# Patient Record
Sex: Male | Born: 1990 | Race: White | Hispanic: No | Marital: Single
Health system: Southern US, Community
[De-identification: ages and names within clinical notes are randomized; demographics above are authoritative.]

## PROBLEM LIST (undated history)

## (undated) DIAGNOSIS — I368 Other nonrheumatic tricuspid valve disorders: Secondary | ICD-10-CM

## (undated) DIAGNOSIS — F1120 Opioid dependence, uncomplicated: Secondary | ICD-10-CM

## (undated) DIAGNOSIS — F419 Anxiety disorder, unspecified: Secondary | ICD-10-CM

## (undated) DIAGNOSIS — R7881 Bacteremia: Secondary | ICD-10-CM

## (undated) DIAGNOSIS — I071 Rheumatic tricuspid insufficiency: Secondary | ICD-10-CM

## (undated) DIAGNOSIS — I38 Endocarditis, valve unspecified: Secondary | ICD-10-CM

## (undated) DIAGNOSIS — F32A Depression, unspecified: Secondary | ICD-10-CM

## (undated) DIAGNOSIS — B1920 Unspecified viral hepatitis C without hepatic coma: Secondary | ICD-10-CM

## (undated) DIAGNOSIS — I10 Essential (primary) hypertension: Secondary | ICD-10-CM

## (undated) HISTORY — DX: Unspecified viral hepatitis C without hepatic coma: B19.20

## (undated) HISTORY — DX: Rheumatic tricuspid insufficiency: I07.1

## (undated) HISTORY — DX: Bacteremia: R78.81

## (undated) HISTORY — PX: HERNIA REPAIR: SHX51

## (undated) HISTORY — PX: KNEE SURGERY: SHX244

## (undated) HISTORY — DX: Other nonrheumatic tricuspid valve disorders: I36.8

## (undated) HISTORY — DX: Opioid dependence, uncomplicated: F11.20

---
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
## 2016-10-16 ENCOUNTER — Emergency Department (HOSPITAL_COMMUNITY): Payer: Self-pay

## 2016-10-16 ENCOUNTER — Encounter (HOSPITAL_COMMUNITY): Payer: Self-pay

## 2016-10-16 DIAGNOSIS — R0789 Other chest pain: Secondary | ICD-10-CM | POA: Insufficient documentation

## 2016-10-16 DIAGNOSIS — D649 Anemia, unspecified: Secondary | ICD-10-CM | POA: Insufficient documentation

## 2016-10-16 DIAGNOSIS — R21 Rash and other nonspecific skin eruption: Secondary | ICD-10-CM | POA: Insufficient documentation

## 2016-10-16 DIAGNOSIS — F1110 Opioid abuse, uncomplicated: Secondary | ICD-10-CM | POA: Insufficient documentation

## 2016-10-16 LAB — CBC
HCT: 35 % — ABNORMAL LOW (ref 39.0–52.0)
HEMOGLOBIN: 11.6 g/dL — AB (ref 13.0–17.0)
MCH: 28.6 pg (ref 26.0–34.0)
MCHC: 33.1 g/dL (ref 30.0–36.0)
MCV: 86.4 fL (ref 78.0–100.0)
PLATELETS: 302 10*3/uL (ref 150–400)
RBC: 4.05 MIL/uL — AB (ref 4.22–5.81)
RDW: 14 % (ref 11.5–15.5)
WBC: 5.3 10*3/uL (ref 4.0–10.5)

## 2016-10-16 LAB — I-STAT TROPONIN, ED: TROPONIN I, POC: 0 ng/mL (ref 0.00–0.08)

## 2016-10-16 NOTE — ED Triage Notes (Addendum)
Chest pain x 1 week. Endorses sob. Pt also reports rash to abdomen. Pt reports IV heroine use about 2 hours pta

## 2016-10-17 ENCOUNTER — Emergency Department (HOSPITAL_COMMUNITY)
Admission: EM | Admit: 2016-10-17 | Discharge: 2016-10-17 | Disposition: A | Discharge status: Home / Self Care | Payer: Self-pay | Attending: Emergency Medicine | Admitting: Emergency Medicine

## 2016-10-17 DIAGNOSIS — R0789 Other chest pain: Secondary | ICD-10-CM

## 2016-10-17 DIAGNOSIS — R21 Rash and other nonspecific skin eruption: Secondary | ICD-10-CM

## 2016-10-17 DIAGNOSIS — D649 Anemia, unspecified: Secondary | ICD-10-CM

## 2016-10-17 DIAGNOSIS — F1110 Opioid abuse, uncomplicated: Secondary | ICD-10-CM

## 2016-10-17 LAB — BASIC METABOLIC PANEL
Anion gap: 9 (ref 5–15)
BUN: 9 mg/dL (ref 6–20)
CALCIUM: 9.4 mg/dL (ref 8.9–10.3)
CO2: 28 mmol/L (ref 22–32)
CREATININE: 1.05 mg/dL (ref 0.61–1.24)
Chloride: 102 mmol/L (ref 101–111)
Glucose, Bld: 67 mg/dL (ref 65–99)
Potassium: 3.7 mmol/L (ref 3.5–5.1)
SODIUM: 139 mmol/L (ref 135–145)

## 2016-10-17 MED ORDER — DEXAMETHASONE 4 MG PO TABS
10.0000 mg | ORAL_TABLET | Freq: Once | ORAL | Status: AC
  Administered 2016-10-17: 10 mg via ORAL
  Filled 2016-10-17: qty 3

## 2016-10-17 MED ORDER — IBUPROFEN 800 MG PO TABS
800.0000 mg | ORAL_TABLET | Freq: Once | ORAL | Status: AC
  Administered 2016-10-17: 800 mg via ORAL
  Filled 2016-10-17: qty 1

## 2016-10-17 NOTE — ED Notes (Signed)
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

## 2016-10-17 NOTE — ED Provider Notes (Signed)
[facility]-EMER[facility]ENCY DEPT Provider Note   CSN: [id number] Arrival date & time: 10/16/16  2306     History   Chief Complaint Chief Complaint  Patient presents with  . Chest Pain    HPI Cody Rios is a 26 y.o. male.  The history is provided by the patient.  Chest Pain    He complains of pain in the left anterior chest for about the last week. Pain is sharp and worse with movement. He denies any trauma or unusual lifting. He does lift furniture frequently, but there has been no unusual activity. Denies dyspnea, fever, cough. He broke out in a rash yesterday. The rash is present across his abdomen. He was initially pruritic, but pruritus has subsided. He denies any unusual exposures. Of note, he is an intravenous drug abuser using heroin and has been continuing to use it. He does get pain relief when he uses the heroin.  History reviewed. No pertinent past medical history.  There are no active problems to display for this patient.   No past surgical history on file.     Home Medications    Prior to Admission medications   Not on File    Family History No family history on file.  Social History Social History  Substance Use Topics  . Smoking status: Never Smoker  . Smokeless tobacco: Never Used  . Alcohol use No     Allergies   Cephalosporins and Penicillins   Review of Systems Review of Systems  Cardiovascular: Positive for chest pain.  All other systems reviewed and are negative.    Physical Exam Updated Vital Signs BP 130/89 (BP Location: Left Arm)   Pulse 71   Temp 97.8 F (36.6 C) (Oral)   Resp 16   Ht 6\' 2"  (1.88 m)   Wt 72.6 kg (160 lb)   SpO2 99%   BMI 20.54 kg/m   Physical Exam  Nursing note and vitals reviewed.  26 year old male, resting comfortably and in no acute distress. Vital signs are normal. Oxygen saturation is 99%, which is normal. Head is normocephalic and atraumatic. PERRLA, EOMI. Oropharynx is clear. Neck is nontender  and supple without adenopathy or JVD. Back is nontender and there is no CVA tenderness. Lungs are clear without rales, wheezes, or rhonchi. Chest is moderately tender in the left anterior chest wall. There is no point tenderness. There is no crepitus. Heart has regular rate and rhythm without murmur. Abdomen is soft, flat, nontender without masses or hepatosplenomegaly and peristalsis is normoactive. Extremities have no cyanosis or edema, full range of motion is present. Skin is warm and dry. Erythematous papular rash is present across the upper abdomen. Appearance is nonspecific. Neurologic: Mental status is normal, cranial nerves are intact, there are no motor or sensory deficits.  ED Treatments / Results  Labs (all labs ordered are listed, but only abnormal results are displayed) Labs Reviewed  CBC - Abnormal; Notable for the following:       Result Value   RBC 4.05 (*)    Hemoglobin 11.6 (*)    HCT 35.0 (*)    All other components within normal limits  BASIC METABOLIC PANEL  I-STAT TROPOININ, ED    EKG  EKG Interpretation  Date/Time:  Sunday October 16 2016 23:11:18 EDT Ventricular Rate:  82 PR Interval:  118 QRS Duration: 94 QT Interval:  376 QTC Calculation: 439 R Axis:   88 Text Interpretation:  Normal sinus rhythm Incomplete right bundle branch block Left  ventricular hypertrophy Abnormal ECG No old tracing to compare Confirmed by Dione BoozeGlick, Jennefer Kopp ([id number]) on 10/16/2016 11:40:00 PM       Radiology Dg Chest 2 View  Result Date: 10/16/2016 CLINICAL DATA:  26 year old male with chest pain. EXAM: CHEST  2 VIEW COMPARISON:  Chest radiograph dated 03/21/2005 FINDINGS: The heart size and mediastinal contours are within normal limits. Both lungs are clear. The visualized skeletal structures are unremarkable. IMPRESSION: No active cardiopulmonary disease. Electronically Signed   By: Elgie CollardArash  Radparvar M.D.   On: 10/16/2016 23:29    Procedures Procedures (including critical care  time)  Medications Ordered in ED Medications  dexamethasone (DECADRON) tablet 10 mg (not administered)  ibuprofen (ADVIL,MOTRIN) tablet 800 mg (not administered)     Initial Impression / Assessment and Plan / ED Course  I have reviewed the triage vital signs and the nursing notes.  Pertinent labs & imaging results that were available during my care of the patient were reviewed by me and considered in my medical decision making (see chart for details).  Chest pain which is clearly musculoskeletal in origin. No red flags to suggest more serious causes. Rash is nonspecific. I suspect this is a viral exanthem. Laboratory workup did show mild anemia. On review of records, in Care Everywhere, hemoglobin was noted to be [facility] at [facility] one year ago. He is advised using over-the-counter naproxen. He is given a single dose of dexamethasone. Given history of drug abuse, no narcotics are given. He is given Psychologist, clinicalresource guides for financial assistance, and drug rehabilitation programs.  Final Clinical Impressions(s) / ED Diagnoses   Final diagnoses:  Chest wall pain  Papular rash  Heroin abuse  Normochromic normocytic anemia    New Prescriptions New Prescriptions   No medications on file     Dione BoozeGlick, Korin Hartwell, MD 10/17/16 0210

## 2016-10-17 NOTE — ED Notes (Signed)
Patient was called 2x with out answer.  Patients friend walked in from outside stated he was outside for a minute and would be back in a few minutes.

## 2016-10-17 NOTE — Discharge Instructions (Signed)
Take naproxen - two tablets at a time, twice a day. Use ice and/or heat as needed. Do not use acetaminophen (Tylenol) because it can harm your liver. Return if symptoms are getting worse.

## 2016-11-04 ENCOUNTER — Encounter (HOSPITAL_COMMUNITY): Payer: Self-pay | Admitting: Emergency Medicine

## 2016-11-04 ENCOUNTER — Emergency Department (HOSPITAL_COMMUNITY): Payer: Self-pay

## 2016-11-04 ENCOUNTER — Emergency Department (HOSPITAL_COMMUNITY)
Admission: EM | Admit: 2016-11-04 | Discharge: 2016-11-04 | Disposition: A | Discharge status: Home / Self Care | Payer: Self-pay | Attending: Emergency Medicine | Admitting: Emergency Medicine

## 2016-11-04 DIAGNOSIS — R079 Chest pain, unspecified: Secondary | ICD-10-CM | POA: Insufficient documentation

## 2016-11-04 DIAGNOSIS — R911 Solitary pulmonary nodule: Secondary | ICD-10-CM | POA: Insufficient documentation

## 2016-11-04 LAB — BASIC METABOLIC PANEL
ANION GAP: 9 (ref 5–15)
BUN: 14 mg/dL (ref 6–20)
CALCIUM: 8.4 mg/dL — AB (ref 8.9–10.3)
CO2: 27 mmol/L (ref 22–32)
CREATININE: 0.85 mg/dL (ref 0.61–1.24)
Chloride: 97 mmol/L — ABNORMAL LOW (ref 101–111)
GFR calc Af Amer: >60 mL/min (ref >60)
GLUCOSE: 158 mg/dL — AB (ref 65–99)
Potassium: 3.9 mmol/L (ref 3.5–5.1)
Sodium: 133 mmol/L — ABNORMAL LOW (ref 135–145)

## 2016-11-04 LAB — I-STAT TROPONIN, ED: TROPONIN I, POC: 0 ng/mL (ref 0.00–0.08)

## 2016-11-04 LAB — CBC
HCT: 36 % — ABNORMAL LOW (ref 39.0–52.0)
HEMOGLOBIN: 12 g/dL — AB (ref 13.0–17.0)
MCH: 28.3 pg (ref 26.0–34.0)
MCHC: 33.3 g/dL (ref 30.0–36.0)
MCV: 84.9 fL (ref 78.0–100.0)
PLATELETS: 178 10*3/uL (ref 150–400)
RBC: 4.24 MIL/uL (ref 4.22–5.81)
RDW: 14.4 % (ref 11.5–15.5)
WBC: 9.7 10*3/uL (ref 4.0–10.5)

## 2016-11-04 LAB — D-DIMER, QUANTITATIVE: D-Dimer, Quant: 1.76 ug/mL-FEU — ABNORMAL HIGH (ref 0.00–0.50)

## 2016-11-04 MED ORDER — IBUPROFEN 800 MG PO TABS
800.0000 mg | ORAL_TABLET | Freq: Once | ORAL | Status: AC
  Administered 2016-11-04: 800 mg via ORAL
  Filled 2016-11-04: qty 1

## 2016-11-04 MED ORDER — KETOROLAC TROMETHAMINE 30 MG/ML IJ SOLN
15.0000 mg | Freq: Once | INTRAMUSCULAR | Status: AC
  Administered 2016-11-04: 15 mg via INTRAVENOUS
  Filled 2016-11-04: qty 1

## 2016-11-04 MED ORDER — AZITHROMYCIN 250 MG PO TABS: ORAL_TABLET | ORAL | 0 refills | Status: DC

## 2016-11-04 MED ORDER — LORAZEPAM 2 MG/ML IJ SOLN
1.0000 mg | Freq: Once | INTRAMUSCULAR | Status: AC
  Administered 2016-11-04: 1 mg via INTRAVENOUS
  Filled 2016-11-04: qty 1

## 2016-11-04 MED ORDER — IOPAMIDOL (ISOVUE-370) INJECTION 76%
60.0000 mL | Freq: Once | INTRAVENOUS | Status: AC | PRN
  Administered 2016-11-04: 60 mL via INTRAVENOUS

## 2016-11-04 MED ORDER — NAPROXEN 500 MG PO TABS: 500.0000 mg | ORAL_TABLET | Freq: Two times a day (BID) | ORAL | 0 refills | Status: DC

## 2016-11-04 MED ORDER — IOPAMIDOL (ISOVUE-370) INJECTION 76%
INTRAVENOUS | Status: AC
  Filled 2016-11-04: qty 100

## 2016-11-04 NOTE — ED Provider Notes (Signed)
Pt was seen by Dr Bebe ShaggyWickline initially. Please see his note.  Patient's CT scan results show a 7 mm nodule most likely infectious or inflammatory. Follow-up CT scan recommended in 6-12 months. No signs of pulmonary embolism, aneurysm or dissection.  Pt states he did have a fever last night.  Will rx abx.  Discussed need for repeat CT scan imaging.   Cody Rios, Cody Mcnamee, MD 11/04/16 [phone]

## 2016-11-04 NOTE — ED Notes (Signed)
Patient A/O x4 and ambulatory upon depature. Patient verbalized understanding of discharge instructions, follow up care, and medications. All belongings with patient upon departure.

## 2016-11-04 NOTE — ED Notes (Signed)
Patient transported to CT 

## 2016-11-04 NOTE — ED Notes (Signed)
RN attempted three IV stick, not successful

## 2016-11-04 NOTE — Discharge Instructions (Signed)
Take the medications for the pain and possible infection, follow up with a primary care doctor next week.  As we discussed, A lung nodule was seen on the CT scan.  The radiologist recommends a repeat CT scan in 6-12 months to make sure it is not growing in size.

## 2016-11-04 NOTE — ED Notes (Signed)
Pt updates, now resting quietly.

## 2016-11-04 NOTE — ED Notes (Signed)
Pt returned to room from xray.

## 2016-11-04 NOTE — ED Provider Notes (Signed)
[facility]PT Provider Note   CSN: [id number] Arrival date & time: 11/04/16  0407     History   Chief Complaint Chief Complaint  Patient presents with  . Shortness of Breath  . Chest Pain    HPI Cody Rios is a 26 y.o. male.  The history is provided by the patient.  Chest Pain   This is a new problem. The current episode started 6 to 12 hours ago. The problem occurs constantly. The problem has been gradually worsening. The pain is associated with breathing, movement and raising an arm. The pain is present in the substernal region. The pain is moderate. The quality of the pain is described as sharp. The pain does not radiate. Associated symptoms include shortness of breath. Pertinent negatives include no cough, no fever and no vomiting. Treatments tried: NSAIDs.  patient presents with chest pain He reports starting last night he had onset of chest pain ,worse with breathing and also palpation No fever/vomiting He report SOB No cough No trauma  He admits to heroin IVDA, last use just prior to CP starting No h/o PE  PMH - heroin abuse Past Surgical History:  Procedure Laterality Date  . HERNIA REPAIR    . KNEE SURGERY         Home Medications    Prior to Admission medications   Not on File    Family History History reviewed. No pertinent family history.  Social History Social History  Substance Use Topics  . Smoking status: Never Smoker  . Smokeless tobacco: Never Used  . Alcohol use No     Allergies   Cephalosporins and Penicillins   Review of Systems Review of Systems  Constitutional: Negative for fever.  Respiratory: Positive for shortness of breath. Negative for cough.   Cardiovascular: Positive for chest pain. Negative for leg swelling.  Gastrointestinal: Negative for vomiting.  Psychiatric/Behavioral: The patient is nervous/anxious.   All other systems reviewed and are negative.    Physical Exam Updated Vital Signs BP  129/78 (BP Location: Right Arm)   Pulse (!) 107   Temp 98.1 F (36.7 C) (Oral)   Resp 19   Ht 1.88 m (6\' 2" )   Wt 72.6 kg (160 lb)   SpO2 100%   BMI 20.54 kg/m   Physical Exam CONSTITUTIONAL: Anxious HEAD: Normocephalic/atraumatic EYES: EOMI/PERRL ENMT: Mucous membranes moist NECK: supple no meningeal signs SPINE/BACK:entire spine nontender CV: S1/S2 noted, no murmurs/rubs/gallops noted, no loud/harsh murmurs noted, tachycardic LUNGS: Lungs are clear to auscultation bilaterally, no apparent distress Chest- diffuse chest wall tenderness, no bruising or crepitus noted ABDOMEN: soft, nontender NEURO: Pt is awake/alert/appropriate, moves all extremitiesx4.  No facial droop.   EXTREMITIES: pulses normal/equal, full ROM, no LE edema noted SKIN: warm, color normal PSYCH: anxious  ED Treatments / Results  Labs (all labs ordered are listed, but only abnormal results are displayed) Labs Reviewed  CBC - Abnormal; Notable for the following:       Result Value   Hemoglobin 12.0 (*)    HCT 36.0 (*)    All other components within normal limits  BASIC METABOLIC PANEL  D-DIMER, QUANTITATIVE (NOT AT [facility])  I-STAT TROPONIN, ED    EKG  EKG Interpretation  Date/Time:  Friday November 04 2016 04:12:39 EDT Ventricular Rate:  100 PR Interval:    QRS Duration: 100 QT Interval:  320 QTC Calculation: 413 R Axis:   90 Text Interpretation:  Sinus tachycardia Consider right ventricular hypertrophy Probable left ventricular hypertrophy Confirmed  by Zadie RhineWickline, Adair Lauderback [phone]([id number]) on 11/04/2016 4:18:19 AM       Radiology Dg Chest 2 View  Result Date: 11/04/2016 CLINICAL DATA:  Chest pain after IV drug use at 10 p.m. EXAM: CHEST  2 VIEW COMPARISON:  10/16/2016 FINDINGS: Shallow inspiration. The heart size and mediastinal contours are within normal limits. Both lungs are clear. The visualized skeletal structures are unremarkable. IMPRESSION: No active cardiopulmonary disease. Electronically Signed   By:  Burman NievesWilliam  Stevens M.D.   On: 11/04/2016 04:50    Procedures Procedures (including critical care time)  Medications Ordered in ED Medications  ibuprofen (ADVIL,MOTRIN) tablet 800 mg (800 mg Oral Given 11/04/16 0523)  ketorolac (TORADOL) 30 MG/ML injection 15 mg (15 mg Intravenous Given 11/04/16 0631)  LORazepam (ATIVAN) injection 1 mg (1 mg Intravenous Given 11/04/16 0641)     Initial Impression / Assessment and Plan / ED Course  I have reviewed the triage vital signs and the nursing notes.  Pertinent labs & imaging results that were available during my care of the patient were reviewed by me and considered in my medical decision making (see chart for details).     5:16 AM Pt with known h/o IVDA presents with CP It is reproducible, however pt is clearly in pain and unreliable He is tachycardic He had recent ED evaluation Will proceed with D-dimer to evaluate for PE 7:19 AM D-dimer elevated Pt tachycardic Will proceed with CT chest D/w dr Cletis Athensjon knapp at signout If CT imaging negative he may be discharged with resources for outpatient detox   Final Clinical Impressions(s) / ED Diagnoses   Final diagnoses:  None    New Prescriptions New Prescriptions   No medications on file     Zadie RhineWickline, Rivers Hamrick, MD 11/04/16 [phone]

## 2016-11-04 NOTE — ED Triage Notes (Signed)
Pt c/o 10/10 left side cp since last night at 22:00, heroine abuser, last use 2130 last night, states he is having cp and feeling cold all over.

## 2016-11-07 ENCOUNTER — Encounter (HOSPITAL_COMMUNITY): Payer: Self-pay | Admitting: Emergency Medicine

## 2016-11-07 ENCOUNTER — Emergency Department (HOSPITAL_COMMUNITY): Payer: Self-pay

## 2016-11-07 ENCOUNTER — Emergency Department (HOSPITAL_COMMUNITY)
Admission: EM | Admit: 2016-11-07 | Discharge: 2016-11-08 | Disposition: A | Discharge status: Home / Self Care | Payer: Self-pay | Attending: Emergency Medicine | Admitting: Emergency Medicine

## 2016-11-07 DIAGNOSIS — R609 Edema, unspecified: Secondary | ICD-10-CM | POA: Insufficient documentation

## 2016-11-07 DIAGNOSIS — R0789 Other chest pain: Secondary | ICD-10-CM | POA: Insufficient documentation

## 2016-11-07 DIAGNOSIS — R072 Precordial pain: Secondary | ICD-10-CM | POA: Insufficient documentation

## 2016-11-07 LAB — CBC
HCT: 31.4 % — ABNORMAL LOW (ref 39.0–52.0)
HEMOGLOBIN: 11.1 g/dL — AB (ref 13.0–17.0)
MCH: 29.1 pg (ref 26.0–34.0)
MCHC: 35.4 g/dL (ref 30.0–36.0)
MCV: 82.4 fL (ref 78.0–100.0)
PLATELETS: 193 10*3/uL (ref 150–400)
RBC: 3.81 MIL/uL — AB (ref 4.22–5.81)
RDW: 14.8 % (ref 11.5–15.5)
WBC: 12.9 10*3/uL — AB (ref 4.0–10.5)

## 2016-11-07 LAB — BASIC METABOLIC PANEL
ANION GAP: 9 (ref 5–15)
BUN: 15 mg/dL (ref 6–20)
CALCIUM: 8.6 mg/dL — AB (ref 8.9–10.3)
CHLORIDE: 99 mmol/L — AB (ref 101–111)
CO2: 26 mmol/L (ref 22–32)
CREATININE: 0.66 mg/dL (ref 0.61–1.24)
GFR calc non Af Amer: >60 mL/min (ref >60)
Glucose, Bld: 117 mg/dL — ABNORMAL HIGH (ref 65–99)
Potassium: 3.7 mmol/L (ref 3.5–5.1)
SODIUM: 134 mmol/L — AB (ref 135–145)

## 2016-11-07 LAB — POCT I-STAT TROPONIN I: TROPONIN I, POC: 0 ng/mL (ref 0.00–0.08)

## 2016-11-07 NOTE — ED Notes (Signed)
Pt refused chest x-ray

## 2016-11-07 NOTE — ED Triage Notes (Addendum)
Pt c/o chest pain, bilateral pedal edema, bilateral numbness to toes, intermittent SOB and palpitations onset 2 days ago, better today than 2 days ago. Current IV drug user, last use of heroine IV was at 1630.   Pt [facility] for [facility], was diagnosed with chest wall pain. Was also seen in ED 2 days ago for left CP, diagnosed with pleurisy.  Pain worse with movement, not changed with position between sitting back or leaning forward. S1, S2 without murmur, rub, gallop. No lesions to palms or nails.

## 2016-11-08 ENCOUNTER — Telehealth (HOSPITAL_BASED_OUTPATIENT_CLINIC_OR_DEPARTMENT_OTHER): Payer: Self-pay | Admitting: Emergency Medicine

## 2016-11-08 ENCOUNTER — Telehealth: Payer: Self-pay | Admitting: Infectious Disease

## 2016-11-08 DIAGNOSIS — F17220 Nicotine dependence, chewing tobacco, uncomplicated: Secondary | ICD-10-CM | POA: Diagnosis present

## 2016-11-08 DIAGNOSIS — R7881 Bacteremia: Secondary | ICD-10-CM | POA: Diagnosis present

## 2016-11-08 DIAGNOSIS — F1120 Opioid dependence, uncomplicated: Secondary | ICD-10-CM | POA: Diagnosis present

## 2016-11-08 DIAGNOSIS — Z881 Allergy status to other antibiotic agents status: Secondary | ICD-10-CM

## 2016-11-08 DIAGNOSIS — M25531 Pain in right wrist: Secondary | ICD-10-CM | POA: Diagnosis present

## 2016-11-08 DIAGNOSIS — I272 Pulmonary hypertension, unspecified: Secondary | ICD-10-CM | POA: Diagnosis present

## 2016-11-08 DIAGNOSIS — M659 Synovitis and tenosynovitis, unspecified: Secondary | ICD-10-CM | POA: Diagnosis present

## 2016-11-08 DIAGNOSIS — B9562 Methicillin resistant Staphylococcus aureus infection as the cause of diseases classified elsewhere: Secondary | ICD-10-CM | POA: Diagnosis present

## 2016-11-08 DIAGNOSIS — Z791 Long term (current) use of non-steroidal anti-inflammatories (NSAID): Secondary | ICD-10-CM

## 2016-11-08 DIAGNOSIS — Z880 Allergy status to penicillin: Secondary | ICD-10-CM

## 2016-11-08 DIAGNOSIS — M25561 Pain in right knee: Secondary | ICD-10-CM | POA: Diagnosis present

## 2016-11-08 DIAGNOSIS — I2690 Septic pulmonary embolism without acute cor pulmonale: Secondary | ICD-10-CM | POA: Diagnosis present

## 2016-11-08 DIAGNOSIS — R5381 Other malaise: Secondary | ICD-10-CM | POA: Diagnosis present

## 2016-11-08 DIAGNOSIS — J189 Pneumonia, unspecified organism: Secondary | ICD-10-CM | POA: Diagnosis present

## 2016-11-08 DIAGNOSIS — M0800 Unspecified juvenile rheumatoid arthritis of unspecified site: Secondary | ICD-10-CM | POA: Diagnosis present

## 2016-11-08 DIAGNOSIS — I330 Acute and subacute infective endocarditis: Principal | ICD-10-CM | POA: Diagnosis present

## 2016-11-08 LAB — BLOOD CULTURE ID PANEL (REFLEXED)

## 2016-11-08 MED ORDER — IBUPROFEN 800 MG PO TABS: 800.0000 mg | ORAL_TABLET | Freq: Three times a day (TID) | ORAL | 0 refills | Status: DC | PRN

## 2016-11-08 NOTE — Discharge Instructions (Signed)
You will need to follow-up with the clinic provided.  Return here as needed.  Your testing here tonight did not show any significant abnormalities.  We did draw blood cultures, which we will call you if there is any abnormalities

## 2016-11-08 NOTE — Telephone Encounter (Signed)
Patient with MSSAB  He needs to be admitted to the hospital  GIven his cephalosporin and penicllin allergies I would investigate nature of these and if felt safe would prefer to treat him with beta lactam such as Cefazolin (his reaction is listed as N and V)  If not felt safe would use vancomycin  I have added him to the [facility] and [facility] Drue SecondSNider will round on him tomorrow  IF he comes to [facility]        Antimicrobial Management Team Staphylococcus aureus bacteremia   Staphylococcus aureus bacteremia (SAB) is associated with a high rate of complications and mortality.  Specific aspects of clinical management are critical to optimizing the outcome of patients with SAB.  Therefore, the [facility] Antimicrobial Management Team [facility]) has initiated an intervention aimed at improving the management of SAB at [facility].  To do so, Infectious Diseases physicians are providing an evidence-based consult for the management of all patients with SAB.     Yes No Comments  Perform follow-up blood cultures (even if the patient is afebrile) to ensure clearance of bacteremia [x]  []  Start after 24 hours of being on effective abx  Remove vascular catheter and obtain follow-up blood cultures after the removal of the catheter [x]  []    Perform echocardiography to evaluate for endocarditis (transthoracic ECHO is 40-50% sensitive, TEE is > 90% sensitive) [x]  []  Please keep in mind, that neither test can definitively EXCLUDE endocarditis, and that should clinical suspicion remain high for endocarditis the patient should then still be treated with an "endocarditis" duration of therapy = 6 weeks  Consult electrophysiologist to evaluate implanted cardiac device (pacemaker, ICD) []  []    Ensure source control [x]  []  Have all abscesses been drained effectively? Have deep seeded infections (septic joints or osteomyelitis) had appropriate surgical  debridement?  Investigate for "metastatic" sites of infection [x]  []  Does the patient have ANY symptom or physical exam finding that would suggest a deeper infection (back or neck pain that may be suggestive of vertebral osteomyelitis or epidural abscess, muscle pain that could be a symptom of pyomyositis)?  Keep in mind that for deep seeded infections MRI imaging with contrast is preferred rather than other often insensitive tests such as plain x-rays, especially early in a patient's presentation.  Change antibiotic therapy to cefazolin IF possible otherwise IV vancomycin [x]  []  Beta-lactam antibiotics are preferred for MSSA due to higher cure rates.   If on Vancomycin, goal trough should be 15 - 20 mcg/mL  Estimated duration of IV antibiotic therapy:  2-6 weeks []  []  Consult case management for probably prolonged outpatient IV antibiotic therapy

## 2016-11-08 NOTE — Telephone Encounter (Signed)
Pt returned call, 2 pt identifiers used, pt made aware of blood culture that was positive for staph. Pt instructed to go to ED promptly for evaluation. Pt verbalized understanding.

## 2016-11-08 NOTE — ED Provider Notes (Addendum)
[facility]-EMERGENCY DEPT Provider Note   CSN: [id number] Arrival date & time: 11/07/16  1741     History   Chief Complaint Chief Complaint  Patient presents with  . Chest Pain  . Leg Swelling  . Numbness    HPI Cody Rios is a 26 y.o. male.  HPI Patient presents to the emergency department with Chest discomfort that has been worsening over the last few weeks.  Patient states he was seen at [facility] Saturday where they did a CT scan of his chest and laboratory testing.  He says he is discharged home with Zithromax.  The patient states that he is an IV drug user, last usage was tonight around 8 PM.  Patient also had intermittent arthralgias and different joints along with some swelling in his ankles.  He states she has also had intermittent fevers.  The patient denies shortness of breath, headache,blurred vision, neck pain, cough, weakness, numbness, dizziness, anorexia, edema, abdominal pain, nausea, vomiting, diarrhea, rash, back pain, dysuria, hematemesis, bloody stool, near syncope, or syncope. History reviewed. No pertinent past medical history.  There are no active problems to display for this patient.   Past Surgical History:  Procedure Laterality Date  . HERNIA REPAIR    . KNEE SURGERY         Home Medications    Prior to Admission medications   Medication Sig Start Date End Date Taking? Authorizing Provider  azithromycin (ZITHROMAX) 250 MG tablet Take first 2 tablets together, then 1 every day until finished. 11/04/16   Linwood Dibbles, MD  naproxen (NAPROSYN) 500 MG tablet Take 1 tablet (500 mg total) by mouth 2 (two) times daily. 11/04/16   Linwood Dibbles, MD    Family History History reviewed. No pertinent family history.  Social History Social History  Substance Use Topics  . Smoking status: Never Smoker  . Smokeless tobacco: Never Used  . Alcohol use No     Allergies   Cephalosporins and Penicillins   Review of Systems Review of Systems  All other  systems negative except as documented in the HPI. All pertinent positives and negatives as reviewed in the HPI. Physical Exam Updated Vital Signs BP 101/76 (BP Location: Right Arm)   Pulse (!) 105   Temp 98.6 F (37 C) (Oral)   Resp 12   Ht 6\' 2"  (1.88 m)   Wt 70.3 kg (155 lb)   SpO2 96%   BMI 19.90 kg/m   Physical Exam  Constitutional: He is oriented to person, place, and time. He appears well-developed and well-nourished. No distress.  HENT:  Head: Normocephalic and atraumatic.  Mouth/Throat: Oropharynx is clear and moist.  Eyes: Pupils are equal, round, and reactive to light.  Neck: Normal range of motion. Neck supple.  Cardiovascular: Normal rate, regular rhythm and normal heart sounds.  Exam reveals no gallop and no friction rub.   No murmur heard. Pulmonary/Chest: Effort normal and breath sounds normal. No respiratory distress. He has no wheezes.  Abdominal: Soft. Bowel sounds are normal. He exhibits no distension. There is no tenderness.  Musculoskeletal:       Hands: Neurological: He is alert and oriented to person, place, and time. He exhibits normal muscle tone. Coordination normal.  Skin: Skin is warm and dry. Capillary refill takes less than 2 seconds. No rash noted. No erythema.  Psychiatric: He has a normal mood and affect. His behavior is normal.  Nursing note and vitals reviewed.    ED Treatments / Results  Labs (  all labs ordered are listed, but only abnormal results are displayed) Labs Reviewed  BASIC METABOLIC PANEL - Abnormal; Notable for the following:       Result Value   Sodium 134 (*)    Chloride 99 (*)    Glucose, Bld 117 (*)    Calcium 8.6 (*)    All other components within normal limits  CBC - Abnormal; Notable for the following:    WBC 12.9 (*)    RBC 3.81 (*)    Hemoglobin 11.1 (*)    HCT 31.4 (*)    All other components within normal limits  CULTURE, BLOOD (ROUTINE X 2)  CULTURE, BLOOD (ROUTINE X 2)  I-STAT TROPONIN, ED  POCT I-STAT  TROPONIN I    EKG  EKG Interpretation  Date/Time:  Monday November 07 2016 17:56:25 EDT Ventricular Rate:  113 PR Interval:    QRS Duration: 93 QT Interval:  309 QTC Calculation: 424 R Axis:   92 Text Interpretation:  Sinus tachycardia Consider right ventricular hypertrophy LVH by voltage No significant change since last tracing Confirmed by Shaune PollackIsaacs, Cameron [phone]([id number] on 11/08/2016 12:10:44 AM       Radiology No results found.  Procedures Procedures (including critical care time)  Medications Ordered in ED Medications - No data to display   Initial Impression / Assessment and Plan / ED Course  I have reviewed the triage vital signs and the nursing notes.  Pertinent labs & imaging results that were available during my care of the patient were reviewed by me and considered in my medical decision making (see chart for details).    Patient other than tachycardia has been stable here in the emergency department.  He is tachycardic on his previous visit as well.  There is no new murmur at this time.  Patient is afebrile.  Patient is tolerating oral fluids.  I did send blood cultures due to the fact that there is a chance that he could have endocarditis, but at this time does not show any signs of significant impairment. I felt possible admission was warranted due to his tachycardia and IV drug use awaiting cultures. I spoke with the Hospitalist for admission and he felt at this time admission was not warranted and discharge home was appropriate waiting for cultures and outpatient follow up.  Final Clinical Impressions(s) / ED Diagnoses   Final diagnoses:  None    New Prescriptions New Prescriptions   No medications on file     [name], Malaijah Houchen, Cordelia Poche-C 11/08/16 0044    [name], Brylie Sneath, PA-C 11/08/16 [phone]    Shaune PollackIsaacs, Cameron, MD 11/08/16 1120    Shaune PollackIsaacs, Cameron, MD 11/09/16 1115    [name], Sunshyne Horvath, PA-C 11/09/16 [phone]

## 2016-11-09 ENCOUNTER — Inpatient Hospital Stay (HOSPITAL_COMMUNITY): Payer: Self-pay

## 2016-11-09 ENCOUNTER — Emergency Department (HOSPITAL_COMMUNITY): Payer: Self-pay

## 2016-11-09 ENCOUNTER — Other Ambulatory Visit: Payer: Self-pay

## 2016-11-09 ENCOUNTER — Inpatient Hospital Stay (HOSPITAL_COMMUNITY)
Admission: EM | Admit: 2016-11-09 | Discharge: 2016-11-09 | DRG: 288 | Discharge status: Against Medical Advice | Payer: Self-pay | Attending: Internal Medicine | Admitting: Internal Medicine

## 2016-11-09 ENCOUNTER — Encounter (HOSPITAL_COMMUNITY): Payer: Self-pay | Admitting: Emergency Medicine

## 2016-11-09 ENCOUNTER — Encounter (HOSPITAL_COMMUNITY): Payer: Self-pay | Admitting: Family Medicine

## 2016-11-09 DIAGNOSIS — I330 Acute and subacute infective endocarditis: Principal | ICD-10-CM

## 2016-11-09 DIAGNOSIS — J189 Pneumonia, unspecified organism: Secondary | ICD-10-CM

## 2016-11-09 DIAGNOSIS — M65831 Other synovitis and tenosynovitis, right forearm: Secondary | ICD-10-CM | POA: Diagnosis present

## 2016-11-09 DIAGNOSIS — M659 Synovitis and tenosynovitis, unspecified: Secondary | ICD-10-CM

## 2016-11-09 DIAGNOSIS — Z6820 Body mass index (BMI) 20.0-20.9, adult: Secondary | ICD-10-CM

## 2016-11-09 DIAGNOSIS — D649 Anemia, unspecified: Secondary | ICD-10-CM | POA: Diagnosis present

## 2016-11-09 DIAGNOSIS — B1920 Unspecified viral hepatitis C without hepatic coma: Secondary | ICD-10-CM | POA: Diagnosis present

## 2016-11-09 DIAGNOSIS — B9561 Methicillin susceptible Staphylococcus aureus infection as the cause of diseases classified elsewhere: Secondary | ICD-10-CM

## 2016-11-09 DIAGNOSIS — I76 Septic arterial embolism: Secondary | ICD-10-CM | POA: Diagnosis present

## 2016-11-09 DIAGNOSIS — N390 Urinary tract infection, site not specified: Secondary | ICD-10-CM | POA: Diagnosis present

## 2016-11-09 DIAGNOSIS — Z880 Allergy status to penicillin: Secondary | ICD-10-CM

## 2016-11-09 DIAGNOSIS — I272 Pulmonary hypertension, unspecified: Secondary | ICD-10-CM | POA: Diagnosis present

## 2016-11-09 DIAGNOSIS — E46 Unspecified protein-calorie malnutrition: Secondary | ICD-10-CM | POA: Diagnosis present

## 2016-11-09 DIAGNOSIS — Z79899 Other long term (current) drug therapy: Secondary | ICD-10-CM

## 2016-11-09 DIAGNOSIS — I071 Rheumatic tricuspid insufficiency: Secondary | ICD-10-CM | POA: Diagnosis present

## 2016-11-09 DIAGNOSIS — F17220 Nicotine dependence, chewing tobacco, uncomplicated: Secondary | ICD-10-CM | POA: Diagnosis present

## 2016-11-09 DIAGNOSIS — M868X7 Other osteomyelitis, ankle and foot: Secondary | ICD-10-CM | POA: Diagnosis present

## 2016-11-09 DIAGNOSIS — M0800 Unspecified juvenile rheumatoid arthritis of unspecified site: Secondary | ICD-10-CM | POA: Diagnosis present

## 2016-11-09 DIAGNOSIS — Z888 Allergy status to other drugs, medicaments and biological substances status: Secondary | ICD-10-CM

## 2016-11-09 DIAGNOSIS — Z881 Allergy status to other antibiotic agents status: Secondary | ICD-10-CM

## 2016-11-09 DIAGNOSIS — J188 Other pneumonia, unspecified organism: Secondary | ICD-10-CM | POA: Diagnosis present

## 2016-11-09 DIAGNOSIS — I10 Essential (primary) hypertension: Secondary | ICD-10-CM | POA: Diagnosis present

## 2016-11-09 DIAGNOSIS — R7881 Bacteremia: Secondary | ICD-10-CM | POA: Diagnosis present

## 2016-11-09 DIAGNOSIS — I2690 Septic pulmonary embolism without acute cor pulmonale: Secondary | ICD-10-CM | POA: Diagnosis present

## 2016-11-09 DIAGNOSIS — F1123 Opioid dependence with withdrawal: Secondary | ICD-10-CM | POA: Diagnosis present

## 2016-11-09 DIAGNOSIS — R52 Pain, unspecified: Secondary | ICD-10-CM

## 2016-11-09 LAB — BASIC METABOLIC PANEL
ANION GAP: 7 (ref 5–15)
BUN: 6 mg/dL (ref 6–20)
CHLORIDE: 101 mmol/L (ref 101–111)
CO2: 25 mmol/L (ref 22–32)
CREATININE: 0.59 mg/dL — AB (ref 0.61–1.24)
Calcium: 8 mg/dL — ABNORMAL LOW (ref 8.9–10.3)
GFR calc non Af Amer: >60 mL/min (ref >60)
Glucose, Bld: 115 mg/dL — ABNORMAL HIGH (ref 65–99)
POTASSIUM: 4 mmol/L (ref 3.5–5.1)
Sodium: 133 mmol/L — ABNORMAL LOW (ref 135–145)

## 2016-11-09 LAB — CBC WITH DIFFERENTIAL/PLATELET
BASOS PCT: 0 %
Basophils Absolute: 0 10*3/uL (ref 0.0–0.1)
EOS ABS: 0.2 10*3/uL (ref 0.0–0.7)
Eosinophils Relative: 1 %
HCT: 27.9 % — ABNORMAL LOW (ref 39.0–52.0)
HEMOGLOBIN: 9.9 g/dL — AB (ref 13.0–17.0)
Lymphocytes Relative: 14 %
Lymphs Abs: 2.3 10*3/uL (ref 0.7–4.0)
MCH: 29.5 pg (ref 26.0–34.0)
MCHC: 35.5 g/dL (ref 30.0–36.0)
MCV: 83 fL (ref 78.0–100.0)
MONOS PCT: 12 %
Monocytes Absolute: 1.9 10*3/uL — ABNORMAL HIGH (ref 0.1–1.0)
NEUTROS PCT: 73 %
Neutro Abs: 12 10*3/uL — ABNORMAL HIGH (ref 1.7–7.7)
Platelets: 189 10*3/uL (ref 150–400)
RBC: 3.36 MIL/uL — ABNORMAL LOW (ref 4.22–5.81)
RDW: 15.3 % (ref 11.5–15.5)
WBC: 16.4 10*3/uL — AB (ref 4.0–10.5)

## 2016-11-09 LAB — URINALYSIS, ROUTINE W REFLEX MICROSCOPIC
Bilirubin Urine: NEGATIVE
Glucose, UA: NEGATIVE mg/dL
KETONES UR: NEGATIVE mg/dL
Leukocytes, UA: NEGATIVE
Nitrite: POSITIVE — AB
Protein, ur: 100 mg/dL — AB
SQUAMOUS EPITHELIAL / LPF: NONE SEEN
Specific Gravity, Urine: 1.024 (ref 1.005–1.030)
pH: 5 (ref 5.0–8.0)

## 2016-11-09 LAB — COMPREHENSIVE METABOLIC PANEL
ALBUMIN: 2.7 g/dL — AB (ref 3.5–5.0)
ALT: 52 U/L (ref 17–63)
ANION GAP: 6 (ref 5–15)
AST: 44 U/L — ABNORMAL HIGH (ref 15–41)
Alkaline Phosphatase: 210 U/L — ABNORMAL HIGH (ref 38–126)
BUN: 10 mg/dL (ref 6–20)
CHLORIDE: 101 mmol/L (ref 101–111)
CO2: 28 mmol/L (ref 22–32)
Calcium: 8.2 mg/dL — ABNORMAL LOW (ref 8.9–10.3)
Creatinine, Ser: 0.64 mg/dL (ref 0.61–1.24)
GFR calc Af Amer: >60 mL/min (ref >60)
GFR calc non Af Amer: >60 mL/min (ref >60)
Glucose, Bld: 113 mg/dL — ABNORMAL HIGH (ref 65–99)
POTASSIUM: 3.7 mmol/L (ref 3.5–5.1)
SODIUM: 135 mmol/L (ref 135–145)
Total Bilirubin: 1.2 mg/dL (ref 0.3–1.2)
Total Protein: 5.8 g/dL — ABNORMAL LOW (ref 6.5–8.1)

## 2016-11-09 LAB — CBC
HEMATOCRIT: 29.1 % — AB (ref 39.0–52.0)
HEMOGLOBIN: 9.9 g/dL — AB (ref 13.0–17.0)
MCH: 28.1 pg (ref 26.0–34.0)
MCHC: 34 g/dL (ref 30.0–36.0)
MCV: 82.7 fL (ref 78.0–100.0)
Platelets: 241 10*3/uL (ref 150–400)
RBC: 3.52 MIL/uL — AB (ref 4.22–5.81)
RDW: 15.2 % (ref 11.5–15.5)
WBC: 17.6 10*3/uL — ABNORMAL HIGH (ref 4.0–10.5)

## 2016-11-09 LAB — I-STAT TROPONIN, ED
TROPONIN I, POC: 0 ng/mL (ref 0.00–0.08)
Troponin i, poc: 0 ng/mL (ref 0.00–0.08)

## 2016-11-09 LAB — APTT: aPTT: 35 seconds (ref 24–36)

## 2016-11-09 LAB — HIV ANTIBODY (ROUTINE TESTING W REFLEX): HIV Screen 4th Generation wRfx: NONREACTIVE

## 2016-11-09 LAB — PROCALCITONIN: Procalcitonin: 1.19 ng/mL

## 2016-11-09 LAB — ECHOCARDIOGRAM COMPLETE
HEIGHTINCHES: 74 in
WEIGHTICAEL: 2672 [oz_av]

## 2016-11-09 LAB — PROTIME-INR
INR: 1.27
PROTHROMBIN TIME: 16 s — AB (ref 11.4–15.2)

## 2016-11-09 LAB — LACTIC ACID, PLASMA
Lactic Acid, Venous: 1.3 mmol/L (ref 0.5–1.9)
Lactic Acid, Venous: 2.1 mmol/L (ref 0.5–1.9)

## 2016-11-09 LAB — C-REACTIVE PROTEIN: CRP: 17.3 mg/dL — AB (ref <1.0)

## 2016-11-09 LAB — URIC ACID: URIC ACID, SERUM: 1.7 mg/dL — AB (ref 4.4–7.6)

## 2016-11-09 LAB — SEDIMENTATION RATE: SED RATE: 42 mm/h — AB (ref 0–16)

## 2016-11-09 LAB — I-STAT CG4 LACTIC ACID, ED: Lactic Acid, Venous: 0.88 mmol/L (ref 0.5–1.9)

## 2016-11-09 LAB — BRAIN NATRIURETIC PEPTIDE: B Natriuretic Peptide: 139.6 pg/mL — ABNORMAL HIGH (ref 0.0–100.0)

## 2016-11-09 MED ORDER — ADULT MULTIVITAMIN W/MINERALS CH
1.0000 | ORAL_TABLET | Freq: Every day | ORAL | Status: DC
  Filled 2016-11-09: qty 1

## 2016-11-09 MED ORDER — ACETAMINOPHEN 325 MG PO TABS
650.0000 mg | ORAL_TABLET | Freq: Four times a day (QID) | ORAL | Status: DC | PRN
  Administered 2016-11-09: 650 mg via ORAL
  Filled 2016-11-09: qty 2

## 2016-11-09 MED ORDER — TRAZODONE HCL 50 MG PO TABS: 50.0000 mg | ORAL_TABLET | Freq: Every evening | ORAL | Status: DC | PRN

## 2016-11-09 MED ORDER — SODIUM CHLORIDE 0.9 % IV BOLUS (SEPSIS): 1000.0000 mL | Freq: Once | INTRAVENOUS | Status: AC

## 2016-11-09 MED ORDER — ALBUTEROL SULFATE (2.5 MG/3ML) 0.083% IN NEBU: 2.5000 mg | INHALATION_SOLUTION | Freq: Four times a day (QID) | RESPIRATORY_TRACT | Status: AC | PRN

## 2016-11-09 MED ORDER — SODIUM CHLORIDE 0.9% FLUSH: 3.0000 mL | INTRAVENOUS | Status: DC | PRN

## 2016-11-09 MED ORDER — VANCOMYCIN HCL IN DEXTROSE 1-5 GM/200ML-% IV SOLN
1000.0000 mg | Freq: Once | INTRAVENOUS | Status: AC
  Administered 2016-11-09: 1000 mg via INTRAVENOUS
  Filled 2016-11-09: qty 200

## 2016-11-09 MED ORDER — NAPROXEN 500 MG PO TABS
500.0000 mg | ORAL_TABLET | Freq: Two times a day (BID) | ORAL | Status: DC | PRN
  Administered 2016-11-09: 500 mg via ORAL
  Filled 2016-11-09: qty 1

## 2016-11-09 MED ORDER — IBUPROFEN 200 MG PO TABS: 400.0000 mg | ORAL_TABLET | Freq: Four times a day (QID) | ORAL | Status: DC | PRN

## 2016-11-09 MED ORDER — SODIUM CHLORIDE 0.9 % IV SOLN: 250.0000 mL | INTRAVENOUS | Status: DC | PRN

## 2016-11-09 MED ORDER — ONDANSETRON HCL 4 MG/2ML IJ SOLN: 4.0000 mg | Freq: Three times a day (TID) | INTRAMUSCULAR | Status: AC | PRN

## 2016-11-09 MED ORDER — LIDOCAINE HCL 2 % IJ SOLN
20.0000 mL | Freq: Once | INTRAMUSCULAR | Status: AC
  Administered 2016-11-09: 400 mg
  Filled 2016-11-09: qty 20

## 2016-11-09 MED ORDER — SODIUM CHLORIDE 0.9 % IV BOLUS (SEPSIS)
1000.0000 mL | Freq: Once | INTRAVENOUS | Status: AC
  Administered 2016-11-09: 1000 mL via INTRAVENOUS

## 2016-11-09 MED ORDER — SODIUM CHLORIDE 0.9 % IV SOLN: INTRAVENOUS | Status: DC

## 2016-11-09 MED ORDER — POTASSIUM CHLORIDE IN NACL 20-0.9 MEQ/L-% IV SOLN
INTRAVENOUS | Status: DC
  Administered 2016-11-09: 13:00:00 via INTRAVENOUS
  Filled 2016-11-09 (×2): qty 1000

## 2016-11-09 MED ORDER — SODIUM CHLORIDE 0.9% FLUSH: 3.0000 mL | Freq: Two times a day (BID) | INTRAVENOUS | Status: DC

## 2016-11-09 MED ORDER — ACETAMINOPHEN 650 MG RE SUPP: 650.0000 mg | Freq: Four times a day (QID) | RECTAL | Status: DC | PRN

## 2016-11-09 MED ORDER — AZTREONAM 2 G IJ SOLR
2.0000 g | Freq: Three times a day (TID) | INTRAMUSCULAR | Status: DC
  Administered 2016-11-09: 2 g via INTRAVENOUS
  Filled 2016-11-09 (×2): qty 2

## 2016-11-09 MED ORDER — VANCOMYCIN HCL IN DEXTROSE 1-5 GM/200ML-% IV SOLN
1000.0000 mg | Freq: Three times a day (TID) | INTRAVENOUS | Status: DC
  Administered 2016-11-09: 1000 mg via INTRAVENOUS
  Filled 2016-11-09: qty 200

## 2016-11-09 MED ORDER — HYDROMORPHONE HCL-NACL 0.5-0.9 MG/ML-% IV SOSY: 1.0000 mg | PREFILLED_SYRINGE | INTRAVENOUS | Status: AC | PRN

## 2016-11-09 NOTE — Progress Notes (Signed)
Pt refusing CT scan of chest. Pt states " [facility] and [facility] didn't show anything.". MD text paged.

## 2016-11-09 NOTE — Progress Notes (Signed)
Pharmacy Antibiotic Note  Cody Rios is a 26 y.o. male with hx of IVDU admitted on 11/09/2016 with bacteremia.  Pharmacy has been consulted for vancomycin dosing.  Plan: Vancomycin 1 Gm IV q8h VT=15-20 mg/L F/u scr/cultures/levels Daily SCr  Height: 6\' 2"  (188 cm) Weight: 150 lb (68 kg) IBW/kg (Calculated) : 82.2  Temp (24hrs), Avg:98.3 F (36.8 C), Min:98.3 F (36.8 C), Max:98.3 F (36.8 C)   Recent Labs Lab 11/04/16 0428 11/07/16 1824 11/09/16 0257 11/09/16 0313  WBC 9.7 12.9* 16.4*  --   CREATININE 0.85 0.66 0.64  --   LATICACIDVEN  --   --   --  0.88    Estimated Creatinine Clearance: 133.4 mL/min (by C-G formula based on SCr of 0.64 mg/dL).    Allergies  Allergen Reactions  . Cephalosporins Other (See Comments)    unknown  . Penicillins Nausea And Vomiting and Other (See Comments)    Childhood allergy     Antimicrobials this admission: 8/8 vancomycin >>    >>   Dose adjustments this admission:   Microbiology results:  BCx:  UCx:    Sputum:    MRSA PCR:   Thank you for allowing pharmacy to be a part of this patient's care.  Lorenza EvangelistGreen, Trinidee Schrag R 11/09/2016 5:34 AM

## 2016-11-09 NOTE — ED Triage Notes (Signed)
Patient reports he got received a phone call reporting he had a plosives blood culture and needed to come back to [facility] for treatment of staph infection. Patient reports he was not given an antibiotic from yesterdays visit.

## 2016-11-09 NOTE — Progress Notes (Signed)
Patient ID: Cody Rios, male   DOB: 01-20-1991, 26 y.o.   MRN: [id number] MRI without abscess Likely tenosynovitis Rec cont Sheron NightingaleABX Quintavia Rogstad MD

## 2016-11-09 NOTE — Progress Notes (Signed)
MRI tech came to get pt for MRI and was concerned about pt level of consciousness. Writer entered room and pt was laying in bed lethargic, but arousals to verbal stimuli. Writer ask pt if he had done any illegal drugs and pt stated no. MRI tech placed pt belonging bag on daybed and took pt to MRI. Writer made pt bed up and went to get pt belonging bag from daybed and noticed a purple glove with a spoon, clear bag with white substance and a syring sticking out of the glove. The spoon had a white substance in the middle of it. MD and CN notified of findings. CN confiscated the items.

## 2016-11-09 NOTE — Progress Notes (Signed)
Subjective: The patient is quite sedate this morning. He falls asleep intermittently throughout the examination. Overall he states that the wrist is still quite tender.  Objective: Vital signs in last 24 hours: Temp:  [98.3 F (36.8 C)-99.1 F (37.3 C)] 99.1 F (37.3 C) (08/08 0858) Pulse Rate:  [86-131] 131 (08/08 0858) Resp:  [16-18] 16 (08/08 0628) BP: (95-111)/(58-66) 109/63 (08/08 0858) SpO2:  [90 %-99 %] 96 % (08/08 0858) Weight:  [68 kg (150 lb)-75.8 kg (167 lb)] 75.8 kg (167 lb) (08/08 0628)  Intake/Output from previous day: 08/07 0701 - 08/08 0700 In: 2400 [I.V.:1200; IV Piggyback:1200] Out: -  Intake/Output this shift: No intake/output data recorded.   Recent Labs  11/07/16 1824 11/09/16 0257  HGB 11.1* 9.9*    Recent Labs  11/07/16 1824 11/09/16 0257  WBC 12.9* 16.4*  RBC 3.81* 3.36*  HCT 31.4* 27.9*  PLT 193 189    Recent Labs  11/07/16 1824 11/09/16 0257  NA 134* 135  K 3.7 3.7  CL 99* 101  CO2 26 28  BUN 15 10  CREATININE 0.66 0.64  GLUCOSE 117* 113*  CALCIUM 8.6* 8.2*   No results for input(s): LABPT, INR in the last 72 hours. Results for orders placed or performed during the hospital encounter of 11/09/16  Body fluid culture     Status: None (Preliminary result)   Collection Time: 11/09/16  4:12 AM  Result Value Ref Range Status   Specimen Description SYNOVIAL  Final   Special Requests NONE  Final   Gram Stain PENDING  Incomplete   Culture   Final    INTERPRET RESULTS WITH CAUTION DUE TO LIMITED SPECIMEN VOLUME Performed at [facility], [address] [place], [place] [id number]    Report Status PENDING  Incomplete   Focused examination of the right upper extremity shows that the right upper extremity is in a deep into position. Apparently he has removed his splint and dressings. He has focal erythema along the dorsal ulnar aspect of the wrist does not have obvious abscess or significant fluctuance present he is point tender  over the ulnar aspect of the wrist about the ECU. He does not have significant dorsal swelling about the radial aspect of the wrist. Gentle passive wrist flexion and extension are nontender. Digital range of motion is minimally tender. MRI is currently pending.  Assessment/Plan: Patient Active Problem List   Diagnosis Date Noted  . Bacteremia 11/09/2016  Will continue close observation and await the results of the MRI. If there is an obvious abscess surgical intervention will be necessary. However, it's difficult to ascertain if this is infectious versus inflammatory at this juncture. Once again we will continue close observatory care and review the MRI once it's available. I have discussed with the staff the need for diligent elevation and prompting the patient for finger range of motion. I have applied triple antibiotic ointment about the wrist followed by soft bulky wrap and a splint. We will need to diligently elevate this. Continue to follow closely.   Eun Vermeer L 11/09/2016, 9:30 AM

## 2016-11-09 NOTE — Progress Notes (Signed)
CRITICAL VALUE ALERT  Critical Value:  Lactic Acid 2.1  Date & Time Notied:  11/09/16  1440  Provider Notified: Dr Luberta Robertsonhatterjee  Orders Received/Actions taken:

## 2016-11-09 NOTE — ED Notes (Signed)
Patient transported to X-ray 

## 2016-11-09 NOTE — Care Management Note (Signed)
Case Management Note  Patient Details  Name: Cody Rios MRN: [id number] Date of Birth: [birth year]  Subjective/Objective:25 y/o m admitted w/Bacteremia.Hx: IV Drug abuse. From home.  Patient states he has a pcp,pharmacy. Provided w/health care insurance info-affordable care act-healthcare.gov website-patient voiced understanding. Patient agree to CSW providing w/substance abuse resources-CSW notified.                  Action/Plan:d/c plan home.   Expected Discharge Date:   (unknown)               Expected Discharge Plan:  Home/Self Care  In-House Referral:  Clinical Social Work  Discharge planning Services  CM Consult  Post Acute Care Choice:    Choice offered to:     DME Arranged:    DME Agency:     HH Arranged:    HH Agency:     Status of Service:  In process, will continue to follow  If discussed at Long Length of Stay Meetings, dates discussed:    Additional Comments:  Lanier ClamMahabir, Ajayla Iglesias, RN 11/09/2016, 1:04 PM

## 2016-11-09 NOTE — Progress Notes (Signed)
AC contacted and requested Drug paraphernalia be taken down to security office. Engineer, materialsecurity officer assessed drug paraphernalia  Items. Needles from pt belongings were placed in sharps. Other items left with security.

## 2016-11-09 NOTE — Consult Note (Signed)
[facility] for Infectious Disease  Total days of antibiotics 2        Day 2 vanco        Day 1 aztreo               Reason for Consult: MRSA bacteremia/endocarditis   Referring Physician: chatterjee  Active Problems:   Bacteremia    HPI: Cody Rios is a 26 y.o. 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 Patient was admitted yesterday for intermittent sharp chest pain, pleuritic in nature and shortness of breath x 3 weeks, fevers and chills and worsening pain in back, shoulders, knees and wrist. He was seen by dr Amedeo Plenty in the ED for right wrist pain and right knee having pain but patient has pain in left [facility] which is new. He underwent attempted arthrocentesis of right knee. MRI of wrist shows tenosynovitis. His CXR as concerning for multifocal pneumonia vs. Septic emboli, more convincing for septic emboli per my read. He underwent TEE with TV 0.9 x 0.57cm veg, with moderate pulmonary HTN.  He reports having significant pain to his left arm at IV site during our interview. Poor historian since he is having pain at present.  Pmhx: IVDU,  Allergies:  Allergies  Allergen Reactions  . Cephalosporins Other (See Comments)    unknown  . Penicillins Nausea And Vomiting and Other (See Comments)    Childhood allergy    MEDICATIONS: . multivitamin with minerals  1 tablet Oral Daily  . sodium chloride flush  3 mL Intravenous Q12H    Social History  Substance Use Topics  . Smoking status: Never Smoker  . Smokeless tobacco: Current User    Types: Chew  . Alcohol use No    History reviewed. No pertinent family history.   Review of Systems  Constitutional: + for fever, chills, diaphoresis, activity change, appetite change, fatigue and unexpected weight change.  HENT: Negative for congestion, sore throat, rhinorrhea, sneezing, trouble swallowing and sinus  pressure.  Eyes: Negative for photophobia and visual disturbance.  Respiratory: + pleuretic chest pain.  chest tightness, shortness of breath, wheezing and stridor.  Cardiovascular: Negative for chest pain, palpitations and leg swelling.  Gastrointestinal: Negative for nausea, vomiting, abdominal pain, diarrhea, constipation, blood in stool, abdominal distention and anal bleeding.  Genitourinary: Negative for dysuria, hematuria, flank pain and difficulty urinating.  Musculoskeletal: Negative for myalgias, back pain, joint swelling, arthralgias and gait problem.  Skin: Negative for color change, pallor, rash and wound.  Neurological: Negative for dizziness, tremors, weakness and light-headedness.  Hematological: Negative for adenopathy. Does not bruise/bleed easily.  Psychiatric/Behavioral: Negative for behavioral problems, confusion, sleep disturbance, dysphoric mood, decreased concentration and agitation.     OBJECTIVE: Temp:  [98.3 F (36.8 C)-99.1 F (37.3 C)] 99.1 F (37.3 C) (08/08 0858) Pulse Rate:  [86-131] 111 (08/08 1310) Resp:  [16-18] 16 (08/08 1310) BP: (95-112)/(58-66) 112/59 (08/08 1310) SpO2:  [90 %-100 %] 100 % (08/08 1310) Weight:  [150 lb (68 kg)-167 lb (75.8 kg)] 167 lb (75.8 kg) (08/08 4765) Physical Exam  Constitutional: He is oriented to person, place, and time. He appears well-developed and well-nourished. No distress.  HENT:  Mouth/Throat: Oropharynx is clear and moist. No oropharyngeal exudate.  Cardiovascular: tachycardic regular rhythm and normal heart sounds. Exam reveals + SM Pulmonary/Chest: tachypnea. No respiratory distress. He has no wheezes.  Abdominal: Soft. Bowel sounds are normal.  He exhibits no distension. There is no tenderness.  Lymphadenopathy:  He has no cervical adenopathy. Ext: he has right wrist bandaged. Limited range of motion. Left AC does not look like he has any PIV interpretation but wonder if IV is next to a nerve.   Neurological:  He is alert and oriented to person, place, and time.  Skin: numerous puncture marks Psychiatric: distraught     LABS: Results for orders placed or performed during the hospital encounter of 11/09/16 (from the past 48 hour(s))  CBC with Differential/Platelet     Status: Abnormal   Collection Time: 11/09/16  2:57 AM  Result Value Ref Range   WBC 16.4 (H) 4.0 - 10.5 K/uL   RBC 3.36 (L) 4.22 - 5.81 MIL/uL   Hemoglobin 9.9 (L) 13.0 - 17.0 g/dL   HCT 27.9 (L) 39.0 - 52.0 %   MCV 83.0 78.0 - 100.0 fL   MCH 29.5 26.0 - 34.0 pg   MCHC 35.5 30.0 - 36.0 g/dL   RDW 15.3 11.5 - 15.5 %   Platelets 189 150 - 400 K/uL   Neutrophils Relative % 73 %   Neutro Abs 12.0 (H) 1.7 - 7.7 K/uL   Lymphocytes Relative 14 %   Lymphs Abs 2.3 0.7 - 4.0 K/uL   Monocytes Relative 12 %   Monocytes Absolute 1.9 (H) 0.1 - 1.0 K/uL   Eosinophils Relative 1 %   Eosinophils Absolute 0.2 0.0 - 0.7 K/uL   Basophils Relative 0 %   Basophils Absolute 0.0 0.0 - 0.1 K/uL  Comprehensive metabolic panel     Status: Abnormal   Collection Time: 11/09/16  2:57 AM  Result Value Ref Range   Sodium 135 135 - 145 mmol/L   Potassium 3.7 3.5 - 5.1 mmol/L   Chloride 101 101 - 111 mmol/L   CO2 28 22 - 32 mmol/L   Glucose, Bld 113 (H) 65 - 99 mg/dL   BUN 10 6 - 20 mg/dL   Creatinine, Ser 0.64 0.61 - 1.24 mg/dL   Calcium 8.2 (L) 8.9 - 10.3 mg/dL   Total Protein 5.8 (L) 6.5 - 8.1 g/dL   Albumin 2.7 (L) 3.5 - 5.0 g/dL   AST 44 (H) 15 - 41 U/L   ALT 52 17 - 63 U/L   Alkaline Phosphatase 210 (H) 38 - 126 U/L   Total Bilirubin 1.2 0.3 - 1.2 mg/dL   GFR calc non Af Amer >60 >60 mL/min   GFR calc Af Amer >60 >60 mL/min    Comment: (NOTE) The eGFR has been calculated using the CKD EPI equation. This calculation has not been validated in all clinical situations. eGFR's persistently <60 mL/min signify possible Chronic Kidney Disease.    Anion gap 6 5 - 15  Sedimentation rate     Status: Abnormal   Collection Time: 11/09/16  2:57  AM  Result Value Ref Range   Sed Rate 42 (H) 0 - 16 mm/hr  Uric acid     Status: Abnormal   Collection Time: 11/09/16  2:57 AM  Result Value Ref Range   Uric Acid, Serum 1.7 (L) 4.4 - 7.6 mg/dL  C-reactive protein     Status: Abnormal   Collection Time: 11/09/16  2:58 AM  Result Value Ref Range   CRP 17.3 (H) <1.0 mg/dL    Comment: Performed at [facility], [place] [address] [place], [place] [address]  Brain natriuretic peptide     Status: Abnormal   Collection Time:  11/09/16  2:58 AM  Result Value Ref Range   B Natriuretic Peptide 139.6 (H) 0.0 - 100.0 pg/mL  HIV antibody     Status: None   Collection Time: 11/09/16  3:02 AM  Result Value Ref Range   HIV Screen 4th Generation wRfx Non Reactive Non Reactive    Comment: (NOTE) Performed At: [facility] [address] [place], [place] [id number] Lindon Romp MD HA:[id number]   I-stat troponin, ED     Status: None   Collection Time: 11/09/16  3:11 AM  Result Value Ref Range   Troponin i, poc 0.00 0.00 - 0.08 ng/mL   Comment 3            Comment: Due to the release kinetics of cTnI, a negative result within the first hours of the onset of symptoms does not rule out myocardial infarction with certainty. If myocardial infarction is still suspected, repeat the test at appropriate intervals.   I-Stat CG4 Lactic Acid, ED     Status: None   Collection Time: 11/09/16  3:13 AM  Result Value Ref Range   Lactic Acid, Venous 0.88 0.5 - 1.9 mmol/L  Body fluid culture     Status: None (Preliminary result)   Collection Time: 11/09/16  4:12 AM  Result Value Ref Range   Specimen Description SYNOVIAL    Special Requests NONE    Gram Stain      ABUNDANT WBC PRESENT,BOTH PMN AND MONONUCLEAR NO ORGANISMS SEEN    Culture      INTERPRET RESULTS WITH CAUTION DUE TO LIMITED SPECIMEN VOLUME Performed at [facility], [place] [address] [place], [place] [address]    Report Status PENDING   Urinalysis, Routine w reflex  microscopic     Status: Abnormal   Collection Time: 11/09/16  5:45 AM  Result Value Ref Range   Color, Urine AMBER (A) YELLOW    Comment: BIOCHEMICALS MAY BE AFFECTED BY COLOR   APPearance HAZY (A) CLEAR   Specific Gravity, Urine 1.024 1.005 - 1.030   pH 5.0 5.0 - 8.0   Glucose, UA NEGATIVE NEGATIVE mg/dL   Hgb urine dipstick SMALL (A) NEGATIVE   Bilirubin Urine NEGATIVE NEGATIVE   Ketones, ur NEGATIVE NEGATIVE mg/dL   Protein, ur 100 (A) NEGATIVE mg/dL   Nitrite POSITIVE (A) NEGATIVE   Leukocytes, UA NEGATIVE NEGATIVE   RBC / HPF 6-30 0 - 5 RBC/hpf   WBC, UA 6-30 0 - 5 WBC/hpf   Bacteria, UA RARE (A) NONE SEEN   Squamous Epithelial / LPF NONE SEEN NONE SEEN   Mucous PRESENT    Ca Oxalate Crys, UA PRESENT   Lactic acid, plasma     Status: None   Collection Time: 11/09/16 10:30 AM  Result Value Ref Range   Lactic Acid, Venous 1.3 0.5 - 1.9 mmol/L  Procalcitonin     Status: None   Collection Time: 11/09/16 10:30 AM  Result Value Ref Range   Procalcitonin 1.19 ng/mL    Comment:        Interpretation: PCT > 0.5 ng/mL and <= 2 ng/mL: Systemic infection (sepsis) is possible, but other conditions are known to elevate PCT as well. (NOTE)         ICU PCT Algorithm               Non ICU PCT Algorithm    ----------------------------     ------------------------------         PCT < 0.25 ng/mL  PCT < 0.1 ng/mL     Stopping of antibiotics            Stopping of antibiotics       strongly encouraged.               strongly encouraged.    ----------------------------     ------------------------------       PCT level decrease by               PCT < 0.25 ng/mL       >= 80% from peak PCT       OR PCT 0.25 - 0.5 ng/mL          Stopping of antibiotics                                             encouraged.     Stopping of antibiotics           encouraged.    ----------------------------     ------------------------------       PCT level decrease by              PCT >= 0.25  ng/mL       < 80% from peak PCT        AND PCT >= 0.5 ng/mL             Continuing antibiotics                                              encouraged.       Continuing antibiotics            encouraged.    ----------------------------     ------------------------------     PCT level increase compared          PCT > 0.5 ng/mL         with peak PCT AND          PCT >= 0.5 ng/mL             Escalation of antibiotics                                          strongly encouraged.      Escalation of antibiotics        strongly encouraged.   Protime-INR     Status: Abnormal   Collection Time: 11/09/16 10:30 AM  Result Value Ref Range   Prothrombin Time 16.0 (H) 11.4 - 15.2 seconds   INR 1.27   APTT     Status: None   Collection Time: 11/09/16 10:30 AM  Result Value Ref Range   aPTT 35 24 - 36 seconds  Lactic acid, plasma     Status: Abnormal   Collection Time: 11/09/16  1:56 PM  Result Value Ref Range   Lactic Acid, Venous 2.1 (HH) 0.5 - 1.9 mmol/L    Comment: CRITICAL RESULT CALLED TO, READ BACK BY AND VERIFIED WITH: PELLITER,E @ 1440 ON [id number] BY POTEAT,S     MICRO: 8/7 blood cx MSSA 8/8 GPC IMAGING: Dg Chest 2 View  Result Date: 11/09/2016 CLINICAL DATA:  Acute onset of generalized  chest pain and shortness of breath. Bacteremia. Initial encounter. EXAM: CHEST  2 VIEW COMPARISON:  CT of the chest performed 11/04/2016 FINDINGS: New bilateral midlung airspace opacities raise concern for multifocal pneumonia. No pleural effusion or pneumothorax is seen. The heart is normal in size. No acute osseous abnormalities are seen. IMPRESSION: New bilateral midlung airspace opacities raise concern for multifocal pneumonia. Electronically Signed   By: Garald Balding M.D.   On: 11/09/2016 04:58   Dg Wrist 2 Views Right  Result Date: 11/09/2016 CLINICAL DATA:  Pain and swelling for 3 days. EXAM: RIGHT WRIST - 2 VIEW COMPARISON:  RIGHT hand radiograph July 22, 2010 FINDINGS: There is no evidence  of fracture or dislocation. There is no evidence of arthropathy or other focal bone abnormality. Soft tissues are unremarkable. IMPRESSION: Negative. Electronically Signed   By: Elon Alas M.D.   On: 11/09/2016 04:17   Dg Wrist Complete Left  Result Date: 11/09/2016 CLINICAL DATA:  Pain and swelling for 3 days. EXAM: LEFT WRIST - COMPLETE 3+ VIEW COMPARISON:  None. FINDINGS: There is no evidence of fracture or dislocation. There is no evidence of arthropathy or other focal bone abnormality. Soft tissues are unremarkable. IMPRESSION: Negative. Electronically Signed   By: Elon Alas M.D.   On: 11/09/2016 04:18   Mr Wrist Right Wo Contrast  Result Date: 11/09/2016 CLINICAL DATA:  History of IV drug abuse. Right wrist pain, concern for infection. EXAM: MR OF THE RIGHT WRIST WITHOUT CONTRAST TECHNIQUE: Multiplanar, multisequence MR imaging of the right wrist was performed. No intravenous contrast was administered. COMPARISON:  Right wrist x-rays dated November 09, 2016. FINDINGS: Limited and incomplete examination due to patient's inability to tolerate further sequences secondary to pain. Ligaments: Intact scapholunate and lunotriquetral ligaments. Triangular fibrocartilage: Intact TFCC. Tendons: There is trace fluid in the adductor pollicis longus tendon sheath at the level of the distal scaphoid. There is fluid within the flexor carpi the radialis longus and brevis tendon sheaths at the level of Lister's tubercle. Mild thickening of the extensor carpi ulnaris tendon with a small amount of fluid in its tendon sheath. The flexor tendons are intact. Carpal tunnel/median nerve: Normal carpal tunnel. Normal median nerve. Guyon's canal: Normal. Joint/cartilage: Small radiocarpal joint effusion. No definite chondral defect. Bones/carpal alignment: No marrow signal abnormality. Normal alignment. No aggressive osseous lesion. Other: No fluid collection or hematoma. IMPRESSION: 1. Limited and incomplete  examination due to patient's inability to tolerate further sequences secondary to pain. 2. Mild tenosynovitis of the abductor pollicis longus, flexor carpi the radialis, and extensor carpi ulnaris. Infectious tenosynovitis cannot be excluded on the basis of imaging alone. 3. Small radiocarpal joint effusion, nonspecific. If there is clinical concern for septic arthritis, joint aspiration is recommended. Electronically Signed   By: Titus Dubin M.D.   On: 11/09/2016 11:45   Dg Knee Complete 4 Views Right  Result Date: 11/09/2016 CLINICAL DATA:  Knee swelling for 3 days, no injury. EXAM: RIGHT KNEE - COMPLETE 4+ VIEW COMPARISON:  None. FINDINGS: No evidence of fracture, dislocation, or joint effusion. No evidence of arthropathy or other focal bone abnormality. Mild prepatellar soft tissue swelling. Small suprapatellar joint effusion. IMPRESSION: Soft-tissue swelling and small suprapatellar joint effusion. No acute osseous process or advanced degenerative change for age. Electronically Signed   By: Elon Alas M.D.   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 - recommend to repeat blood cx - will d/c aztreonam - recommend to check hep c ab  ivdu - recently caught injecting in his room. Place tele-sitter for observation. Consider suboxone but not sure what his opiate needs/equivalents.  TV endocarditis = if still + blood cx after 48hrs, consider looking for other sites of metastatic infection such as imaging of spine

## 2016-11-09 NOTE — ED Notes (Signed)
Call report to [facility] [phone] 5:50 am

## 2016-11-09 NOTE — Progress Notes (Signed)
Pt upset and requesting to leave AMA.  Writer explained to pt that he could die from his illness if he leaves and does not seek medical help. Pt verbalized understanding and still requesting to leave AMA. Dr Toniann FailKakrakandy notified and requested that writer reeducate pt on his rights and that he could possible die from his diease process. Writer reeducated pt and pt verbalized understanding and still requesting to leave AMA. Pt given the key to his locker to get his snuff out of security.

## 2016-11-09 NOTE — H&P (Addendum)
History and Physical:    Cody Rios   ZOX:[id number] DOB: December 03, 1990 DOA: 11/09/2016  Referring MD/provider: None PCP: System, [name]   Patient coming from: Home  Chief Complaint: Fevers chills joint aches chest pain 3 weeks  History of Present Illness:   Cody Rios is an 26 y.o. 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 He subsequently noted he had aching in his whole body and presented to [facility] emergency room where he was treated with azithromycin and discharged. Approximately one week ago patient presented again with worsening fevers and chills and worsening pain in his back shoulders knees and wrists with continued chest pain. At that time he was evaluated for possible endocarditis and blood cultures 3 were drawn. Admission was advised pending blood cultures however patient declined to be admitted. Patient was cautioned that he may well have bacterial endocarditis and that this could lead to death if untreated however patient continued to decline admission at that time. Blood cultures subsequently have grown out MSSA and patient was called back in for admission. He now agrees to admission.  To my history patient's main concern is pain in his entire body including his bilateral wrists right greater than left knees shoulders and back. He notes he is very debilitated and is tired all the time. He admits to chest pain as he has had before. He does believe the chest pain is worse with deep breathing.  Of note patient states he has a history of juvenile rheumatoid arthritis diagnosed at age 24. He states he was on sulfasalazine for about 2-3 years however stopped taking it as he didn't think it was doing anything.  ED Course:  The patient was treated with IV fluids and IV vancomycin. Because of  complaints of right wrist and right knee pain, orthopedic surgery and hand surgery were called for consultation. An MRI was also ordered of the wrist. Both orthopedic surgeon in the hand surgeon did not feel there was much fluid to tap. They felt physical exam was consistent with synovitis rather than septic arthritis.  ROS:   ROS  He denies abdominal pain, nausea or vomiting or diarrhea. He denies a rash. He denies any dysuria. Patient denies headache, confusion or difficulty with mentation.  Past Medical History:   JRA juvenile rheumatoid arthritis  Past Surgical History:   Past Surgical History:  Procedure Laterality Date  . HERNIA REPAIR    . KNEE SURGERY      Social History:   Social History   Social History  . Marital status: Single    Spouse name: N/A  . Number of children: N/A  . Years of education: N/A   Occupational History  . Not on file.   Social History Main Topics  . Smoking status: Never Smoker  . Smokeless tobacco: Current User    Types: Chew  . Alcohol use No  . Drug use: Yes    Types: IV     Comment: Last used: 22:00   . Sexual activity: Not on file   Other Topics Concern  . Not on file   Social History Narrative  . No narrative on file    Allergies   Cephalosporins and Penicillins  Family history:   History reviewed. No pertinent family history.  Current Medications:  Prior to Admission medications   Medication Sig Start Date End Date Taking? Authorizing Provider  naproxen (NAPROSYN) 500 MG tablet Take 1 tablet (500 mg total) by mouth 2 (two) times daily. 11/04/16  Yes [name], [name], MD  azithromycin (ZITHROMAX) 250 MG tablet Take first 2 tablets together, then 1 every day until finished. Patient not taking: Reported on 11/09/2016 11/04/16   [name], [name], MD  ibuprofen (ADVIL,MOTRIN) 800 MG tablet Take 1 tablet (800 mg total) by mouth every 8 (eight) hours as needed. Patient taking differently: Take 800 mg by mouth every 8 (eight) hours as  needed for moderate pain.  11/08/16   [name], Christopher, PA-C    Physical Exam:   Vitals:   11/09/16 0500 11/09/16 0530 11/09/16 0628 11/09/16 0858  BP: (!) 101/58 102/66 111/63 109/63  Pulse: 93 91 98 (!) 131  Resp:   16   Temp:   99.1 F (37.3 C) 99.1 F (37.3 C)  TempSrc:   Oral Oral  SpO2: 97% 99% 98% 96%  Weight:   75.8 kg (167 lb)   Height:   6\' 2"  (1.88 m)      Physical Exam: Blood pressure 109/63, pulse (!) 131, temperature 99.1 F (37.3 C), temperature source Oral, resp. rate 16, height 6\' 2"  (1.88 m), weight 75.8 kg (167 lb), SpO2 96 %. Gen: Thin somewhat stuporous man lying in bed looking high. He is cooperative with questioning although he is stiff when trying to move around. Head: Normocephalic atraumatic he does have bitemporal wasting bilaterally  Eyes: Pupils are pinpoint. Extraocular movements intact.  Sclerae nonicteric. No lid lag. Neck:  Patient seems to have tenderness to palpation along entire C-spine although I am unable to elicit any focal tenderness as such. Patient has tenderness with palpation along most of his long bones and spine. He is able to move his head laterally and flexion and extension are intact. Chest:  Moderately good air entry bilaterally however he does have decreased breath sounds at right base. No bronchial breath sounds no rales no rhonchi. CV: Regular, tachycardia, I hear no murmurs at all. Abdomen: Thin, scaphoid, Soft, nontender, nondistended with normal active bowel sounds. He may have some tenderness in the left upper quadrant although I'm not sure it's reproducible. No rebound no guarding.  Extremities: Patient with tenderness with palpation of his wrists shoulders and knees. Right wrist with mild erythema no bogginess. Right knee status post aspiration attempt, no redness no fluid. Skin: Patient may be has a faint pink rash anterior left chest.  Neuro: Nonfocal but drowsy. He is coherent and logical but seems to be high  Psych:  Patient does not appear to have good insight into his condition. He states he last used at 10:30 last night and has no intention of stopping IVDU. Patient's main concern seems to be getting pain medications for his joint pains. He does not seem to have much concern about the bacteremia and its possible consequences.  Data Review:    Labs: Basic Metabolic Panel:  Recent Labs Lab 11/04/16 0428 11/07/16 1824 11/09/16 0257  NA 133* 134* 135  K 3.9 3.7 3.7  CL 97* 99* 101  CO2 27 26 28   GLUCOSE 158* 117* 113*  BUN 14 15 10   CREATININE 0.85 0.66 0.64  CALCIUM 8.4* 8.6* 8.2*   Liver Function Tests:  Recent Labs Lab 11/09/16 0257  AST 44*  ALT 52  ALKPHOS 210*  BILITOT 1.2  PROT 5.8*  ALBUMIN 2.7*   No results for  input(s): LIPASE, AMYLASE in the last 168 hours. No results for input(s): AMMONIA in the last 168 hours. CBC:  Recent Labs Lab 11/04/16 0428 11/07/16 1824 11/09/16 0257  WBC 9.7 12.9* 16.4*  NEUTROABS  --   --  12.0*  HGB 12.0* 11.1* 9.9*  HCT 36.0* 31.4* 27.9*  MCV 84.9 82.4 83.0  PLT 178 193 189   Cardiac Enzymes: No results for input(s): CKTOTAL, CKMB, CKMBINDEX, TROPONINI in the last 168 hours.  BNP (last 3 results) No results for input(s): PROBNP in the last 8760 hours. CBG: No results for input(s): GLUCAP in the last 168 hours.  Urinalysis    Component Value Date/Time   COLORURINE AMBER (A) 11/09/2016 0545   APPEARANCEUR HAZY (A) 11/09/2016 0545   LABSPEC 1.024 11/09/2016 0545   PHURINE 5.0 11/09/2016 0545   GLUCOSEU NEGATIVE 11/09/2016 0545   HGBUR SMALL (A) 11/09/2016 0545   BILIRUBINUR NEGATIVE 11/09/2016 0545   KETONESUR NEGATIVE 11/09/2016 0545   PROTEINUR 100 (A) 11/09/2016 0545   NITRITE POSITIVE (A) 11/09/2016 0545   LEUKOCYTESUR NEGATIVE 11/09/2016 0545      Radiographic Studies: Dg Chest 2 View  Result Date: 11/09/2016 CLINICAL DATA:  Acute onset of generalized chest pain and shortness of breath. Bacteremia. Initial  encounter. EXAM: CHEST  2 VIEW COMPARISON:  CT of the chest performed 11/04/2016 FINDINGS: New bilateral midlung airspace opacities raise concern for multifocal pneumonia. No pleural effusion or pneumothorax is seen. The heart is normal in size. No acute osseous abnormalities are seen. IMPRESSION: New bilateral midlung airspace opacities raise concern for multifocal pneumonia. Electronically Signed   By: [name] M.D.   On: 11/09/2016 04:58   Dg Wrist 2 Views Right  Result Date: 11/09/2016 CLINICAL DATA:  Pain and swelling for 3 days. EXAM: RIGHT WRIST - 2 VIEW COMPARISON:  RIGHT hand radiograph July 22, 2010 FINDINGS: There is no evidence of fracture or dislocation. There is no evidence of arthropathy or other focal bone abnormality. Soft tissues are unremarkable. IMPRESSION: Negative. Electronically Signed   By: [name] M.D.   On: 11/09/2016 04:17   Dg Wrist Complete Left  Result Date: 11/09/2016 CLINICAL DATA:  Pain and swelling for 3 days. EXAM: LEFT WRIST - COMPLETE 3+ VIEW COMPARISON:  None. FINDINGS: There is no evidence of fracture or dislocation. There is no evidence of arthropathy or other focal bone abnormality. Soft tissues are unremarkable. IMPRESSION: Negative. Electronically Signed   By: [name] M.D.   On: 11/09/2016 04:18   Mr Wrist Right Wo Contrast  Result Date: 11/09/2016 CLINICAL DATA:  History of IV drug abuse. Right wrist pain, concern for infection. EXAM: MR OF THE RIGHT WRIST WITHOUT CONTRAST TECHNIQUE: Multiplanar, multisequence MR imaging of the right wrist was performed. No intravenous contrast was administered. COMPARISON:  Right wrist x-rays dated November 09, 2016. FINDINGS: Limited and incomplete examination due to patient's inability to tolerate further sequences secondary to pain. Ligaments: Intact scapholunate and lunotriquetral ligaments. Triangular fibrocartilage: Intact TFCC. Tendons: There is trace fluid in the adductor pollicis longus tendon  sheath at the level of the distal scaphoid. There is fluid within the flexor carpi the radialis longus and brevis tendon sheaths at the level of Lister's tubercle. Mild thickening of the extensor carpi ulnaris tendon with a small amount of fluid in its tendon sheath. The flexor tendons are intact. Carpal tunnel/median nerve: Normal carpal tunnel. Normal median nerve. Guyon's canal: Normal. Joint/cartilage: Small radiocarpal joint effusion. No definite chondral defect. Bones/carpal alignment: No marrow  signal abnormality. Normal alignment. No aggressive osseous lesion. Other: No fluid collection or hematoma. IMPRESSION: 1. Limited and incomplete examination due to patient's inability to tolerate further sequences secondary to pain. 2. Mild tenosynovitis of the abductor pollicis longus, flexor carpi the radialis, and extensor carpi ulnaris. Infectious tenosynovitis cannot be excluded on the basis of imaging alone. 3. Small radiocarpal joint effusion, nonspecific. If there is clinical concern for septic arthritis, joint aspiration is recommended. Electronically Signed   By: Obie Dredge M.D.   On: 11/09/2016 11:45   Dg Knee Complete 4 Views Right  Result Date: 11/09/2016 CLINICAL DATA:  Knee swelling for 3 days, no injury. EXAM: RIGHT KNEE - COMPLETE 4+ VIEW COMPARISON:  None. FINDINGS: No evidence of fracture, dislocation, or joint effusion. No evidence of arthropathy or other focal bone abnormality. Mild prepatellar soft tissue swelling. Small suprapatellar joint effusion. IMPRESSION: Soft-tissue swelling and small suprapatellar joint effusion. No acute osseous process or advanced degenerative change for age. Electronically Signed   By: [name] M.D.   On: 11/09/2016 04:15    EKG: I'm unable to pull up EKG from 87 however EKG from 86 was reviewed. He has normal sinus rhythm with normal intervals. Positive LAD,  huge voltage although patient is thin. Positive LVH Independently reviewed.     Assessment/Plan:   Active Problems:   Bacteremia  BACTEREMIA R/O ENDOCARDITIS Patient's clinical symptomatology and MSSA isHighly suggestive of likely endocarditis given ongoing injection drug use.  Will order echocardiogram and another set of blood cultures. He has already had 6 sets drawn so far. Patient may well need a TEE. Vancomycin was started in the ED yesterday (patient is allergic to penicillin and cephalosporin).  I have added aztreonam for gram-negative coverage given IVDU.  Sedimentation rate and CRP have been ordered as well for tomorrow morning.  SEPTIC PULMONARY EMBOLI Chest x-ray concerning for septic emboli. CT chest have been ordered. Patient is on vancomycin and aztreonam. He does not have an oxygen requirement at present. Will need to follow closely.  ELEVATED LFTS CT abdomen has been ordered to rule out emboli to liver, spleen or kidneys.  HYPOTENSION Continue aggressive fluid repletion and follow closely.  TENOSYNOVITIS Clinical presentation and imaging are not suggestive of septic arthritis. I agree this is most likely tenosynovitis reactive to likely SBE. Will continue treatment of SBE.  HEROIN ADDICTION  At present patient's blood pressure precludes use of clonidine to help with symptomatology. Of note patient was found to have heroin in his room and was found to be more lethargic than earlier. It is suspected he injected in his room. The heroin was confiscated. When his blood pressure is able to tolerate it can consider starting clonidine. At present he is not withdrawing.   HIV screening The patient falls between the ages of 13-64 and should be screened for HIV, therefore HIV testing ordered.  Body mass index is 21.44 kg/m.  Other information:   DVT prophylaxis: Lovenox ordered. Code Status: Full code. Family Communication: Patient did not want me to contact his family Disposition Plan: Likely home Consults called: Orthopedic surgery and hand  surgery were called by ED. Cardiology/ID may need to be called depending upon results of testing currently underway. Admission status: Inpatient telemetry  The medical decision making on this patient was of high complexity and the patient is at high risk for clinical deterioration, therefore this is a level 3 visit.  Horatio Pel Orma Flaming [facility] Pager [phone] Cell: [phone]   If  7PM-7AM, please contact night-coverage [email or web address] Password [facility] 11/09/2016, 12:35 PM

## 2016-11-09 NOTE — Progress Notes (Signed)
CSW attempted to go see patient for substance abuse consult, patient in route to CT. CSW will try again later.  Celso SickleKimberly Whitt Auletta, ConnecticutLCSWA Clinical Social Worker [facility] Cell#: [phone]

## 2016-11-09 NOTE — Consult Note (Signed)
Reason for Consult: Wrist pain right upper extremity Referring Physician: ER staff  Cody Rios is an 26 y.o. male.  HPI: [age] male with a history of addiction issues since the age of 14. He states he started using pain pills at age 33. This is more into a long-standing bout with hair when addiction. He shoots up every day.  He states he shot up to 5 hours ago in the left arm. Prior to this time the left wrist was bothering him until a day ago and now the left wrist is not tender. The area and tenderness today is the right ulna wrist about the EC U tendon. He also complains of knee pain and complains of general malaise. He states his difficult and again on the bathroom toilet and to even walk around. He queries whether he has endocarditis as one of his friend city shoots up with daily has thought with this [place] in 2015.  He is nice and brutally honest patient. He understands his addiction and his predicament  At present time I've examined the wrist at length. I was called at approximately 3 AM and came in immediately to see him. He notes no history of injection in the wrist. He notes no history of trauma to the wrist. He notes no locking popping catching or prior injuries in the wrist region.  Once again of interesting note is the left wrist was painful days ago but this resolved. Now the right wrist and the right knee are painful. He states that he is been taking naproxen. This seems to help a bit.  History reviewed. No pertinent past medical history.  Past Surgical History:  Procedure Laterality Date  . HERNIA REPAIR    . KNEE SURGERY      History reviewed. No pertinent family history.  Social History:  reports that he has never smoked. His smokeless tobacco use includes Chew. He reports that he uses drugs, including IV. He reports that he does not drink alcohol.  Allergies:  Allergies  Allergen Reactions  . Cephalosporins Other (See Comments)    unknown  . Penicillins  Nausea And Vomiting and Other (See Comments)    Childhood allergy     Medications: I have reviewed the patient's current medications.  Results for orders placed or performed during the hospital encounter of 11/07/16 (from the past 48 hour(s))  Basic metabolic panel     Status: Abnormal   Collection Time: 11/07/16  6:24 PM  Result Value Ref Range   Sodium 134 (L) 135 - 145 mmol/L   Potassium 3.7 3.5 - 5.1 mmol/L   Chloride 99 (L) 101 - 111 mmol/L   CO2 26 22 - 32 mmol/L   Glucose, Bld 117 (H) 65 - 99 mg/dL   BUN 15 6 - 20 mg/dL   Creatinine, Ser 0.66 0.61 - 1.24 mg/dL   Calcium 8.6 (L) 8.9 - 10.3 mg/dL   GFR calc non Af Amer >60 >60 mL/min   GFR calc Af Amer >60 >60 mL/min    Comment: (NOTE) The eGFR has been calculated using the CKD EPI equation. This calculation has not been validated in all clinical situations. eGFR's persistently <60 mL/min signify possible Chronic Kidney Disease.    Anion gap 9 5 - 15  CBC     Status: Abnormal   Collection Time: 11/07/16  6:24 PM  Result Value Ref Range   WBC 12.9 (H) 4.0 - 10.5 K/uL   RBC 3.81 (L) 4.22 - 5.81 MIL/uL  Hemoglobin 11.1 (L) 13.0 - 17.0 g/dL   HCT 31.4 (L) 39.0 - 52.0 %   MCV 82.4 78.0 - 100.0 fL   MCH 29.1 26.0 - 34.0 pg   MCHC 35.4 30.0 - 36.0 g/dL   RDW 14.8 11.5 - 15.5 %   Platelets 193 150 - 400 K/uL  POCT i-Stat troponin I     Status: None   Collection Time: 11/07/16  6:35 PM  Result Value Ref Range   Troponin i, poc 0.00 0.00 - 0.08 ng/mL   Comment 3            Comment: Due to the release kinetics of cTnI, a negative result within the first hours of the onset of symptoms does not rule out myocardial infarction with certainty. If myocardial infarction is still suspected, repeat the test at appropriate intervals.   Culture, blood (routine x 2)     Status: Abnormal (Preliminary result)   Collection Time: 11/08/16 12:15 AM  Result Value Ref Range   Specimen Description BLOOD RIGHT ARM    Special Requests       BOTTLES DRAWN AEROBIC AND ANAEROBIC Blood Culture adequate volume   Culture  Setup Time      GRAM POSITIVE COCCI IN CLUSTERS IN BOTH AEROBIC AND ANAEROBIC BOTTLES CRITICAL RESULT CALLED TO, READ BACK BY AND VERIFIED WITH: A. HARTLEY RN 11/18/16 1835 BEAMJ Performed at [facility] [address], [place], [place] [address]    Culture STAPHYLOCOCCUS AUREUS (A)    Report Status PENDING   Culture, blood (routine x 2)     Status: None (Preliminary result)   Collection Time: 11/08/16 12:18 AM  Result Value Ref Range   Specimen Description BLOOD BLOOD RIGHT HAND    Special Requests IN PEDIATRIC BOTTLE Blood Culture adequate volume    Culture  Setup Time      GRAM POSITIVE COCCI IN CLUSTERS IN PEDIATRIC BOTTLE CRITICAL VALUE NOTED.  VALUE IS CONSISTENT WITH PREVIOUSLY REPORTED AND CALLED VALUE. Performed at [facility], [place] [address], [place], [place] [address]    Culture GRAM POSITIVE COCCI    Report Status PENDING   Blood Culture ID Panel (Reflexed)     Status: Abnormal   Collection Time: 11/08/16 12:18 AM  Result Value Ref Range   Enterococcus species NOT DETECTED NOT DETECTED   Listeria monocytogenes NOT DETECTED NOT DETECTED   Staphylococcus species DETECTED (A) NOT DETECTED    Comment: CRITICAL RESULT CALLED TO, READ BACK BY AND VERIFIED WITH: A. HARTLEY RN 11/08/16 1835 BEAMJ    Staphylococcus aureus DETECTED (A) NOT DETECTED    Comment: Methicillin (oxacillin) susceptible Staphylococcus aureus (MSSA). Preferred therapy is anti staphylococcal beta lactam antibiotic (Cefazolin or Nafcillin), unless clinically contraindicated. CRITICAL RESULT CALLED TO, READ BACK BY AND VERIFIED WITH: A. HARTLEY RN 11/08/16 1835 BEAMJ    Methicillin resistance NOT DETECTED NOT DETECTED   Streptococcus species NOT DETECTED NOT DETECTED   Streptococcus agalactiae NOT DETECTED NOT DETECTED   Streptococcus pneumoniae NOT DETECTED NOT DETECTED   Streptococcus pyogenes NOT DETECTED  NOT DETECTED   Acinetobacter baumannii NOT DETECTED NOT DETECTED   Enterobacteriaceae species NOT DETECTED NOT DETECTED   Enterobacter cloacae complex NOT DETECTED NOT DETECTED   Escherichia coli NOT DETECTED NOT DETECTED   Klebsiella oxytoca NOT DETECTED NOT DETECTED   Klebsiella pneumoniae NOT DETECTED NOT DETECTED   Proteus species NOT DETECTED NOT DETECTED   Serratia marcescens NOT DETECTED NOT DETECTED   Haemophilus influenzae NOT DETECTED  NOT DETECTED   Neisseria meningitidis NOT DETECTED NOT DETECTED   Pseudomonas aeruginosa NOT DETECTED NOT DETECTED   Candida albicans NOT DETECTED NOT DETECTED   Candida glabrata NOT DETECTED NOT DETECTED   Candida krusei NOT DETECTED NOT DETECTED   Candida parapsilosis NOT DETECTED NOT DETECTED   Candida tropicalis NOT DETECTED NOT DETECTED    Comment: Performed at [facility], [place] [address] [place], [place] [address]    No results found.  Review of Systems  Constitutional: Positive for malaise/fatigue.  Cardiovascular: Negative.   Musculoskeletal: Positive for joint pain and myalgias.  Skin: Negative.   Neurological: Negative.   Psychiatric/Behavioral: Positive for substance abuse.   Blood pressure 103/65, pulse 100, temperature 98.3 F (36.8 C), temperature source Oral, resp. rate 18, height 6' 2" (1.88 m), weight 68 kg (150 lb), SpO2 99 %. Physical Exam White male alert and oriented. He has multiple track marks in the arms but no drill erythema cellulitis or obvious abscess in the antecubital fossa upper arm or forearm. He has some redness over the ECU region of his wrist ulnarly. Interestingly he does not have pain on passive motion of the wrist. I perform passive motion about the wrist and I did not palpate or elicit any advance pain he states his grip is poor. He does flex and extend the fingers and has normal pulse no compartment syndrome and no evidence of necrotizing fasciitis. Certainly this small red streak over the ECU  lens one to consider a host of diagnostic entities. Nevertheless I do not see an obvious abscess to drain.  I discussed and these issues. I recommend an MRI scan of the wrist to evaluate for advanced fluid collection or other intensity. I discussed and these issues at length.  At present juncture he is growing out gram-positive cocci on a blood culture which is concerning. Assessment/Plan: Right wrist ulna pain over the [facility] you. Without gross pain on motion and without gross fluctuance I would recommend admission, IV antibiotics, medical workup and a MRI of the right wrist.  General orthopedics will see him for his knee. The hospitalist will plan to admit him. Certainly endocarditis is an issue that we have to consider in a patient such as Mr. Mainwaring.  At present juncture I applied seen MRI the wrist. I do consider nonseptic issues such as ECU tendinitis, calcific tendinitis, transient synovitis, and a host of other issues which can produce the wrist pain. Although I do not see a obvious history one has to consider an acute gout arthropathy as well.  He denies any history of crystalline arthropathy. We will check a uric acid.  We'll obtain the necessary study and I'll see him during his admission. I will hold off on tapping or placing anything in the wrist until we see the MRI scan.  Adelia Baptista III,Quashon Jesus M 11/09/2016, 3:11 AM

## 2016-11-09 NOTE — Progress Notes (Signed)
A consult was received from an ED physician for vancomycin per pharmacy dosing.  The patient's profile has been reviewed for ht/wt/allergies/indication/available labs.   A one time order has been placed for vancomycin.  Further antibiotics/pharmacy consults should be ordered by admitting physician if indicated.                       Thank you, Lorenza EvangelistGreen, Reiner Loewen R 11/09/2016  3:52 AM

## 2016-11-09 NOTE — ED Notes (Signed)
Pt. reminded for urinalysis,verbalized understanding . 

## 2016-11-09 NOTE — ED Triage Notes (Signed)
Pt c/o 9/10 chest tightness and SOB, pt states he was seen on [facility] for the same complain and just been release, pt things he has endocarditis.

## 2016-11-09 NOTE — Consult Note (Signed)
Reason for Consult:Rule out right septic knee Referring Physician: Velvet Bathe is an 26 y.o. male.  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  He was found to have staph bacteremia.   Last night there was also some concern of wrist pain and right knee pain.  This led to an aspiration of the right knee which found only scant benign appearing fluid.  Culture is pending. The patient tells me he has bilateral knee ankle and feet achy pain He notes mild bilateral knee symptoms which have been present for several years and he has had arthroscopic surgery on the right knee.  He notes no acute change recently.  History reviewed. No pertinent past medical history.  Past Surgical History:  Procedure Laterality Date  . HERNIA REPAIR    . KNEE SURGERY      History reviewed. No pertinent family history.  Social History:  reports that he has never smoked. His smokeless tobacco use includes Chew. He reports that he uses drugs, including IV. He reports that he does not drink alcohol.  Allergies:  Allergies  Allergen Reactions  . Cephalosporins Other (See Comments)    unknown  . Penicillins Nausea And Vomiting and Other (See Comments)    Childhood allergy     Medications: I have reviewed the patient's current medications.  Results for orders placed or performed during the hospital encounter of 11/09/16 (from the past 48 hour(s))  CBC with Differential/Platelet     Status: Abnormal   Collection Time: 11/09/16  2:57 AM  Result Value Ref Range   WBC 16.4 (H) 4.0 - 10.5 K/uL   RBC 3.36 (L) 4.22 - 5.81 MIL/uL   Hemoglobin 9.9 (L) 13.0 - 17.0 g/dL   HCT 27.9 (L) 39.0 - 52.0 %   MCV 83.0 78.0 - 100.0 fL   MCH 29.5 26.0 - 34.0 pg   MCHC 35.5 30.0 - 36.0 g/dL   RDW 15.3 11.5 - 15.5 %   Platelets 189 150 - 400 K/uL   Neutrophils Relative % 73 %   Neutro Abs 12.0 (H) 1.7 - 7.7 K/uL   Lymphocytes Relative 14 %   Lymphs Abs 2.3 0.7 - 4.0 K/uL   Monocytes Relative 12 %   Monocytes Absolute 1.9 (H) 0.1 - 1.0 K/uL   Eosinophils Relative 1 %   Eosinophils Absolute 0.2 0.0 - 0.7 K/uL   Basophils Relative 0 %   Basophils Absolute 0.0 0.0 - 0.1 K/uL  Comprehensive metabolic panel     Status: Abnormal   Collection Time: 11/09/16  2:57 AM  Result Value Ref Range   Sodium 135 135 - 145 mmol/L   Potassium 3.7 3.5 - 5.1 mmol/L   Chloride 101 101 - 111 mmol/L   CO2 28 22 - 32 mmol/L   Glucose, Bld 113 (H) 65 - 99 mg/dL   BUN 10 6 - 20 mg/dL   Creatinine, Ser 0.64 0.61 - 1.24 mg/dL   Calcium 8.2 (L) 8.9 - 10.3 mg/dL   Total Protein 5.8 (L) 6.5 - 8.1 g/dL   Albumin 2.7 (L) 3.5 - 5.0 g/dL   AST 44 (H) 15 - 41 U/L   ALT 52 17 - 63 U/L   Alkaline Phosphatase 210 (H) 38 - 126 U/L   Total Bilirubin 1.2 0.3 - 1.2 mg/dL   GFR calc non Af  Amer >60 >60 mL/min   GFR calc Af Amer >60 >60 mL/min    Comment: (NOTE) The eGFR has been calculated using the CKD EPI equation. This calculation has not been validated in all clinical situations. eGFR's persistently <60 mL/min signify possible Chronic Kidney Disease.    Anion gap 6 5 - 15  Sedimentation rate     Status: Abnormal   Collection Time: 11/09/16  2:57 AM  Result Value Ref Range   Sed Rate 42 (H) 0 - 16 mm/hr  Uric acid     Status: Abnormal   Collection Time: 11/09/16  2:57 AM  Result Value Ref Range   Uric Acid, Serum 1.7 (L) 4.4 - 7.6 mg/dL  Brain natriuretic peptide     Status: Abnormal   Collection Time: 11/09/16  2:58 AM  Result Value Ref Range   B Natriuretic Peptide 139.6 (H) 0.0 - 100.0 pg/mL  I-stat troponin, ED     Status: None   Collection Time: 11/09/16  3:11 AM  Result Value Ref Range   Troponin i, poc 0.00 0.00 - 0.08 ng/mL   Comment 3            Comment: Due to the release kinetics of cTnI, a negative result within the first hours of the  onset of symptoms does not rule out myocardial infarction with certainty. If myocardial infarction is still suspected, repeat the test at appropriate intervals.   I-Stat CG4 Lactic Acid, ED     Status: None   Collection Time: 11/09/16  3:13 AM  Result Value Ref Range   Lactic Acid, Venous 0.88 0.5 - 1.9 mmol/L  Urinalysis, Routine w reflex microscopic     Status: Abnormal   Collection Time: 11/09/16  5:45 AM  Result Value Ref Range   Color, Urine AMBER (A) YELLOW    Comment: BIOCHEMICALS MAY BE AFFECTED BY COLOR   APPearance HAZY (A) CLEAR   Specific Gravity, Urine 1.024 1.005 - 1.030   pH 5.0 5.0 - 8.0   Glucose, UA NEGATIVE NEGATIVE mg/dL   Hgb urine dipstick SMALL (A) NEGATIVE   Bilirubin Urine NEGATIVE NEGATIVE   Ketones, ur NEGATIVE NEGATIVE mg/dL   Protein, ur 100 (A) NEGATIVE mg/dL   Nitrite POSITIVE (A) NEGATIVE   Leukocytes, UA NEGATIVE NEGATIVE   RBC / HPF 6-30 0 - 5 RBC/hpf   WBC, UA 6-30 0 - 5 WBC/hpf   Bacteria, UA RARE (A) NONE SEEN   Squamous Epithelial / LPF NONE SEEN NONE SEEN   Mucous PRESENT    Ca Oxalate Crys, UA PRESENT     Dg Chest 2 View  Result Date: 11/09/2016 CLINICAL DATA:  Acute onset of generalized chest pain and shortness of breath. Bacteremia. Initial encounter. EXAM: CHEST  2 VIEW COMPARISON:  CT of the chest performed 11/04/2016 FINDINGS: New bilateral midlung airspace opacities raise concern for multifocal pneumonia. No pleural effusion or pneumothorax is seen. The heart is normal in size. No acute osseous abnormalities are seen. IMPRESSION: New bilateral midlung airspace opacities raise concern for multifocal pneumonia. Electronically Signed   By: Garald Balding M.D.   On: 11/09/2016 04:58   Dg Wrist 2 Views Right  Result Date: 11/09/2016 CLINICAL DATA:  Pain and swelling for 3 days. EXAM: RIGHT WRIST - 2 VIEW COMPARISON:  RIGHT hand radiograph July 22, 2010 FINDINGS: There is no evidence of fracture or dislocation. There is no evidence of  arthropathy or other focal bone abnormality. Soft tissues are unremarkable. IMPRESSION: Negative. Electronically Signed  By: Elon Alas M.D.   On: 11/09/2016 04:17   Dg Wrist Complete Left  Result Date: 11/09/2016 CLINICAL DATA:  Pain and swelling for 3 days. EXAM: LEFT WRIST - COMPLETE 3+ VIEW COMPARISON:  None. FINDINGS: There is no evidence of fracture or dislocation. There is no evidence of arthropathy or other focal bone abnormality. Soft tissues are unremarkable. IMPRESSION: Negative. Electronically Signed   By: Elon Alas M.D.   On: 11/09/2016 04:18   Dg Knee Complete 4 Views Right  Result Date: 11/09/2016 CLINICAL DATA:  Knee swelling for 3 days, no injury. EXAM: RIGHT KNEE - COMPLETE 4+ VIEW COMPARISON:  None. FINDINGS: No evidence of fracture, dislocation, or joint effusion. No evidence of arthropathy or other focal bone abnormality. Mild prepatellar soft tissue swelling. Small suprapatellar joint effusion. IMPRESSION: Soft-tissue swelling and small suprapatellar joint effusion. No acute osseous process or advanced degenerative change for age. Electronically Signed   By: Elon Alas M.D.   On: 11/09/2016 04:15    Review of Systems  Constitutional: Positive for malaise/fatigue.  Musculoskeletal: Positive for joint pain and myalgias.  All other systems reviewed and are negative.  Blood pressure 111/63, pulse 98, temperature 99.1 F (37.3 C), temperature source Oral, resp. rate 16, height 6' 2"  (1.88 m), weight 75.8 kg (167 lb), SpO2 98 %. Physical Exam  Constitutional: He is oriented to person, place, and time. He appears well-developed and well-nourished.  HENT:  Head: Atraumatic.  Eyes: EOM are normal.  Cardiovascular: Intact distal pulses.   Respiratory: Effort normal.  Musculoskeletal:  examination of the right knee shows no warmth erythema swelling or significant effusion. There is a medial puncture wound where he had a recent aspiration.He has painless range  of motion.  No calf tenderness or swelling.  Neurological: He is alert and oriented to person, place, and time.  Skin: Skin is warm and dry.  Psychiatric: He has a normal mood and affect.    Assessment/Plan: No clinical concern for right septic knee.  Aspiration last night is reassuring I will follow along and will await final culture results but I am very unconcerned at this point about the possibility of septic arthritis and the need for further aggressive management.  Recommend ice as needed for discomfort.  Nita Sells 11/09/2016, 8:04 AM

## 2016-11-09 NOTE — ED Provider Notes (Addendum)
[facility]-EMERGENCY DEPT Provider Note   CSN: [id number] Arrival date & time: 11/08/16  2232   By signing my name below, I, Clarisse GougeXavier Herndon, attest that this documentation has been prepared under the direction and in the presence of Eleina Jergens, Canary Brimhristopher J, MD. Electronically signed, Clarisse GougeXavier Herndon, ED Scribe. 11/09/16. 2:23 AM.   History   Chief Complaint Chief Complaint  Patient presents with  . Blood Infection   The history is provided by the patient and medical records. No language interpreter was used.    Cody Rios is a 26 y.o. male with h/o IV drug use presenting to the Emergency Department concerning R wrist weakness onset yesterday. Associated generalized pains to areas including the R > L wrist, R hip and bilateral knees and ankles with swelling on the knees and ankles. Pt describes severe, pressure like, constant, worsening pain worse with certain movements, contact and palpation. [facility] and called yesterday when he was informed that he had a staph infection. No recent abx use. Pt followed by [facility] following multiple past knee surgery. No other complaints at this time.  History reviewed. No pertinent past medical history.  There are no active problems to display for this patient.   Past Surgical History:  Procedure Laterality Date  . HERNIA REPAIR    . KNEE SURGERY         Home Medications    Prior to Admission medications   Medication Sig Start Date End Date Taking? Authorizing Provider  naproxen (NAPROSYN) 500 MG tablet Take 1 tablet (500 mg total) by mouth 2 (two) times daily. 11/04/16  Yes [name], [name], MD  azithromycin (ZITHROMAX) 250 MG tablet Take first 2 tablets together, then 1 every day until finished. Patient not taking: Reported on 11/09/2016 11/04/16   [name], [name], MD  ibuprofen (ADVIL,MOTRIN) 800 MG tablet Take 1 tablet (800 mg total) by mouth every 8 (eight) hours as needed. Patient taking differently: Take 800 mg by  mouth every 8 (eight) hours as needed for moderate pain.  11/08/16   [name], Helvi Royals, PA-C    Family History History reviewed. No pertinent family history.  Social History Social History  Substance Use Topics  . Smoking status: Never Smoker  . Smokeless tobacco: Current User    Types: Chew  . Alcohol use No     Allergies   Cephalosporins and Penicillins   Review of Systems Review of Systems  Constitutional: Positive for fatigue.  Respiratory: Negative for shortness of breath.   Cardiovascular: Negative for chest pain.  Gastrointestinal: Negative for nausea and vomiting.  Musculoskeletal: Positive for arthralgias, gait problem, joint swelling and myalgias.  Skin: Negative for color change and wound.  Neurological: Positive for weakness. Negative for numbness.  All other systems reviewed and are negative.    Physical Exam Updated Vital Signs BP 103/65 (BP Location: Left Arm)   Pulse 100   Temp 98.3 F (36.8 C) (Oral)   Resp 18   Ht 6\' 2"  (1.88 m)   Wt 150 lb (68 kg)   SpO2 99%   BMI 19.26 kg/m   Physical Exam  Constitutional: He is oriented to person, place, and time. He appears well-developed and well-nourished. No distress.  HENT:  Head: Normocephalic and atraumatic.  Right Ear: Hearing normal.  Left Ear: Hearing normal.  Nose: Nose normal.  Mouth/Throat: Oropharynx is clear and moist and mucous membranes are normal.  Eyes: Pupils are equal, round, and reactive to light. Conjunctivae and EOM are  normal.  Neck: Normal range of motion. Neck supple.  Cardiovascular: Regular rhythm, S1 normal and S2 normal.  Exam reveals no gallop and no friction rub.   No murmur heard. Pulmonary/Chest: Effort normal and breath sounds normal. No respiratory distress. He exhibits no tenderness.  Abdominal: Soft. Normal appearance and bowel sounds are normal. There is no hepatosplenomegaly. There is no tenderness. There is no rebound, no guarding, no tenderness at McBurney's  point and negative Murphy's sign. No hernia.  Musculoskeletal: Normal range of motion. He exhibits edema and tenderness.  Swelling and decreased ROM of R wrist with tenderness, ertythema over ulnar aspect. R knee with swelling, probable effusion, warmth, swelling and ROM 45 degrees flexion. Bilateral, symmetrical 1+ pitting edema to lower extremities.  Neurological: He is alert and oriented to person, place, and time. He has normal strength. No cranial nerve deficit or sensory deficit. Coordination normal. GCS eye subscore is 4. GCS verbal subscore is 5. GCS motor subscore is 6.  Skin: Skin is warm, dry and intact. No rash noted. No cyanosis.  Psychiatric: He has a normal mood and affect. His speech is normal and behavior is normal. Thought content normal.  Nursing note and vitals reviewed.    ED Treatments / Results  DIAGNOSTIC STUDIES: Oxygen Saturation is 99% on RA, NL by my interpretation.    COORDINATION OF CARE: 2:05 AM-Discussed next steps with pt. Pt verbalized understanding and is agreeable with the plan. Will perform joint aspiration, order TEE, consult specialists, order medications and prepare pt for admission.   Labs (all labs ordered are listed, but only abnormal results are displayed) Labs Reviewed  CBC WITH DIFFERENTIAL/PLATELET - Abnormal; Notable for the following:       Result Value   WBC 16.4 (*)    RBC 3.36 (*)    Hemoglobin 9.9 (*)    HCT 27.9 (*)    Neutro Abs 12.0 (*)    Monocytes Absolute 1.9 (*)    All other components within normal limits  COMPREHENSIVE METABOLIC PANEL - Abnormal; Notable for the following:    Glucose, Bld 113 (*)    Calcium 8.2 (*)    Total Protein 5.8 (*)    Albumin 2.7 (*)    AST 44 (*)    Alkaline Phosphatase 210 (*)    All other components within normal limits  SEDIMENTATION RATE - Abnormal; Notable for the following:    Sed Rate 42 (*)    All other components within normal limits  BRAIN NATRIURETIC PEPTIDE - Abnormal; Notable  for the following:    B Natriuretic Peptide 139.6 (*)    All other components within normal limits  CULTURE, BLOOD (ROUTINE X 2)  CULTURE, BLOOD (ROUTINE X 2)  BODY FLUID CULTURE  C-REACTIVE PROTEIN  HIV ANTIBODY (ROUTINE TESTING)  URINALYSIS, ROUTINE W REFLEX MICROSCOPIC  URIC ACID  I-STAT CG4 LACTIC ACID, ED  I-STAT TROPONIN, ED    EKG  EKG Interpretation None       Radiology Dg Chest 2 View  Result Date: 11/09/2016 CLINICAL DATA:  Acute onset of generalized chest pain and shortness of breath. Bacteremia. Initial encounter. EXAM: CHEST  2 VIEW COMPARISON:  CT of the chest performed 11/04/2016 FINDINGS: New bilateral midlung airspace opacities raise concern for multifocal pneumonia. No pleural effusion or pneumothorax is seen. The heart is normal in size. No acute osseous abnormalities are seen. IMPRESSION: New bilateral midlung airspace opacities raise concern for multifocal pneumonia. Electronically Signed   By: Beryle Beams.D.  On: 11/09/2016 04:58   Dg Wrist 2 Views Right  Result Date: 11/09/2016 CLINICAL DATA:  Pain and swelling for 3 days. EXAM: RIGHT WRIST - 2 VIEW COMPARISON:  RIGHT hand radiograph July 22, 2010 FINDINGS: There is no evidence of fracture or dislocation. There is no evidence of arthropathy or other focal bone abnormality. Soft tissues are unremarkable. IMPRESSION: Negative. Electronically Signed   By: [name] M.D.   On: 11/09/2016 04:17   Dg Wrist Complete Left  Result Date: 11/09/2016 CLINICAL DATA:  Pain and swelling for 3 days. EXAM: LEFT WRIST - COMPLETE 3+ VIEW COMPARISON:  None. FINDINGS: There is no evidence of fracture or dislocation. There is no evidence of arthropathy or other focal bone abnormality. Soft tissues are unremarkable. IMPRESSION: Negative. Electronically Signed   By: [name] M.D.   On: 11/09/2016 04:18   Dg Knee Complete 4 Views Right  Result Date: 11/09/2016 CLINICAL DATA:  Knee swelling for 3 days, no injury.  EXAM: RIGHT KNEE - COMPLETE 4+ VIEW COMPARISON:  None. FINDINGS: No evidence of fracture, dislocation, or joint effusion. No evidence of arthropathy or other focal bone abnormality. Mild prepatellar soft tissue swelling. Small suprapatellar joint effusion. IMPRESSION: Soft-tissue swelling and small suprapatellar joint effusion. No acute osseous process or advanced degenerative change for age. Electronically Signed   By: [name] M.D.   On: 11/09/2016 04:15    Procedures .Joint Aspiration/Arthrocentesis Date/Time: 11/09/2016 4:20 AM Performed by: Gilda Crease Authorized by: Gilda Crease   Consent:    Consent obtained:  Written   Consent given by:  Patient   Risks discussed:  Bleeding, infection and pain Universal protocol:    Procedure explained and questions answered to patient or proxy's satisfaction: yes     Relevant documents present and verified: yes     Test results available and properly labeled: yes     Imaging studies available: yes     Required blood products, implants, devices, and special equipment available: yes     Site/side marked: yes     Immediately prior to procedure, a time out was called: yes     Patient identity confirmed:  Verbally with patient Location:    Location:  Knee   Knee:  R knee Anesthesia (see MAR for exact dosages):    Anesthesia method:  Local infiltration   Local anesthetic:  Lidocaine 2% w/o epi Procedure details:    Preparation: Patient was prepped and draped in usual sterile fashion     Needle gauge:  18 G   Ultrasound guidance: no     Approach:  Medial   Aspirate amount:  Scant   Aspirate characteristics:  Clear   Steroid injected: no   Post-procedure details:    Dressing:  Adhesive bandage   Patient tolerance of procedure:  Tolerated well, no immediate complications   (including critical care time)  Angiocath insertion Performed by: Vraj Denardo J.  Consent: Verbal consent obtained. Risks and  benefits: risks, benefits and alternatives were discussed Time out: Immediately prior to procedure a "time out" was called to verify the correct patient, procedure, equipment, support staff and site/side marked as required.  Preparation: Patient was prepped and draped in the usual sterile fashion.  Vein Location: left upper arm  Ultrasound Guided  Gauge: 20  Normal blood return and flush without difficulty Patient tolerance: Patient tolerated the procedure well with no immediate complications.  CRITICAL CARE Performed by: Gilda Crease   Total critical care time: 30 minutes  Critical care time was exclusive of separately billable procedures and treating other patients.  Critical care was necessary to treat or prevent imminent or life-threatening deterioration.  Critical care was time spent personally by me on the following activities: development of treatment plan with patient and/or surrogate as well as nursing, discussions with consultants, evaluation of patient's response to treatment, examination of patient, obtaining history from patient or surrogate, ordering and performing treatments and interventions, ordering and review of laboratory studies, ordering and review of radiographic studies, pulse oximetry and re-evaluation of patient's condition.    Medications Ordered in ED Medications  sodium chloride 0.9 % bolus 1,000 mL (1,000 mLs Intravenous New Bag/Given 11/09/16 0230)  lidocaine (XYLOCAINE) 2 % (with pres) injection 400 mg (400 mg Infiltration Given by Other 11/09/16 0414)  vancomycin (VANCOCIN) IVPB 1000 mg/200 mL premix (1,000 mg Intravenous New Bag/Given 11/09/16 0400)     Initial Impression / Assessment and Plan / ED Course  I have reviewed the triage vital signs and the nursing notes.  Pertinent labs & imaging results that were available during my care of the patient were reviewed by me and considered in my medical decision making (see chart for  details).     Patient presents to the emergency department for evaluation of positive blood cultures. Patient was called at home and told to return to the ER. Patient had been seen in the ER several days ago with chest pain. CT of his chest was performed and did show multiple small foci of small infection. He was discharged on Zithromax. He returned one day ago because he was having intermittent fevers and experiencing malaise, swelling of the ankles and migratory arthralgias. Blood cultures performed at that visit ultimately were positive. Gram stain showed gram-positive cocci in clusters and cultures ultimately were positive for staph aureus.  Examination the patient today reveals pain, swelling and some redness of the right wrist. Additionally he had swelling of the knee and possible joint effusion noted. This raised concern for septic joints. Wrist discussed with Dr. Cheree Ditto ache. He has seen the patient in the ER. He did not feel that the wrist was clearly consistent with septic bursitis, recommended performing MRI and will follow along. Knee examination discussed with Dr. Ave Filter, on call for orthopedics. He is in agreement with seeing the patient in the morning. I did perform arthrocentesis. I was only able to obtain a very small amount of what appeared to be clear synovial fluid, the scant amount of fluid makes me less concerned for septic joint. Culture pending, not enough fluid obtained to perform full joint fluid analysis.  Reviewing the CAT scan from the other day reveals multiple small foci of inflammation and possible infection. This raises concern for septic emboli. Chest x-ray today shows evidence of multifocal pneumonia.  The patient's migratory pain in right and spots on his skin might be secondary to septic seeding as well. I do not hear a murmur at this time, but endocarditis is strongly considered.   Patient will require hospitalization for further management of staph aureus  bacteremia and multifocal pneumonia, likely from septic emboli.  Final Clinical Impressions(s) / ED Diagnoses   Final diagnoses:  Bacteremia  Community acquired pneumonia, unspecified laterality    New Prescriptions New Prescriptions   No medications on file  I personally performed the services described in this documentation, which was scribed in my presence. The recorded information has been reviewed and is accurate.     Gilda Crease, MD 11/09/16 [phone]  Gilda Crease, MD 11/09/16 [phone]

## 2016-11-10 ENCOUNTER — Inpatient Hospital Stay (HOSPITAL_COMMUNITY): Payer: Self-pay

## 2016-11-10 ENCOUNTER — Encounter (HOSPITAL_COMMUNITY): Payer: Self-pay | Admitting: General Practice

## 2016-11-10 ENCOUNTER — Inpatient Hospital Stay (HOSPITAL_COMMUNITY)
Admission: EM | Admit: 2016-11-10 | Discharge: 2016-11-17 | DRG: 288 | Disposition: A | Discharge status: Home / Self Care | Payer: Self-pay | Attending: Family Medicine | Admitting: Family Medicine

## 2016-11-10 DIAGNOSIS — M86071 Acute hematogenous osteomyelitis, right ankle and foot: Secondary | ICD-10-CM

## 2016-11-10 DIAGNOSIS — M25461 Effusion, right knee: Secondary | ICD-10-CM | POA: Diagnosis present

## 2016-11-10 DIAGNOSIS — J189 Pneumonia, unspecified organism: Secondary | ICD-10-CM | POA: Diagnosis present

## 2016-11-10 DIAGNOSIS — M65931 Unspecified synovitis and tenosynovitis, right forearm: Secondary | ICD-10-CM | POA: Diagnosis present

## 2016-11-10 DIAGNOSIS — I330 Acute and subacute infective endocarditis: Secondary | ICD-10-CM | POA: Insufficient documentation

## 2016-11-10 DIAGNOSIS — I071 Rheumatic tricuspid insufficiency: Secondary | ICD-10-CM

## 2016-11-10 DIAGNOSIS — R079 Chest pain, unspecified: Secondary | ICD-10-CM

## 2016-11-10 DIAGNOSIS — R8290 Unspecified abnormal findings in urine: Secondary | ICD-10-CM | POA: Diagnosis present

## 2016-11-10 DIAGNOSIS — I272 Pulmonary hypertension, unspecified: Secondary | ICD-10-CM | POA: Diagnosis present

## 2016-11-10 DIAGNOSIS — R7881 Bacteremia: Secondary | ICD-10-CM

## 2016-11-10 DIAGNOSIS — D649 Anemia, unspecified: Secondary | ICD-10-CM | POA: Diagnosis present

## 2016-11-10 DIAGNOSIS — R091 Pleurisy: Secondary | ICD-10-CM

## 2016-11-10 DIAGNOSIS — I079 Rheumatic tricuspid valve disease, unspecified: Secondary | ICD-10-CM

## 2016-11-10 DIAGNOSIS — I38 Endocarditis, valve unspecified: Secondary | ICD-10-CM | POA: Diagnosis present

## 2016-11-10 DIAGNOSIS — B9561 Methicillin susceptible Staphylococcus aureus infection as the cause of diseases classified elsewhere: Secondary | ICD-10-CM | POA: Diagnosis present

## 2016-11-10 DIAGNOSIS — Z8739 Personal history of other diseases of the musculoskeletal system and connective tissue: Secondary | ICD-10-CM

## 2016-11-10 DIAGNOSIS — I2690 Septic pulmonary embolism without acute cor pulmonale: Secondary | ICD-10-CM | POA: Diagnosis present

## 2016-11-10 DIAGNOSIS — M869 Osteomyelitis, unspecified: Secondary | ICD-10-CM

## 2016-11-10 DIAGNOSIS — M659 Synovitis and tenosynovitis, unspecified: Secondary | ICD-10-CM | POA: Diagnosis present

## 2016-11-10 DIAGNOSIS — F1110 Opioid abuse, uncomplicated: Secondary | ICD-10-CM | POA: Diagnosis present

## 2016-11-10 DIAGNOSIS — I368 Other nonrheumatic tricuspid valve disorders: Secondary | ICD-10-CM

## 2016-11-10 HISTORY — DX: Endocarditis, valve unspecified: I38

## 2016-11-10 HISTORY — DX: Rheumatic tricuspid insufficiency: I07.1

## 2016-11-10 HISTORY — DX: Rheumatic tricuspid valve disease, unspecified: I07.9

## 2016-11-10 HISTORY — DX: Methicillin susceptible Staphylococcus aureus infection as the cause of diseases classified elsewhere: B95.61

## 2016-11-10 HISTORY — DX: Bacteremia: R78.81

## 2016-11-10 LAB — PROTIME-INR
INR: 1.18
PROTHROMBIN TIME: 15.1 s (ref 11.4–15.2)

## 2016-11-10 LAB — APTT: aPTT: 36 seconds (ref 24–36)

## 2016-11-10 LAB — MAGNESIUM: MAGNESIUM: 1.9 mg/dL (ref 1.7–2.4)

## 2016-11-10 LAB — RETICULOCYTES
RBC.: 3.95 MIL/uL — ABNORMAL LOW (ref 4.22–5.81)
Retic Count, Absolute: 27.7 10*3/uL (ref 19.0–186.0)
Retic Ct Pct: 0.7 % (ref 0.4–3.1)

## 2016-11-10 LAB — CULTURE, BLOOD (ROUTINE X 2)
Special Requests: ADEQUATE
Special Requests: ADEQUATE
Special Requests: ADEQUATE

## 2016-11-10 LAB — VITAMIN B12: Vitamin B-12: 1379 pg/mL — ABNORMAL HIGH (ref 180–914)

## 2016-11-10 LAB — IRON AND TIBC
IRON: 42 ug/dL — AB (ref 45–182)
Saturation Ratios: 16 % — ABNORMAL LOW (ref 17.9–39.5)
TIBC: 259 ug/dL (ref 250–450)
UIBC: 217 ug/dL

## 2016-11-10 LAB — TSH: TSH: 0.346 u[IU]/mL — AB (ref 0.350–4.500)

## 2016-11-10 LAB — FOLATE: Folate: 18.2 ng/mL (ref >5.9)

## 2016-11-10 LAB — FERRITIN: FERRITIN: 276 ng/mL (ref 24–336)

## 2016-11-10 LAB — LACTIC ACID, PLASMA: Lactic Acid, Venous: 1.1 mmol/L (ref 0.5–1.9)

## 2016-11-10 MED ORDER — ONDANSETRON HCL 4 MG PO TABS: 4.0000 mg | ORAL_TABLET | Freq: Four times a day (QID) | ORAL | Status: DC | PRN

## 2016-11-10 MED ORDER — ONDANSETRON HCL 4 MG/2ML IJ SOLN: 4.0000 mg | Freq: Four times a day (QID) | INTRAMUSCULAR | Status: DC | PRN

## 2016-11-10 MED ORDER — VANCOMYCIN HCL IN DEXTROSE 1-5 GM/200ML-% IV SOLN
1000.0000 mg | Freq: Three times a day (TID) | INTRAVENOUS | Status: DC
  Administered 2016-11-10 – 2016-11-12 (×6): 1000 mg via INTRAVENOUS
  Filled 2016-11-10 (×7): qty 200

## 2016-11-10 MED ORDER — SODIUM CHLORIDE 0.9 % IV SOLN: INTRAVENOUS | Status: DC

## 2016-11-10 MED ORDER — ONDANSETRON 4 MG PO TBDP
4.0000 mg | ORAL_TABLET | Freq: Four times a day (QID) | ORAL | Status: DC | PRN
  Filled 2016-11-10: qty 1

## 2016-11-10 MED ORDER — MORPHINE SULFATE (PF) 4 MG/ML IV SOLN
1.0000 mg | INTRAVENOUS | Status: DC | PRN
  Administered 2016-11-10 (×2): 4 mg via INTRAVENOUS
  Administered 2016-11-10: 2 mg via INTRAVENOUS
  Administered 2016-11-10: 4 mg via INTRAVENOUS
  Administered 2016-11-11 (×2): 2 mg via INTRAVENOUS
  Filled 2016-11-10 (×6): qty 1

## 2016-11-10 MED ORDER — ACETAMINOPHEN 325 MG PO TABS
650.0000 mg | ORAL_TABLET | Freq: Four times a day (QID) | ORAL | Status: DC | PRN
  Administered 2016-11-11 – 2016-11-17 (×7): 650 mg via ORAL
  Filled 2016-11-10 (×7): qty 2

## 2016-11-10 MED ORDER — SODIUM CHLORIDE 0.9 % IV BOLUS (SEPSIS)
2000.0000 mL | Freq: Once | INTRAVENOUS | Status: AC
  Administered 2016-11-10: 2000 mL via INTRAVENOUS

## 2016-11-10 MED ORDER — DICYCLOMINE HCL 20 MG PO TABS: 20.0000 mg | ORAL_TABLET | Freq: Four times a day (QID) | ORAL | Status: AC | PRN

## 2016-11-10 MED ORDER — METHOCARBAMOL 500 MG PO TABS: 500.0000 mg | ORAL_TABLET | Freq: Three times a day (TID) | ORAL | Status: AC | PRN

## 2016-11-10 MED ORDER — CLONIDINE HCL 0.1 MG PO TABS
0.1000 mg | ORAL_TABLET | ORAL | Status: AC
  Administered 2016-11-12 – 2016-11-14 (×4): 0.1 mg via ORAL
  Filled 2016-11-10 (×4): qty 1

## 2016-11-10 MED ORDER — PANTOPRAZOLE SODIUM 40 MG PO TBEC
40.0000 mg | DELAYED_RELEASE_TABLET | Freq: Every day | ORAL | Status: DC
  Administered 2016-11-11 – 2016-11-17 (×7): 40 mg via ORAL
  Filled 2016-11-10 (×7): qty 1

## 2016-11-10 MED ORDER — CLONIDINE HCL 0.1 MG PO TABS
0.1000 mg | ORAL_TABLET | Freq: Every day | ORAL | Status: AC
  Administered 2016-11-15 – 2016-11-16 (×2): 0.1 mg via ORAL
  Filled 2016-11-10 (×2): qty 1

## 2016-11-10 MED ORDER — HYDROXYZINE HCL 25 MG PO TABS: 25.0000 mg | ORAL_TABLET | Freq: Four times a day (QID) | ORAL | Status: AC | PRN

## 2016-11-10 MED ORDER — SODIUM CHLORIDE 0.9 % IV SOLN
INTRAVENOUS | Status: DC
  Administered 2016-11-10: 23:00:00 via INTRAVENOUS

## 2016-11-10 MED ORDER — DEXTROSE 5 % IV SOLN: 2.0000 g | Freq: Three times a day (TID) | INTRAVENOUS | Status: DC

## 2016-11-10 MED ORDER — SODIUM CHLORIDE 0.9% FLUSH
3.0000 mL | Freq: Two times a day (BID) | INTRAVENOUS | Status: DC
  Administered 2016-11-10 – 2016-11-17 (×10): 3 mL via INTRAVENOUS

## 2016-11-10 MED ORDER — ACETAMINOPHEN 650 MG RE SUPP: 650.0000 mg | Freq: Four times a day (QID) | RECTAL | Status: DC | PRN

## 2016-11-10 MED ORDER — ENOXAPARIN SODIUM 40 MG/0.4ML ~~LOC~~ SOLN
40.0000 mg | SUBCUTANEOUS | Status: DC
  Administered 2016-11-10 – 2016-11-13 (×4): 40 mg via SUBCUTANEOUS
  Filled 2016-11-10 (×5): qty 0.4

## 2016-11-10 MED ORDER — KETOROLAC TROMETHAMINE 15 MG/ML IJ SOLN
15.0000 mg | Freq: Four times a day (QID) | INTRAMUSCULAR | Status: AC
  Administered 2016-11-10 – 2016-11-12 (×2): 15 mg via INTRAVENOUS
  Filled 2016-11-10 (×4): qty 1

## 2016-11-10 MED ORDER — DEXTROSE 5 % IV SOLN
2.0000 g | Freq: Once | INTRAVENOUS | Status: AC
  Administered 2016-11-10: 2 g via INTRAVENOUS
  Filled 2016-11-10: qty 2

## 2016-11-10 MED ORDER — CLONIDINE HCL 0.1 MG PO TABS
0.1000 mg | ORAL_TABLET | Freq: Four times a day (QID) | ORAL | Status: AC
  Administered 2016-11-10 – 2016-11-12 (×5): 0.1 mg via ORAL
  Filled 2016-11-10 (×7): qty 1

## 2016-11-10 MED ORDER — IOPAMIDOL (ISOVUE-370) INJECTION 76%
INTRAVENOUS | Status: AC
  Filled 2016-11-10: qty 100

## 2016-11-10 MED ORDER — ONDANSETRON HCL 4 MG/2ML IJ SOLN: 4.0000 mg | Freq: Three times a day (TID) | INTRAMUSCULAR | Status: DC | PRN

## 2016-11-10 MED ORDER — LOPERAMIDE HCL 2 MG PO CAPS: 2.0000 mg | ORAL_CAPSULE | ORAL | Status: AC | PRN

## 2016-11-10 MED ORDER — VANCOMYCIN HCL IN DEXTROSE 1-5 GM/200ML-% IV SOLN: 1000.0000 mg | Freq: Once | INTRAVENOUS | Status: DC

## 2016-11-10 MED ORDER — OXYCODONE HCL 5 MG PO TABS
5.0000 mg | ORAL_TABLET | ORAL | Status: DC | PRN
  Administered 2016-11-10: 5 mg via ORAL
  Filled 2016-11-10: qty 1

## 2016-11-10 MED ORDER — IOPAMIDOL (ISOVUE-370) INJECTION 76%
100.0000 mL | Freq: Once | INTRAVENOUS | Status: AC | PRN
  Administered 2016-11-10: 100 mL via INTRAVENOUS

## 2016-11-10 MED ORDER — VANCOMYCIN HCL IN DEXTROSE 1-5 GM/200ML-% IV SOLN
1000.0000 mg | Freq: Once | INTRAVENOUS | Status: AC
  Administered 2016-11-10: 1000 mg via INTRAVENOUS
  Filled 2016-11-10: qty 200

## 2016-11-10 MED ORDER — NAPROXEN 250 MG PO TABS: 500.0000 mg | ORAL_TABLET | Freq: Two times a day (BID) | ORAL | Status: DC | PRN

## 2016-11-10 NOTE — Consult Note (Signed)
[facility]    Date of Admission:  11/10/2016   Total days of antibiotics 2        Day 2 Vancomycin (started at [facility] 8/8)        Aztreonam stopped 8/9               Reason for Consult: Follow up for mssa bactermia and endocarditis    Referring Provider: Dr. 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 Patient is afebrile, tachycardic (110-140), tachpnic (21-28), and normotensive 106-120/62-82. CT angiogram today 8/9 showed bilateral sub-segmental emboli and cavitary pneumonia. Small amount of pericardial effusion also present. Ct chest 8/8 shows bilateral airspace opacities consistent with multifocal pneumonia. Echo 8/8 shows presence of vegetation on tricuspid valve and increased pa peak pressure consistent with pulm htn. -continue vancomycin for mssa due to patient's allergy to cephalosporin and pcn. The patient stated that he supposedly had the allergy as a child and was told that he should not get cephalosporins or pcn as he gets rashes, hives, and is not able to breathe -Discontinue aztreonam as his symptoms are all likely from mssa -abx to be continued for 6 weeks due to presence of endocarditis -repeat blood culture to check for growth -counsel regarding heroin and other possible substance abuse -Get TEE if aggressive cardiac interventions are planned. Otherwise not necessary as it will not change management.  -Possible metastatic seeding to right knee and right foot.   -Aspirate right knee   -MRI of right foot.  -Do not place any lines till no growth for at least 4 days      [facility] Staphylococcus aureus bacteremia   Staphylococcus aureus bacteremia (SAB) is  associated with a high rate of complications and mortality.  Specific aspects of clinical management are critical to optimizing the outcome of patients with SAB.  Therefore, the [facility] Antimicrobial Management Team [facility]) has initiated an intervention aimed at improving the management of SAB at [facility].  To do so, Infectious Diseases physicians are providing an evidence-based consult for the management of all patients with SAB.     Yes No Comments  Perform follow-up blood cultures (even if the patient is afebrile) to ensure clearance of bacteremia [x]  []    Remove vascular catheter and obtain follow-up blood cultures after the removal of the catheter [x]  []    Perform echocardiography to evaluate for endocarditis (transthoracic ECHO is 40-50% sensitive, TEE is > 90% sensitive) [x]  []  Please keep in mind, that neither test can definitively EXCLUDE endocarditis, and that should clinical suspicion remain high for endocarditis the patient should then still be treated with an "endocarditis" duration of therapy = 6 weeks  Consult electrophysiologist to evaluate implanted cardiac device (pacemaker, ICD) []  [x]    Ensure source control []  []  Have all abscesses been drained effectively? Have deep seeded infections (septic joints or osteomyelitis) had appropriate surgical debridement?  Investigate for "metastatic" sites of infection [x]  []  Does the patient have ANY symptom or physical exam finding that would suggest a deeper infection (back or neck pain that may be suggestive of vertebral osteomyelitis or epidural abscess, muscle pain that could be a symptom of pyomyositis)?  Keep in mind that for deep seeded infections MRI  imaging with contrast is preferred rather than other often insensitive tests such as plain x-rays, especially early in a patient's presentation.  Change antibiotic therapy to __________________ [x]  []  Beta-lactam antibiotics are preferred for MSSA due to higher cure rates.   If on  Vancomycin, goal trough should be 15 - 20 mcg/mL  Estimated duration of IV antibiotic therapy:   []  []  Consult case management for probably prolonged outpatient IV antibiotic therapy    Tenosynovitis of right wrist -MRI of right wrist 8/8 at [facility] revealed mild tenosynovitis of abductor pollicis longus, flexor carpi radialis, and extensor carpi ulnaris and small amount of joint effusion at radiocarpal joint. -Consider joint aspiration for septic arthritis and synovial fluid analysis   History of juvenile rheumatoid arthritis  -self reported  -pt has stopped taking sulfasalazine    . cloNIDine  0.1 mg Oral QID   Followed by  . [START ON 11/12/2016] cloNIDine  0.1 mg Oral BH-qamhs   Followed by  . [START ON 11/15/2016] cloNIDine  0.1 mg Oral QAC breakfast  . ketorolac  15 mg Intravenous Q6H  . pantoprazole  40 mg Oral Daily    HPI: Cody Rios is a 26 y.o. 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 He has been seen for these symptoms several times over the past few weeks and been treated with azithromycin. Last week the patient developed worsening pain in his chest, shoulder, knee, and wrist and was evaluated for a possible blood stream infection. Prior to the blood cultures resulting the patient refused admission and left ama. Once the blood cultures resulted positive for mssa he was called to return to [facility] for treatment on 8/8. The patient was diagnosed with mssa bacteremia, tenosynovitis of the right wrist, and evaluated for his right knee. The patient's mssa is thought to be due to his heroin use (ues at least 1g daily 4-5x per day) He was started on IV vancomycin, but he once again left ama because he was withdrawing from heroin and felt that the clonidine was not helping him.  This morning (8/9) [facility] stating that his chest pain was sharp in nature,  intermittent, and 9/10 intensity.   In the ed the patient was given iv azactam and iv vancomycin to cover for hcap and mssa considering the patient's pcn and cefalosporin allergy. CTA of chest was ordered.  Review of Systems: Review of Systems  Constitutional: Positive for chills, fever and malaise/fatigue.  Musculoskeletal: Positive for joint pain (right knee and right foot) and myalgias.     Past Medical History:  Diagnosis Date  . Hepatitis C     Social History  Substance Use Topics  . Smoking status: Never Smoker  . Smokeless tobacco: Current User    Types: Chew  . Alcohol use No   Heroin user. Uses at least 1g daily (4-5x daily)  History reviewed. No pertinent family history. Allergies  Allergen Reactions  . Cephalosporins Other (See Comments)    unknown  . Penicillins Nausea And Vomiting and Other (See Comments)    Childhood allergy     OBJECTIVE: Blood pressure 112/72, pulse (!) 140, temperature 98.5 F (36.9 C), resp. rate (!) 22, SpO2 100 %.  Physical Exam  Constitutional: He is well-developed, well-nourished, and in no distress.  HENT:  Head: Normocephalic and atraumatic.  Eyes: Conjunctivae are normal.  Cardiovascular: Normal rate, regular  rhythm and normal heart sounds.   Pulmonary/Chest: Effort normal and breath sounds normal. No respiratory distress. He has no wheezes.  Abdominal: He exhibits no distension. There is no tenderness.  Musculoskeletal: He exhibits edema and tenderness (Right knee and right foot).  Psychiatric: Mood, memory, affect and judgment normal.    Lab Results Lab Results  Component Value Date   WBC 17.6 (H) 11/09/2016   HGB 9.9 (L) 11/09/2016   HCT 29.1 (L) 11/09/2016   MCV 82.7 11/09/2016   PLT 241 11/09/2016    Lab Results  Component Value Date   CREATININE 0.59 (L) 11/09/2016   BUN 6 11/09/2016   NA 133 (L) 11/09/2016   K 4.0 11/09/2016   CL 101 11/09/2016   CO2 25 11/09/2016    Lab Results  Component Value Date    ALT 52 11/09/2016   AST 44 (H) 11/09/2016   ALKPHOS 210 (H) 11/09/2016   BILITOT 1.2 11/09/2016     Microbiology: Recent Results (from the past 240 hour(s))  Culture, blood (routine x 2)     Status: Abnormal   Collection Time: 11/08/16 12:15 AM  Result Value Ref Range Status   Specimen Description BLOOD RIGHT ARM  Final   Special Requests   Final    BOTTLES DRAWN AEROBIC AND ANAEROBIC Blood Culture adequate volume   Culture  Setup Time   Final    GRAM POSITIVE COCCI IN CLUSTERS IN BOTH AEROBIC AND ANAEROBIC BOTTLES CRITICAL RESULT CALLED TO, READ BACK BY AND VERIFIED WITH: A. HARTLEY RN 11/18/16 1835 BEAMJ    Culture (A)  Final    STAPHYLOCOCCUS AUREUS SUSCEPTIBILITIES PERFORMED ON PREVIOUS CULTURE WITHIN THE LAST 5 DAYS. Performed at [facility], [address] [place], [place] [address]    Report Status 11/10/2016 FINAL  Final  Culture, blood (routine x 2)     Status: Abnormal   Collection Time: 11/08/16 12:18 AM  Result Value Ref Range Status   Specimen Description BLOOD BLOOD RIGHT HAND  Final   Special Requests IN PEDIATRIC BOTTLE Blood Culture adequate volume  Final   Culture  Setup Time   Final    GRAM POSITIVE COCCI IN CLUSTERS IN PEDIATRIC BOTTLE CRITICAL VALUE NOTED.  VALUE IS CONSISTENT WITH PREVIOUSLY REPORTED AND CALLED VALUE. Performed at [facility], [address] [place], [place] [address]    Culture STAPHYLOCOCCUS AUREUS (A)  Final   Report Status 11/10/2016 FINAL  Final   Organism ID, Bacteria STAPHYLOCOCCUS AUREUS  Final      Susceptibility   Staphylococcus aureus - MIC*    CIPROFLOXACIN <=0.5 SENSITIVE Sensitive     ERYTHROMYCIN >=8 RESISTANT Resistant     GENTAMICIN <=0.5 SENSITIVE Sensitive     OXACILLIN 0.5 SENSITIVE Sensitive     TETRACYCLINE <=1 SENSITIVE Sensitive     VANCOMYCIN <=0.5 SENSITIVE Sensitive     TRIMETH/SULFA <=10 SENSITIVE Sensitive     CLINDAMYCIN RESISTANT Resistant     RIFAMPIN <=0.5 SENSITIVE Sensitive       Inducible Clindamycin POSITIVE Resistant     * STAPHYLOCOCCUS AUREUS  Blood Culture ID Panel (Reflexed)     Status: Abnormal   Collection Time: 11/08/16 12:18 AM  Result Value Ref Range Status   Enterococcus species NOT DETECTED NOT DETECTED Final   Listeria monocytogenes NOT DETECTED NOT DETECTED Final   Staphylococcus species DETECTED (A) NOT DETECTED Final    Comment: CRITICAL RESULT CALLED TO, READ BACK BY AND VERIFIED WITH: A. HARTLEY RN 11/08/16  1835 BEAMJ    Staphylococcus aureus DETECTED (A) NOT DETECTED Final    Comment: Methicillin (oxacillin) susceptible Staphylococcus aureus (MSSA). Preferred therapy is anti staphylococcal beta lactam antibiotic (Cefazolin or Nafcillin), unless clinically contraindicated. CRITICAL RESULT CALLED TO, READ BACK BY AND VERIFIED WITH: A. HARTLEY RN 11/08/16 1835 BEAMJ    Methicillin resistance NOT DETECTED NOT DETECTED Final   Streptococcus species NOT DETECTED NOT DETECTED Final   Streptococcus agalactiae NOT DETECTED NOT DETECTED Final   Streptococcus pneumoniae NOT DETECTED NOT DETECTED Final   Streptococcus pyogenes NOT DETECTED NOT DETECTED Final   Acinetobacter baumannii NOT DETECTED NOT DETECTED Final   Enterobacteriaceae species NOT DETECTED NOT DETECTED Final   Enterobacter cloacae complex NOT DETECTED NOT DETECTED Final   Escherichia coli NOT DETECTED NOT DETECTED Final   Klebsiella oxytoca NOT DETECTED NOT DETECTED Final   Klebsiella pneumoniae NOT DETECTED NOT DETECTED Final   Proteus species NOT DETECTED NOT DETECTED Final   Serratia marcescens NOT DETECTED NOT DETECTED Final   Haemophilus influenzae NOT DETECTED NOT DETECTED Final   Neisseria meningitidis NOT DETECTED NOT DETECTED Final   Pseudomonas aeruginosa NOT DETECTED NOT DETECTED Final   Candida albicans NOT DETECTED NOT DETECTED Final   Candida glabrata NOT DETECTED NOT DETECTED Final   Candida krusei NOT DETECTED NOT DETECTED Final   Candida parapsilosis NOT  DETECTED NOT DETECTED Final   Candida tropicalis NOT DETECTED NOT DETECTED Final    Comment: Performed at [facility], [address] [place], [place] [address]  Culture, blood (Routine X 2) w Reflex to ID Panel     Status: Abnormal   Collection Time: 11/09/16  2:58 AM  Result Value Ref Range Status   Specimen Description BLOOD LEFT ANTECUBITAL  Final   Special Requests   Final    BOTTLES DRAWN AEROBIC ONLY Blood Culture adequate volume   Culture  Setup Time   Final    GRAM POSITIVE COCCI IN CLUSTERS AEROBIC BOTTLE ONLY CRITICAL RESULT CALLED TO, READ BACK BY AND VERIFIED WITH: PHARMD M BELL [id number] MLM    Culture (A)  Final    STAPHYLOCOCCUS AUREUS SUSCEPTIBILITIES PERFORMED ON PREVIOUS CULTURE WITHIN THE LAST 5 DAYS. Performed at [facility], [address], [place], [place] [address]    Report Status 11/10/2016 FINAL  Final  Body fluid culture     Status: None (Preliminary result)   Collection Time: 11/09/16  4:12 AM  Result Value Ref Range Status   Specimen Description SYNOVIAL  Final   Special Requests NONE  Final   Gram Stain   Final    ABUNDANT WBC PRESENT,BOTH PMN AND MONONUCLEAR NO ORGANISMS SEEN    Culture   Final    NO GROWTH 1 DAY INTERPRET RESULTS WITH CAUTION DUE TO LIMITED SPECIMEN VOLUME Performed at [facility], [address] [place], [place] [address]    Report Status PENDING  Incomplete  Culture, blood (Routine X 2) w Reflex to ID Panel     Status: None (Preliminary result)   Collection Time: 11/09/16  7:44 AM  Result Value Ref Range Status   Specimen Description BLOOD RIGHT ARM  Final   Special Requests IN PEDIATRIC BOTTLE Blood Culture adequate volume  Final   Culture  Setup Time   Final    GRAM POSITIVE COCCI IN CLUSTERS IN PEDIATRIC BOTTLE CRITICAL RESULT CALLED TO, READ BACK BY AND VERIFIED WITH: G.ABBOTT, PHARMD 11/10/16 0017 L.CHAMPION    Culture   Final  GRAM POSITIVE COCCI IN CLUSTERS CULTURE REINCUBATED FOR BETTER  GROWTH Performed at [facility], [address] [place], [place] [address]    Report Status PENDING  Incomplete    Lorenso CourierVahini Izumi Mixon, MD [facility] [phone] pager   [phone] cell 11/10/2016, 10:29 AM

## 2016-11-10 NOTE — ED Provider Notes (Signed)
[facility]-EMERGENCY DEPT Provider Note   CSN: [id number] Arrival date & time: 11/09/16  2152     History   Chief Complaint Chief Complaint  Patient presents with  . Chest Pain    HPI Cody Rios is a 26 y.o. male.  HPI [facility] being [facility] with diagnosis of endocarditis with vegetation noted on echo. Left yesterday at approx 5 pm. Cody FlesherWent and used heroin, now checking back into ER requesting inpatient treatment. Still with CP and SOB. No other complaints at this time   Past Medical History:  Diagnosis Date  . Hepatitis C     Patient Active Problem List   Diagnosis Date Noted  . Bacteremia 11/09/2016    Past Surgical History:  Procedure Laterality Date  . HERNIA REPAIR    . KNEE SURGERY         Home Medications    Prior to Admission medications   Medication Sig Start Date End Date Taking? Authorizing Provider  azithromycin (ZITHROMAX) 250 MG tablet Take first 2 tablets together, then 1 every day until finished. Patient not taking: Reported on 11/09/2016 11/04/16   Cody Rios, [name], MD  ibuprofen (ADVIL,MOTRIN) 800 MG tablet Take 1 tablet (800 mg total) by mouth every 8 (eight) hours as needed. Patient not taking: Reported on 11/10/2016 11/08/16   Cody Rios, Christopher, Rios  naproxen (NAPROSYN) 500 MG tablet Take 1 tablet (500 mg total) by mouth 2 (two) times daily. Patient not taking: Reported on 11/10/2016 11/04/16   Cody Rios, [name], MD    Family History History reviewed. No pertinent family history.  Social History Social History  Substance Use Topics  . Smoking status: Never Smoker  . Smokeless tobacco: Current User    Types: Chew  . Alcohol use No     Allergies   Cephalosporins and Penicillins   Review of Systems Review of Systems  All other systems reviewed and are negative.    Physical Exam Updated Vital Signs BP 134/79   Pulse (!) 118   Temp 98.5 F (36.9 C)   Resp (!) 24   SpO2 100%   Physical Exam  Constitutional: He is  oriented to person, place, and time. He appears well-developed and well-nourished.  HENT:  Head: Normocephalic and atraumatic.  Eyes: EOM are normal.  Neck: Normal range of motion.  Cardiovascular: Regular rhythm and intact distal pulses.   tachycardia  Pulmonary/Chest: Effort normal and breath sounds normal. No respiratory distress.  Abdominal: Soft. He exhibits no distension. There is no tenderness.  Neurological: He is alert and oriented to person, place, and time.  Skin: Skin is warm and dry.  Psychiatric: He has a normal mood and affect. Judgment normal.  Nursing note and vitals reviewed.    ED Treatments / Results  Labs (all labs ordered are listed, but only abnormal results are displayed) Labs Reviewed  BASIC METABOLIC PANEL - Abnormal; Notable for the following:       Result Value   Sodium 133 (*)    Glucose, Bld 115 (*)    Creatinine, Ser 0.59 (*)    Calcium 8.0 (*)    All other components within normal limits  CBC - Abnormal; Notable for the following:    WBC 17.6 (*)    RBC 3.52 (*)    Hemoglobin 9.9 (*)    HCT 29.1 (*)    All other components within normal limits  I-STAT TROPONIN, ED    EKG  EKG Interpretation  Date/Time:  Wednesday November 09 2016 22:04:13 EDT Ventricular Rate:  104 PR Interval:  140 QRS Duration: 100 QT Interval:  344 QTC Calculation: 452 R Axis:   78 Text Interpretation:  Sinus tachycardia Incomplete right bundle branch block Left ventricular hypertrophy Abnormal ECG No significant change was found Confirmed by Azalia Bilis ([id number]) on 11/10/2016 5:59:02 AM       Radiology Dg Chest 2 View  Result Date: 11/09/2016 CLINICAL DATA:  Acute onset of generalized chest pain and shortness of breath. Bacteremia. Initial encounter. EXAM: CHEST  2 VIEW COMPARISON:  CT of the chest performed 11/04/2016 FINDINGS: New bilateral midlung airspace opacities raise concern for multifocal pneumonia. No pleural effusion or pneumothorax is seen. The heart is  normal in size. No acute osseous abnormalities are seen. IMPRESSION: New bilateral midlung airspace opacities raise concern for multifocal pneumonia. Electronically Signed   By: [name] M.D.   On: 11/09/2016 04:58   Dg Wrist 2 Views Right  Result Date: 11/09/2016 CLINICAL DATA:  Pain and swelling for 3 days. EXAM: RIGHT WRIST - 2 VIEW COMPARISON:  RIGHT hand radiograph July 22, 2010 FINDINGS: There is no evidence of fracture or dislocation. There is no evidence of arthropathy or other focal bone abnormality. Soft tissues are unremarkable. IMPRESSION: Negative. Electronically Signed   By: [name] M.D.   On: 11/09/2016 04:17   Dg Wrist Complete Left  Result Date: 11/09/2016 CLINICAL DATA:  Pain and swelling for 3 days. EXAM: LEFT WRIST - COMPLETE 3+ VIEW COMPARISON:  None. FINDINGS: There is no evidence of fracture or dislocation. There is no evidence of arthropathy or other focal bone abnormality. Soft tissues are unremarkable. IMPRESSION: Negative. Electronically Signed   By: [name] M.D.   On: 11/09/2016 04:18   Mr Wrist Right Wo Contrast  Result Date: 11/09/2016 CLINICAL DATA:  History of IV drug abuse. Right wrist pain, concern for infection. EXAM: MR OF THE RIGHT WRIST WITHOUT CONTRAST TECHNIQUE: Multiplanar, multisequence MR imaging of the right wrist was performed. No intravenous contrast was administered. COMPARISON:  Right wrist x-rays dated November 09, 2016. FINDINGS: Limited and incomplete examination due to patient's inability to tolerate further sequences secondary to pain. Ligaments: Intact scapholunate and lunotriquetral ligaments. Triangular fibrocartilage: Intact TFCC. Tendons: There is trace fluid in the adductor pollicis longus tendon sheath at the level of the distal scaphoid. There is fluid within the flexor carpi the radialis longus and brevis tendon sheaths at the level of Lister's tubercle. Mild thickening of the extensor carpi ulnaris tendon with a small  amount of fluid in its tendon sheath. The flexor tendons are intact. Carpal tunnel/median nerve: Normal carpal tunnel. Normal median nerve. Guyon's canal: Normal. Joint/cartilage: Small radiocarpal joint effusion. No definite chondral defect. Bones/carpal alignment: No marrow signal abnormality. Normal alignment. No aggressive osseous lesion. Other: No fluid collection or hematoma. IMPRESSION: 1. Limited and incomplete examination due to patient's inability to tolerate further sequences secondary to pain. 2. Mild tenosynovitis of the abductor pollicis longus, flexor carpi the radialis, and extensor carpi ulnaris. Infectious tenosynovitis cannot be excluded on the basis of imaging alone. 3. Small radiocarpal joint effusion, nonspecific. If there is clinical concern for septic arthritis, joint aspiration is recommended. Electronically Signed   By: Obie Dredge M.D.   On: 11/09/2016 11:45   Dg Knee Complete 4 Views Right  Result Date: 11/09/2016 CLINICAL DATA:  Knee swelling for 3 days, no injury. EXAM: RIGHT KNEE - COMPLETE 4+ VIEW COMPARISON:  None. FINDINGS: No evidence of fracture, dislocation, or joint  effusion. No evidence of arthropathy or other focal bone abnormality. Mild prepatellar soft tissue swelling. Small suprapatellar joint effusion. IMPRESSION: Soft-tissue swelling and small suprapatellar joint effusion. No acute osseous process or advanced degenerative change for age. Electronically Signed   By: [name] M.D.   On: 11/09/2016 04:15    Procedures Procedures (including critical care time)  Medications Ordered in ED Medications  vancomycin (VANCOCIN) IVPB 1000 mg/200 mL premix (not administered)     Initial Impression / Assessment and Plan / ED Course  I have reviewed the triage vital signs and the nursing notes.  Pertinent labs & imaging results that were available during my care of the patient were reviewed by me and considered in my medical decision making (see chart for  details).     6:03 AM Pt will be admitted for ongoing treatment and management. Will place on catapres protocol for narcotic withdrawl  Final Clinical Impressions(s) / ED Diagnoses   Final diagnoses:  Acute bacterial endocarditis    New Prescriptions New Prescriptions   No medications on file     Azalia Bilis, MD 11/10/16 [id number]

## 2016-11-10 NOTE — Progress Notes (Addendum)
This is a no charge note  Pending admission per Dr. Dr. Patria Maneampos  26 year old male with past medical history of IVDU, heroin abuse, HCV, [facility] MSSA bacteremia and TV endocarditis with pulmonary septic emboli. ID, Dr. Drue SecondSnider saw patient and recommended vancomycin, but pt left on AMA, used heroin and comes back with chest pain and SOB. WBC 17.6, temperature 99.1, tachycardia, tachypnea, chest x-ray showed multifocal infiltration, negative urinalysis, electrolytes renal function okay. Patient is admitted to stepdown bed as inpatient. IV vancomycin was started.   Lorretta HarpXilin Marqual Mi, MD  [facility] Pager [phone]  If 7PM-7AM, please contact night-coverage [email or web address] Password [email or web address] 11/10/2016, 6:27 AM

## 2016-11-10 NOTE — Consult Note (Addendum)
Cardiology Consultation:   Patient ID: Cody Rios; [id number]; 07-17-1990   Admit date: 11/10/2016 Date of Consult: 11/15/2016  Primary Care Provider: System, [name] Primary Cardiologist: new - Dr. Jens Som Primary Electrophysiologist:     Patient Profile:   Cody Rios is a 26 y.o. male with a hx of MSSA endocarditis of tricuspid valve, pulmonary hypertension, heroin addiction, and hepatitis C is being seen today for the evaluation of endocarditis at the request of Dr. Clyde Lundborg.  History of Present Illness:   Cody Rios has a history of IVDU (heroin). He has been seen in the ER several times since 10/17/16. He was initially evaluated for chest pain thought to be pleurisy. Chest pain was worse with movement. We was evaluated several times in the ED since that visit and was found to have tricuspid valve endocarditis. Blood cultures positive for MSSA. He was called back to the ER for treatment. On arrival, ID was re-engaged for ABX selection. Ortho consulted and determined right wrist was without abscess and likely tenosynovitis. Right knee aspiration was also collected; awaiting cultures. 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   On my interview, he continues to complain of chest wall pain that is worse with upper body movement or arm movement. This pain is partially reproducible on exam.   Past Medical History:  Diagnosis Date  . Endocarditis   . Hepatitis C   . Pneumonia 11/10/2016    Past Surgical History:  Procedure Laterality Date  . HERNIA REPAIR    . KNEE SURGERY    . TEE WITHOUT CARDIOVERSION N/A 11/14/2016   Procedure: TRANSESOPHAGEAL ECHOCARDIOGRAM (TEE);  Surgeon: Vesta Mixer, MD;  Location: [facility] ENDOSCOPY;  Service: Cardiovascular;  Laterality: N/A;     Inpatient Medications: Scheduled Meds: . buprenorphine-naloxone  1 tablet Sublingual BID  . cloNIDine  0.1 mg Oral QAC breakfast  . enoxaparin (LOVENOX) injection  40 mg Subcutaneous  Q24H  . pantoprazole  40 mg Oral Daily  . sodium chloride flush  3 mL Intravenous Q12H   Continuous Infusions: . vancomycin Stopped (11/15/16 1027)   PRN Meds: acetaminophen **OR** acetaminophen, ondansetron **OR** ondansetron (ZOFRAN) IV  Allergies:    Allergies  Allergen Reactions  . Cephalosporins Hives  . Penicillins Hives, Shortness Of Breath and Rash    Social History:   Social History   Social History  . Marital status: Single    Spouse name: N/A  . Number of children: N/A  . Years of education: N/A   Occupational History  . Not on file.   Social History Main Topics  . Smoking status: Never Smoker  . Smokeless tobacco: Current User    Types: Chew  . Alcohol use No  . Drug use: Yes    Types: IV, Heroin     Comment: Last used: 22:00   . Sexual activity: Not on file   Other Topics Concern  . Not on file   Social History Narrative  . No narrative on file    Family History:   Family History  Problem Relation Age of Onset  . Hypertension Father     Pt thinks his father may have had high blood pressure, but is unsure.   ROS:  Please see the history of present illness.  ROS  All other ROS reviewed and negative.     Physical Exam/Data:   Vitals:   11/14/16 2359 11/15/16 0421 11/15/16 0900 11/15/16 1300  BP: (!) 103/56 106/65 114/67 101/60  Pulse: 99 93  91 84  Resp: 18 20 18 18   Temp: 99.4 F (37.4 C) 98.9 F (37.2 C) 99 F (37.2 C) 98.4 F (36.9 C)  TempSrc: Oral Oral Oral Oral  SpO2: 95% 96% 100% 100%  Weight:  162 lb 0.6 oz (73.5 kg)    Height:        Intake/Output Summary (Last 24 hours) at 11/15/16 1452 Last data filed at 11/15/16 0525  Gross per 24 hour  Intake             1490 ml  Output                0 ml  Net             1490 ml   Filed Weights   11/14/16 0623 11/14/16 1136 11/15/16 0421  Weight: 159 lb 9.6 oz (72.4 kg) 159 lb (72.1 kg) 162 lb 0.6 oz (73.5 kg)   Body mass index is 20.8 kg/m.  General:  Well nourished,  well developed, in no acute distress HEENT: normal Lymph: no adenopathy Neck: no JVD Endocrine:  No thryomegaly Vascular: No carotid bruits; FA pulses 2+ bilaterally without bruits  Cardiac:  normal S1, S2; tachycardic rate, regular rhythm Lungs:  clear to auscultation bilaterally, no wheezing, rhonchi or rales  Abd: soft, nontender, no hepatomegaly  Ext: no edema Musculoskeletal:  Erythematous knees, erythematous right foot, left wrist wrapped Skin: warm and dry  Neuro:  CNs 2-12 intact, no focal abnormalities noted Psych:  Normal affect   EKG:  The EKG was personally reviewed and demonstrates:  Sinus tachycardia, LVH Telemetry:  Telemetry was personally reviewed and demonstrates:  Sinus tach  Relevant CV Studies:  Echocardiogram 11/09/16: Study Conclusions - Left ventricle: The cavity size was mildly dilated. There was   mild focal basal hypertrophy of the septum. Systolic function was   normal. The estimated ejection fraction was in the range of 60%   to 65%. Wall motion was normal; there were no regional wall   motion abnormalities. Left ventricular diastolic function   parameters were normal. - Mitral valve: There was mild regurgitation. - Left atrium: The atrium was mildly dilated. - Right atrium: The atrium was severely dilated. - Tricuspid valve: Mobile density on the atrial side of the   tricuspid valve measuring 0.9 x 0.57cm in diameter and consistent   with vegetation. There was severe regurgitation. - Pulmonary arteries: PA peak pressure: 59 mm Hg (S). - Pericardium, extracardiac: A trivial pericardial effusion was   identified posterior to the heart.  Impressions: - There was a vegetation, consistent with endocarditis. The right   ventricular systolic pressure was increased consistent with   moderate pulmonary hypertension.  Laboratory Data:  Chemistry  Recent Labs Lab 11/12/16 0545 11/14/16 0637 11/15/16 0217  NA 136 133* 132*  K 4.9 4.2 5.0  CL 102  98* 98*  CO2 26 29 24   GLUCOSE 91 112* 118*  BUN 17 12 14   CREATININE 0.67 0.65 0.72  CALCIUM 8.4* 8.2* 7.8*  GFRNONAA >60 >60 >60  GFRAA >60 >60 >60  ANIONGAP 8 6 10      Recent Labs Lab 11/09/16 0257 11/11/16 0250 11/12/16 0545  PROT 5.8* 5.3* 6.3*  ALBUMIN 2.7* 2.1* 2.3*  AST 44* 32 35  ALT 52 45 43  ALKPHOS 210* 217* 199*  BILITOT 1.2 1.1 1.4*   Hematology  Recent Labs Lab 11/13/16 0324 11/14/16 0637 11/15/16 0217  WBC 11.8* 12.8* 12.5*  RBC 3.63* 3.51* 3.30*  HGB  10.1* 9.9* 9.2*  HCT 30.3* 29.2* 27.8*  MCV 83.5 83.2 84.2  MCH 27.8 28.2 27.9  MCHC 33.3 33.9 33.1  RDW 15.3 15.0 14.9  PLT 422* 335 311   Cardiac EnzymesNo results for input(s): TROPONINI in the last 168 hours.   Recent Labs Lab 11/09/16 0311 11/09/16 2244  TROPIPOC 0.00 0.00    BNP  Recent Labs Lab 11/09/16 0258  BNP 139.6*    DDimer No results for input(s): DDIMER in the last 168 hours.  Radiology/Studies:  No results found.  Assessment and Plan:   1. Endocarditis of tricuspid valve, tricuspid regurgitation, pulmonary hypertension, MSSA bacteremia - ID consult for ABX selection - Echocardiogram yesterday with normal LV function and normal diastolic function - EKG without heart block - sinus tachycardia may be related to PNA/septic emboli and may resolve with ABX treatment, will continue to monitor - will obtain TEE to fully evaluate heart valves I had a long discussion with the patient about the importance of treatment for his addiction and the importance of finishing a long course, potentially 6 weeks, of antibiotics.   TEE scheduled for Monday.   2. Pneumonia vs septic emboli - in the setting of known endocarditis - CTA chest with bilateral subsecmental pulmonary emboli associated with cavitating PNA and septic emboli  - ABX per ID   3. Erythematous knees, right wrist - ortho engaged    Signed, Marcelino Duster, [place]  11/15/2016 2:52 PM  As above, patient seen  in examined. Briefly he is a 26 year old male with past medical history of hypertension, heroin abuse for evaluation of endocarditis. Patient complains of 2 months of intermittent left-sided chest pain. Pain increases with movement and inspiration. He also describes a dry cough. Over the past 2 weeks he has noticed fevers and chills. He also developed pain in his right wrist, right knee and right ankle. Recent blood cultures drawn were positive for methicillin sensitive staph aureus. An echocardiogram showed a vegetation on the tricuspid valve. Cardiology is now asked to evaluate. Patient states that recently he is using heroin 5 times daily. Laboratory show blood cultures positive for methicillin sensitive staph aureus. Creatinine 0.59. Hemoglobin 9.9 with white blood cell count 17.6. Chest CTA shows probable septic emboli and small pericardial effusion. Electrocardiogram shows sinus tachycardia, RV conduction delay, left ventricular hypertrophy. PR interval is normal.  1 tricuspid valve endocarditis/septic emboli-I have reviewed his transthoracic echocardiogram personally and he does have a tricuspid valve vegetation with at least moderate tricuspid regurgitation. Blood cultures show methicillin sensitive staph aureus. Agree with antibiotics including vancomycin and aztreonam. Infectious disease is following. Note he has also had pain in his right wrist, right knee and right ankle. Orthopedic surgery is following for possible septic arthritis. I will arrange a transesophageal echocardiogram on Monday to evaluate his aortic valve and mitral valve. There is no indication at present for surgical intervention and we would like to avoid this given history of substance abuse.  2 heroin abuse-follow closely for withdrawal. He will need rehabilitation admission once his endocarditis is treated. I told the patient today that if he continues heroin abuse his life would be at risk.   3 chest pain-likely both  musculoskeletal and related to septic emboli. Would treat with nonsteroidals.  4 history of juvenile rheumatoid arthritis   Marcelino Duster, [place]

## 2016-11-10 NOTE — Plan of Care (Signed)
Problem: Safety: Goal: Ability to remain free from injury will improve Outcome: Progressing Instructed to call for needs and oob. Verbalized understanding.

## 2016-11-10 NOTE — Progress Notes (Signed)
Pharmacy Antibiotic Note Cody Rios is a 26 y.o. male admitted on 11/10/2016 with MSSA bacteremia with TV endocarditis and pulmonary septic emboli. Initially started on vancomycin per ID but to add Azactam per TRH due to concern for HCAP.   Plan: 1. Azactam 2 grams IV every 8 hours  2. Vancomycin 1 gram IV every 8 hours  3. Await formal ID evaluation as pended note states to only continue vancomycin  4. Follow up severity of cephalosporin and penicillin allergy      Temp (24hrs), Avg:98.4 F (36.9 C), Min:97.7 F (36.5 C), Max:99.1 F (37.3 C)   Recent Labs Lab 11/04/16 0428 11/07/16 1824 11/09/16 0257 11/09/16 0313 11/09/16 1030 11/09/16 1356 11/09/16 2214  WBC 9.7 12.9* 16.4*  --   --   --  17.6*  CREATININE 0.85 0.66 0.64  --   --   --  0.59*  LATICACIDVEN  --   --   --  0.88 1.3 2.1*  --     Estimated Creatinine Clearance: 148.7 mL/min (A) (by C-G formula based on SCr of 0.59 mg/dL (L)).    Allergies  Allergen Reactions  . Cephalosporins Other (See Comments)    unknown  . Penicillins Nausea And Vomiting and Other (See Comments)    Childhood allergy     Antimicrobials this admission: 8/9 Azactam >>  8/9 vancomycin >>    Microbiology results: 8/8 BCx: px 8/7: BCx: MSSA: 8/7 synovial: px    Thank you for allowing pharmacy to be a part of this patient's care.  Pollyann SamplesAndy Shondell Fabel, PharmD, BCPS 11/10/2016, 7:46 AM

## 2016-11-10 NOTE — ED Notes (Signed)
Inconstant motion

## 2016-11-10 NOTE — ED Notes (Signed)
Report called  

## 2016-11-10 NOTE — ED Notes (Signed)
The pt repots that he signed himself out from [facility] tonight because they let some fluid infiltrate .  C/o being cold then hot.  hyperventilating

## 2016-11-10 NOTE — Progress Notes (Signed)
      [facility] has been requested to perform a transesophageal echocardiogram on Arville A Ek for endocarditis.  After careful review of history and examination, the risks and benefits of transesophageal echocardiogram have been explained including risks of esophageal damage, perforation (1:10,000 risk), bleeding, pharyngeal hematoma as well as other potential complications associated with conscious sedation including aspiration, arrhythmia, respiratory failure and death. Alternatives to treatment were discussed, questions were answered. Patient is willing to proceed.    Pt scheduled for TEE on Monday, 11/14/16. NPO at [facility] on Sunday night. Orders signed and held.   Roe Rutherfordngela Nicole Lagretta Loseke, [place]  11/10/2016 2:34 PM

## 2016-11-10 NOTE — Progress Notes (Signed)
PHARMACY - PHYSICIAN COMMUNICATION CRITICAL VALUE ALERT - BLOOD CULTURE IDENTIFICATION (BCID)  Results for orders placed or performed during the hospital encounter of 11/07/16  Blood Culture ID Panel (Reflexed) (Collected: 11/08/2016 12:18 AM)  Result Value Ref Range   Enterococcus species NOT DETECTED NOT DETECTED   Listeria monocytogenes NOT DETECTED NOT DETECTED   Staphylococcus species DETECTED (A) NOT DETECTED   Staphylococcus aureus DETECTED (A) NOT DETECTED   Methicillin resistance NOT DETECTED NOT DETECTED   Streptococcus species NOT DETECTED NOT DETECTED   Streptococcus agalactiae NOT DETECTED NOT DETECTED   Streptococcus pneumoniae NOT DETECTED NOT DETECTED   Streptococcus pyogenes NOT DETECTED NOT DETECTED   Acinetobacter baumannii NOT DETECTED NOT DETECTED   Enterobacteriaceae species NOT DETECTED NOT DETECTED   Enterobacter cloacae complex NOT DETECTED NOT DETECTED   Escherichia coli NOT DETECTED NOT DETECTED   Klebsiella oxytoca NOT DETECTED NOT DETECTED   Klebsiella pneumoniae NOT DETECTED NOT DETECTED   Proteus species NOT DETECTED NOT DETECTED   Serratia marcescens NOT DETECTED NOT DETECTED   Haemophilus influenzae NOT DETECTED NOT DETECTED   Neisseria meningitidis NOT DETECTED NOT DETECTED   Pseudomonas aeruginosa NOT DETECTED NOT DETECTED   Candida albicans NOT DETECTED NOT DETECTED   Candida glabrata NOT DETECTED NOT DETECTED   Candida krusei NOT DETECTED NOT DETECTED   Candida parapsilosis NOT DETECTED NOT DETECTED   Candida tropicalis NOT DETECTED NOT DETECTED    Name of physician (or Provider) Contacted:   Dr. Erma HeritageIsaacs  Changes to prescribed antibiotics required:   Pt currently in ED waiting room.  Due to allergies, ordered Vancomycin 1 g IV.  Eddie Candlebbott, Duard Spiewak [place] 11/10/2016  12:27 AM

## 2016-11-10 NOTE — H&P (Signed)
History and Physical    Cody Rios:[id number] DOB: [birth year] DOA: 11/10/2016   PCP: System, [name] [facility]  Attending physician: Konrad Dolores  Patient coming from/Resides with: Private residence  Chief Complaint: Chest pain  HPI: Cody Rios is a 26 y.o. 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 Patient began having intermittent sharp chest pain and shortness of breath about 3 weeks prior. He also reported generalized myalgias. He has been evaluated several times in the ER and was treated for upper respiratory infection possible early pneumonia and given Zithromax. One week ago he presented with fevers and chills and worsening pain in back, shoulders, knees and wrist and continued chest pain. He was evaluated for possible endocarditis. Blood cultures were obtained but were not immediately available but the patient declined to be admitted. He was advised he may have endocarditis and this could lead to death but he continued to decline admission. His blood cultures eventually revealed MSSA and he was asked to return to the [facility] ER for admission. Patient agreed to admission yesterday afternoon. He had subsequently developed right wrist pain with an MRI revealing tenosynovitis. MRI was reviewed by orthopedic physician Dr. Amanda Pea who agreed there was no abscess and this process was likely tenosynovitis and recommended antibiotic therapies. He was started on IV vancomycin.   Unfortunately later in the evening the patient opted to leave AMA noting 3 hours later he returned to the [facility] ER reporting 9/10 chest tightness and shortness of breath. During my discussion with the patient he reports that he left to go use heroin because his pain was uncontrolled and he knew he was withdrawing and the clonidine that he was being given was not working. Of note, a 2 view  chest x-ray performed at [facility] on 8/8 demonstrated new findings of bilateral midlung airspace opacities concerning for multifocal pneumonia. An echocardiogram completed on 8/8 did reveal a mobile density on the atrial side of the tricuspid valve measuring 0.9 x 0.57 cm which is consistent with a vegetation with associated severe regurgitation. There was also pulmonary hypertension of 59 mmHg. Given the change in chest x-ray from 6 days prior there is concern that this pneumonia process may represent septic pulmonary emboli. Patient has agreed to admission. I had a long discussion with the patient regarding treatment of pain as well as treatment of his heroin addiction. I did explain to the patient that we would not be up to completely resolve his pain we would keep it under control. He verbalized understanding. He did explain to me that he had been in a methadone clinic recently but because he was behind on payments to them he states he was no longer eligible to attend the clinic. I discussed with the patient that during the hospitalization he would likely need formal psychiatric evaluation to be eligible for inpatient methadone therapy.   ED Course:  Vital Signs: BP 119/72   Pulse (!) 111   Temp 98.5 F (36.9 C)   Resp 19   SpO2 100%  2 view CXR: As above  MR Right wrist: Limited exam, mild tenosynovitis of the abductor pollicis longus, flexor carpi radialis and extensor carpi ulnaris. Infectious tenosynovitis cannot be excluded on  Lab data: Sodium 133, potassium 4.0, chloride 101, CO2 25, glucose 1:15, BUN 6, creatinine 0.59, calcium 8.0,  poc troponin 0.00, lactic acid yesterday afternoon was 2.1, white  count 17,600 and differential not obtained, hemoglobin 9.9, platelets 241,000  Medications and treatments: Vancomycin 1 g IV 1, normal saline bolus 2 L  Review of Systems:  In addition to the HPI above,  No Headache, changes with Vision or hearing, new weakness, tingling, numbness in any  extremity, dizziness, dysarthria or word finding difficulty, gait disturbance or imbalance, tremors or seizure activity No problems swallowing food or Liquids, indigestion/reflux, choking or coughing while eating, abdominal pain with or after eating No palpitations, orthopnea or DOE No melena,hematochezia, dark tarry stools, constipation No dysuria, malodorous urine, hematuria or flank pain No new skin rashes, lesions, masses or bruises, No new joint pains, aches, swelling or redness No recent unintentional weight gain or loss No polyuria, polydypsia or polyphagia   Past Medical History:  Diagnosis Date  . Hepatitis C     Past Surgical History:  Procedure Laterality Date  . HERNIA REPAIR    . KNEE SURGERY      Social History   Social History  . Marital status: Single    Spouse name: N/A  . Number of children: N/A  . Years of education: N/A   Occupational History  . Not on file.   Social History Main Topics  . Smoking status: Never Smoker  . Smokeless tobacco: Current User    Types: Chew  . Alcohol use No  . Drug use: Yes    Types: IV     Comment: Last used: 22:00   . Sexual activity: Not on file   Other Topics Concern  . Not on file   Social History Narrative  . No narrative on file    Mobility: Independent Work history: Unemployed   Allergies  Allergen Reactions  . Cephalosporins Other (See Comments)    unknown  . Penicillins Nausea And Vomiting and Other (See Comments)    Childhood allergy     Family history reviewed and not pertinent to current clinical findings and admission diagnosis   Prior to Admission medications   Medication Sig Start Date End Date Taking? Authorizing Provider  azithromycin (ZITHROMAX) 250 MG tablet Take first 2 tablets together, then 1 every day until finished. Patient not taking: Reported on 11/09/2016 11/04/16   Linwood Dibbles, MD  ibuprofen (ADVIL,MOTRIN) 800 MG tablet Take 1 tablet (800 mg total) by mouth every 8 (eight) hours  as needed. Patient not taking: Reported on 11/10/2016 11/08/16   Charlestine Night, PA-C  naproxen (NAPROSYN) 500 MG tablet Take 1 tablet (500 mg total) by mouth 2 (two) times daily. Patient not taking: Reported on 11/10/2016 11/04/16   Linwood Dibbles, MD    Physical Exam: Vitals:   11/10/16 0530 11/10/16 0539 11/10/16 0600 11/10/16 0645  BP: 119/90  116/81 119/72  Pulse: (!) 111  (!) 116 (!) 111  Resp: 18  (!) 29 19  Temp:      TempSrc:      SpO2: 97% 100% 99% 100%      Constitutional: NAD, Anxious, uncomfortable secondary to ongoing crampy abdominal pain and generalized myalgias Eyes: PERRL, lids and conjunctivae normal ENMT: Mucous membranes are dry. Posterior pharynx clear of any exudate or lesions. Normal dentition.  Neck: normal, supple, no masses, no thyromegaly Respiratory: clear to auscultation bilaterally, no wheezing, no crackles. Normal respiratory effort. No accessory muscle use.  Cardiovascular: Regular rate and rhythm, no rubs / gallops. No extremity edema. 2+ pedal pulses. No carotid bruits. Two grade 2/6 systolic murmurs: Right sternal border at clavicle/second intercostal space-left sternal border fifth intercostal  space Abdomen: no tenderness, no masses palpated. No hepatosplenomegaly. Bowel sounds positive.  Musculoskeletal: no clubbing / cyanosis. No joint deformity upper and lower extremities. Good ROM, no contractures. Normal muscle tone.  Skin: no rashes, lesions, ulcers. No induration-multiple areas of skin art; several areas of puncture wounds in the brachiocephalic areas left arm consistent with known IV drug abuse Neurologic: CN 2-12 grossly intact. Sensation intact, DTR normal. Strength 5/5 x all 4 extremities.  Psychiatric: Normal judgment and insight. Alert and oriented x 3. Anxious mood.    Labs on Admission: I have personally reviewed following labs and imaging studies  CBC:  Recent Labs Lab 11/04/16 0428 11/07/16 1824 11/09/16 0257 11/09/16 2214  WBC  9.7 12.9* 16.4* 17.6*  NEUTROABS  --   --  12.0*  --   HGB 12.0* 11.1* 9.9* 9.9*  HCT 36.0* 31.4* 27.9* 29.1*  MCV 84.9 82.4 83.0 82.7  PLT 178 193 189 241   Basic Metabolic Panel:  Recent Labs Lab 11/04/16 0428 11/07/16 1824 11/09/16 0257 11/09/16 2214  NA 133* 134* 135 133*  K 3.9 3.7 3.7 4.0  CL 97* 99* 101 101  CO2 27 26 28 25   GLUCOSE 158* 117* 113* 115*  BUN 14 15 10 6   CREATININE 0.85 0.66 0.64 0.59*  CALCIUM 8.4* 8.6* 8.2* 8.0*   GFR: Estimated Creatinine Clearance: 148.7 mL/min (A) (by C-G formula based on SCr of 0.59 mg/dL (L)). Liver Function Tests:  Recent Labs Lab 11/09/16 0257  AST 44*  ALT 52  ALKPHOS 210*  BILITOT 1.2  PROT 5.8*  ALBUMIN 2.7*   No results for input(s): LIPASE, AMYLASE in the last 168 hours. No results for input(s): AMMONIA in the last 168 hours. Coagulation Profile:  Recent Labs Lab 11/09/16 1030  INR 1.27   Cardiac Enzymes: No results for input(s): CKTOTAL, CKMB, CKMBINDEX, TROPONINI in the last 168 hours. BNP (last 3 results) No results for input(s): PROBNP in the last 8760 hours. HbA1C: No results for input(s): HGBA1C in the last 72 hours. CBG: No results for input(s): GLUCAP in the last 168 hours. Lipid Profile: No results for input(s): CHOL, HDL, LDLCALC, TRIG, CHOLHDL, LDLDIRECT in the last 72 hours. Thyroid Function Tests: No results for input(s): TSH, T4TOTAL, FREET4, T3FREE, THYROIDAB in the last 72 hours. Anemia Panel: No results for input(s): VITAMINB12, FOLATE, FERRITIN, TIBC, IRON, RETICCTPCT in the last 72 hours. Urine analysis:    Component Value Date/Time   COLORURINE AMBER (A) 11/09/2016 0545   APPEARANCEUR HAZY (A) 11/09/2016 0545   LABSPEC 1.024 11/09/2016 0545   PHURINE 5.0 11/09/2016 0545   GLUCOSEU NEGATIVE 11/09/2016 0545   HGBUR SMALL (A) 11/09/2016 0545   BILIRUBINUR NEGATIVE 11/09/2016 0545   KETONESUR NEGATIVE 11/09/2016 0545   PROTEINUR 100 (A) 11/09/2016 0545   NITRITE POSITIVE (A)  11/09/2016 0545   LEUKOCYTESUR NEGATIVE 11/09/2016 0545   Sepsis Labs: @LABRCNTIP (procalcitonin:4,lacticidven:4) ) Recent Results (from the past 240 hour(s))  Culture, blood (routine x 2)     Status: Abnormal (Preliminary result)   Collection Time: 11/08/16 12:15 AM  Result Value Ref Range Status   Specimen Description BLOOD RIGHT ARM  Final   Special Requests   Final    BOTTLES DRAWN AEROBIC AND ANAEROBIC Blood Culture adequate volume   Culture  Setup Time   Final    GRAM POSITIVE COCCI IN CLUSTERS IN BOTH AEROBIC AND ANAEROBIC BOTTLES CRITICAL RESULT CALLED TO, READ BACK BY AND VERIFIED WITH: A. HARTLEY RN 11/18/16 1835 BEAMJ Performed at [facility], [address] [place], [place] [address]    Culture STAPHYLOCOCCUS AUREUS (A)  Final   Report Status PENDING  Incomplete  Culture, blood (routine x 2)     Status: Abnormal (Preliminary result)   Collection Time: 11/08/16 12:18 AM  Result Value Ref Range Status   Specimen Description BLOOD BLOOD RIGHT HAND  Final   Special Requests IN PEDIATRIC BOTTLE Blood Culture adequate volume  Final   Culture  Setup Time   Final    GRAM POSITIVE COCCI IN CLUSTERS IN PEDIATRIC BOTTLE CRITICAL VALUE NOTED.  VALUE IS CONSISTENT WITH PREVIOUSLY REPORTED AND CALLED VALUE.    Culture (A)  Final    STAPHYLOCOCCUS AUREUS SUSCEPTIBILITIES TO FOLLOW Performed at [facility], [address] [place], [place] [address]    Report Status PENDING  Incomplete  Blood Culture ID Panel (Reflexed)     Status: Abnormal   Collection Time: 11/08/16 12:18 AM  Result Value Ref Range Status   Enterococcus species NOT DETECTED NOT DETECTED Final   Listeria monocytogenes NOT DETECTED NOT DETECTED Final   Staphylococcus species DETECTED (A) NOT DETECTED Final    Comment: CRITICAL RESULT CALLED TO, READ BACK BY AND VERIFIED WITH: A. HARTLEY RN 11/08/16 1835 BEAMJ    Staphylococcus aureus DETECTED (A) NOT DETECTED Final    Comment: Methicillin  (oxacillin) susceptible Staphylococcus aureus (MSSA). Preferred therapy is anti staphylococcal beta lactam antibiotic (Cefazolin or Nafcillin), unless clinically contraindicated. CRITICAL RESULT CALLED TO, READ BACK BY AND VERIFIED WITH: A. HARTLEY RN 11/08/16 1835 BEAMJ    Methicillin resistance NOT DETECTED NOT DETECTED Final   Streptococcus species NOT DETECTED NOT DETECTED Final   Streptococcus agalactiae NOT DETECTED NOT DETECTED Final   Streptococcus pneumoniae NOT DETECTED NOT DETECTED Final   Streptococcus pyogenes NOT DETECTED NOT DETECTED Final   Acinetobacter baumannii NOT DETECTED NOT DETECTED Final   Enterobacteriaceae species NOT DETECTED NOT DETECTED Final   Enterobacter cloacae complex NOT DETECTED NOT DETECTED Final   Escherichia coli NOT DETECTED NOT DETECTED Final   Klebsiella oxytoca NOT DETECTED NOT DETECTED Final   Klebsiella pneumoniae NOT DETECTED NOT DETECTED Final   Proteus species NOT DETECTED NOT DETECTED Final   Serratia marcescens NOT DETECTED NOT DETECTED Final   Haemophilus influenzae NOT DETECTED NOT DETECTED Final   Neisseria meningitidis NOT DETECTED NOT DETECTED Final   Pseudomonas aeruginosa NOT DETECTED NOT DETECTED Final   Candida albicans NOT DETECTED NOT DETECTED Final   Candida glabrata NOT DETECTED NOT DETECTED Final   Candida krusei NOT DETECTED NOT DETECTED Final   Candida parapsilosis NOT DETECTED NOT DETECTED Final   Candida tropicalis NOT DETECTED NOT DETECTED Final    Comment: Performed at [facility], [address] [place], [place] [address]  Culture, blood (Routine X 2) w Reflex to ID Panel     Status: None (Preliminary result)   Collection Time: 11/09/16  2:58 AM  Result Value Ref Range Status   Specimen Description BLOOD LEFT ANTECUBITAL  Final   Special Requests   Final    BOTTLES DRAWN AEROBIC ONLY Blood Culture adequate volume   Culture  Setup Time   Final    GRAM POSITIVE COCCI IN CLUSTERS AEROBIC BOTTLE  ONLY CRITICAL RESULT CALLED TO, READ BACK BY AND VERIFIED WITH: Oneta RackHARMD M BELL [id number] MLM Performed at [facility], [address] [place], [place] [address]    Culture GRAM POSITIVE COCCI  Final   Report Status PENDING  Incomplete  Body fluid culture     Status: None (Preliminary result)   Collection Time: 11/09/16  4:12 AM  Result Value Ref Range Status   Specimen Description SYNOVIAL  Final   Special Requests NONE  Final   Gram Stain   Final    ABUNDANT WBC PRESENT,BOTH PMN AND MONONUCLEAR NO ORGANISMS SEEN    Culture   Final    INTERPRET RESULTS WITH CAUTION DUE TO LIMITED SPECIMEN VOLUME Performed at [facility], [address], [place], [place] [address]    Report Status PENDING  Incomplete  Culture, blood (Routine X 2) w Reflex to ID Panel     Status: None (Preliminary result)   Collection Time: 11/09/16  7:44 AM  Result Value Ref Range Status   Specimen Description BLOOD RIGHT ARM  Final   Special Requests IN PEDIATRIC BOTTLE Blood Culture adequate volume  Final   Culture  Setup Time   Final    GRAM POSITIVE COCCI IN CLUSTERS IN PEDIATRIC BOTTLE CRITICAL RESULT CALLED TO, READ BACK BY AND VERIFIED WITH: G.ABBOTT, PHARMD 11/10/16 0017 L.CHAMPION Performed at [facility], [address] [place], [place] [address]    Culture GRAM POSITIVE COCCI IN CLUSTERS  Final   Report Status PENDING  Incomplete     Radiological Exams on Admission: Dg Chest 2 View  Result Date: 11/09/2016 CLINICAL DATA:  Acute onset of generalized chest pain and shortness of breath. Bacteremia. Initial encounter. EXAM: CHEST  2 VIEW COMPARISON:  CT of the chest performed 11/04/2016 FINDINGS: New bilateral midlung airspace opacities raise concern for multifocal pneumonia. No pleural effusion or pneumothorax is seen. The heart is normal in size. No acute osseous abnormalities are seen. IMPRESSION: New bilateral midlung airspace opacities raise concern for multifocal pneumonia.  Electronically Signed   By: [name] M.D.   On: 11/09/2016 04:58   Dg Wrist 2 Views Right  Result Date: 11/09/2016 CLINICAL DATA:  Pain and swelling for 3 days. EXAM: RIGHT WRIST - 2 VIEW COMPARISON:  RIGHT hand radiograph July 22, 2010 FINDINGS: There is no evidence of fracture or dislocation. There is no evidence of arthropathy or other focal bone abnormality. Soft tissues are unremarkable. IMPRESSION: Negative. Electronically Signed   By: [name] M.D.   On: 11/09/2016 04:17   Dg Wrist Complete Left  Result Date: 11/09/2016 CLINICAL DATA:  Pain and swelling for 3 days. EXAM: LEFT WRIST - COMPLETE 3+ VIEW COMPARISON:  None. FINDINGS: There is no evidence of fracture or dislocation. There is no evidence of arthropathy or other focal bone abnormality. Soft tissues are unremarkable. IMPRESSION: Negative. Electronically Signed   By: [name] M.D.   On: 11/09/2016 04:18   Mr Wrist Right Wo Contrast  Result Date: 11/09/2016 CLINICAL DATA:  History of IV drug abuse. Right wrist pain, concern for infection. EXAM: MR OF THE RIGHT WRIST WITHOUT CONTRAST TECHNIQUE: Multiplanar, multisequence MR imaging of the right wrist was performed. No intravenous contrast was administered. COMPARISON:  Right wrist x-rays dated November 09, 2016. FINDINGS: Limited and incomplete examination due to patient's inability to tolerate further sequences secondary to pain. Ligaments: Intact scapholunate and lunotriquetral ligaments. Triangular fibrocartilage: Intact TFCC. Tendons: There is trace fluid in the adductor pollicis longus tendon sheath at the level of the distal scaphoid. There is fluid within the flexor carpi the radialis longus and brevis tendon sheaths at the level of Lister's tubercle. Mild thickening of the extensor carpi ulnaris tendon with a small amount  of fluid in its tendon sheath. The flexor tendons are intact. Carpal tunnel/median nerve: Normal carpal tunnel. Normal median nerve. Guyon's  canal: Normal. Joint/cartilage: Small radiocarpal joint effusion. No definite chondral defect. Bones/carpal alignment: No marrow signal abnormality. Normal alignment. No aggressive osseous lesion. Other: No fluid collection or hematoma. IMPRESSION: 1. Limited and incomplete examination due to patient's inability to tolerate further sequences secondary to pain. 2. Mild tenosynovitis of the abductor pollicis longus, flexor carpi the radialis, and extensor carpi ulnaris. Infectious tenosynovitis cannot be excluded on the basis of imaging alone. 3. Small radiocarpal joint effusion, nonspecific. If there is clinical concern for septic arthritis, joint aspiration is recommended. Electronically Signed   By: Obie Dredge M.D.   On: 11/09/2016 11:45   Dg Knee Complete 4 Views Right  Result Date: 11/09/2016 CLINICAL DATA:  Knee swelling for 3 days, no injury. EXAM: RIGHT KNEE - COMPLETE 4+ VIEW COMPARISON:  None. FINDINGS: No evidence of fracture, dislocation, or joint effusion. No evidence of arthropathy or other focal bone abnormality. Mild prepatellar soft tissue swelling. Small suprapatellar joint effusion. IMPRESSION: Soft-tissue swelling and small suprapatellar joint effusion. No acute osseous process or advanced degenerative change for age. Electronically Signed   By: [name] M.D.   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/Pulmonary HTN -Patient presents with chest pain and generalized myalgias with known positive blood cultures consistent with MSSA and recent echocardiogram demonstrating tricuspid valve vegetation -IV vancomycin -ID consulted on 8/8 and given positive blood cultures this will also result in automatic ID reconsult -Cardiology consulted to determine if TEE  indicated -May require CVTS evaluation -Continue telemetry and step down level of care -Monitor for acute respiratory failure/CHF symptoms secondary to valve failure -Discussed with patient risk for embolic stroke related to endocarditis as well as possible need for surgical repair if medical therapy ineffective -IV fluid at 75/hr (physiologic rate)  Active Problems:   Multifocal pneumonia/?? Septic pulmonary embolism  -Patient has new finding of multifocal pneumonia on chest x-ray that was not present on chest x-ray or CT angiogram of chest on 8/3 which is concerning for possible septic emboli in setting of known endocarditis -Obtain CTA of chest ** + for Bilateral subsegmental pulmonary emboli associated with cavitating pneumonias and consistent with septic emboli. -Azactam IV (PCN allergic) -Currently not hypoxemic but does have leukocytosis -Lactic acid mildly elevated yesterday with repeat pending -Continuous pulse oximetry and have oxygen available prn    Heroin abuse/IV drug abuse -Patient previously enrolled in methadone clinic; because he owed the clinic money has been unable to return for additional treatments/medication  -Continue clonidine and opiate withdrawal protocol -Very heavy user of heroin therefore in setting of acute pain and acute withdrawal symptoms will allow for combination oral and IV narcotics with desire to transition to methadone later in the hospitalization -Given that methadone will be used to treat heroin addiction/withdrawal he will need a formal psychiatric consultation before initiation -Currently patient experiencing abdominal cramping, nausea and generalized myalgias consistent with heroin withdrawal; patient states he used last night prior to returning to ER -ID documented pt recently caught injecting IVD in his room- may require sitter    Abnormal urinalysis -Urinalysis from 8/7 appears consistent with a UTI -Obtain urine culture -Continue antibiotics  as above    Tenosynovitis of right wrist/Effusion  of right knee -Tenosynovitis likely infectious in etiology confirmed on MRI on 8/8 -EDP on 8/8 also performed arthrocentesis of right knee -Antibiotics as above -Scheduled Toradol IV every 6 hours plus daily Protonix -Mobilize    History of juvenile rheumatoid arthritis -Reported to admitting physician at [facility] on 8/8 that he had been taking sulfasalazine for 2-3 years but stopped because he didn't think it was helping    Anemia -Likely related to malnutrition in setting of ongoing IV drug abuse -Anemia panel -TSH -Follow labs      DVT prophylaxis: Lovenox Code Status: Full Family Communication: No family or friends at bedside Disposition Plan: Home Consults called: ID/[facility] (8/8); Cardiology/[facility] on-call MD     Russella Dar ANP-BC [facility] Pager [phone]   If 7PM-7AM, please contact night-coverage [email or web address] Password [email or web address]  11/10/2016, 8:13 AM

## 2016-11-11 DIAGNOSIS — M86071 Acute hematogenous osteomyelitis, right ankle and foot: Secondary | ICD-10-CM

## 2016-11-11 DIAGNOSIS — M25461 Effusion, right knee: Secondary | ICD-10-CM

## 2016-11-11 LAB — CBC
HEMATOCRIT: 25.9 % — AB (ref 39.0–52.0)
HEMOGLOBIN: 9 g/dL — AB (ref 13.0–17.0)
MCH: 28 pg (ref 26.0–34.0)
MCHC: 34.7 g/dL (ref 30.0–36.0)
MCV: 80.7 fL (ref 78.0–100.0)
Platelets: 310 10*3/uL (ref 150–400)
RBC: 3.21 MIL/uL — ABNORMAL LOW (ref 4.22–5.81)
RDW: 14.5 % (ref 11.5–15.5)
WBC: 17.8 10*3/uL — ABNORMAL HIGH (ref 4.0–10.5)

## 2016-11-11 LAB — COMPREHENSIVE METABOLIC PANEL
ALBUMIN: 2.1 g/dL — AB (ref 3.5–5.0)
ALT: 45 U/L (ref 17–63)
ANION GAP: 8 (ref 5–15)
AST: 32 U/L (ref 15–41)
Alkaline Phosphatase: 217 U/L — ABNORMAL HIGH (ref 38–126)
BILIRUBIN TOTAL: 1.1 mg/dL (ref 0.3–1.2)
BUN: 8 mg/dL (ref 6–20)
CHLORIDE: 102 mmol/L (ref 101–111)
CO2: 24 mmol/L (ref 22–32)
Calcium: 7.8 mg/dL — ABNORMAL LOW (ref 8.9–10.3)
Creatinine, Ser: 0.63 mg/dL (ref 0.61–1.24)
GFR calc Af Amer: >60 mL/min (ref >60)
GFR calc non Af Amer: >60 mL/min (ref >60)
GLUCOSE: 137 mg/dL — AB (ref 65–99)
POTASSIUM: 3.4 mmol/L — AB (ref 3.5–5.1)
SODIUM: 134 mmol/L — AB (ref 135–145)
TOTAL PROTEIN: 5.3 g/dL — AB (ref 6.5–8.1)

## 2016-11-11 LAB — URINE CULTURE

## 2016-11-11 LAB — CULTURE, BLOOD (ROUTINE X 2): Special Requests: ADEQUATE

## 2016-11-11 MED ORDER — BUPRENORPHINE HCL-NALOXONE HCL 8-2 MG SL SUBL
1.0000 | SUBLINGUAL_TABLET | Freq: Two times a day (BID) | SUBLINGUAL | Status: DC
  Administered 2016-11-12 – 2016-11-17 (×11): 1 via SUBLINGUAL
  Filled 2016-11-11 (×11): qty 1

## 2016-11-11 MED ORDER — BUPRENORPHINE HCL-NALOXONE HCL 8-2 MG SL SUBL
1.0000 | SUBLINGUAL_TABLET | Freq: Two times a day (BID) | SUBLINGUAL | Status: DC
  Administered 2016-11-11: 1 via SUBLINGUAL
  Filled 2016-11-11: qty 1

## 2016-11-11 NOTE — Progress Notes (Signed)
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 MRI of right ankle and right foot showed presence of osteomyelitis in the proximal 3rd metatarsal shaft.  The patient's right knee is also possibly septic and necessitates I& D.  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   -consult orthopedic surgery to evaluate the right 3rd digit -continue vancomycin  -repeat blood culture to check for growth -counsel regarding heroin and other possible substance abuse -Get TEE to further assess for intervention   -Do not place any lines till no growth for at least 4 days  Tenosynovitis of right wrist  -Consider possible I&D -Continue vancomycin  IVDU The patient was having withdrawal symptoms this morning (8/10) -Started on 8-2mg  bid buprenorphine-naloxone  -Continue clonidine and bentyl    . [START ON 11/12/2016] buprenorphine-naloxone  1 tablet Sublingual BID  . cloNIDine  0.1 mg Oral QID   Followed by  . [START ON 11/12/2016] cloNIDine  0.1 mg Oral BH-qamhs   Followed by  . [START ON 11/15/2016] cloNIDine  0.1 mg Oral QAC breakfast  . enoxaparin (LOVENOX) injection  40 mg Subcutaneous Q24H  . ketorolac  15 mg Intravenous Q6H  . pantoprazole  40 mg Oral Daily  . sodium chloride flush  3 mL Intravenous Q12H    SUBJECTIVE: Cody Rios was seen sitting on the side of his bed appearing distressed. He stated that he felt diaphoretic, had chest pain, and sob.   Review of  Systems: ROS as above  Allergies  Allergen Reactions  . Cephalosporins Hives  . Penicillins Hives, Shortness Of Breath and Rash    OBJECTIVE: Vitals:   11/11/16 0456 11/11/16 0741 11/11/16 1132 11/11/16 2051  BP: 122/69 126/82  103/83  Pulse: (!) 104 (!) 106 (!) 105 (!) 102  Resp: 19 16 15 18   Temp: 98 F (36.7 C)  98.4 F (36.9 C)   TempSrc: Oral Oral Oral   SpO2: 98% 99% 99% 96%  Weight: 162 lb 11.2 oz (73.8 kg)     Height:       Body mass index is 20.89 kg/m.  Physical Exam  Constitutional: He appears distressed.  HENT:  Head: Normocephalic and atraumatic.  Cardiovascular: Normal rate and regular rhythm.   Murmur (systolic murmur) heard. Pulmonary/Chest: Effort normal and breath sounds normal. No respiratory distress.  Musculoskeletal:       Right knee: He exhibits swelling and effusion.       Right ankle: Tenderness.  Skin: No rash noted. No erythema.  Psychiatric: Mood, memory, affect and judgment normal.    Lab Results Lab Results  Component Value Date   WBC 17.8 (H) 11/11/2016   HGB 9.0 (L) 11/11/2016   HCT 25.9 (L) 11/11/2016   MCV 80.7 11/11/2016   PLT 310 11/11/2016    Lab Results  Component Value Date   CREATININE 0.63 11/11/2016   BUN 8 11/11/2016   NA 134 (L) 11/11/2016  K 3.4 (L) 11/11/2016   CL 102 11/11/2016   CO2 24 11/11/2016    Lab Results  Component Value Date   ALT 45 11/11/2016   AST 32 11/11/2016   ALKPHOS 217 (H) 11/11/2016   BILITOT 1.1 11/11/2016     Microbiology: Recent Results (from the past 240 hour(s))  Culture, blood (routine x 2)     Status: Abnormal   Collection Time: 11/08/16 12:15 AM  Result Value Ref Range Status   Specimen Description BLOOD RIGHT ARM  Final   Special Requests   Final    BOTTLES DRAWN AEROBIC AND ANAEROBIC Blood Culture adequate volume   Culture  Setup Time   Final    GRAM POSITIVE COCCI IN CLUSTERS IN BOTH AEROBIC AND ANAEROBIC BOTTLES CRITICAL RESULT CALLED TO, READ BACK BY AND  VERIFIED WITH: A. HARTLEY RN 11/18/16 1835 BEAMJ    Culture (A)  Final    STAPHYLOCOCCUS AUREUS SUSCEPTIBILITIES PERFORMED ON PREVIOUS CULTURE WITHIN THE LAST 5 DAYS. Performed at [facility], [address], [place], [place] [address]    Report Status 11/10/2016 FINAL  Final  Culture, blood (routine x 2)     Status: Abnormal   Collection Time: 11/08/16 12:18 AM  Result Value Ref Range Status   Specimen Description BLOOD BLOOD RIGHT HAND  Final   Special Requests IN PEDIATRIC BOTTLE Blood Culture adequate volume  Final   Culture  Setup Time   Final    GRAM POSITIVE COCCI IN CLUSTERS IN PEDIATRIC BOTTLE CRITICAL VALUE NOTED.  VALUE IS CONSISTENT WITH PREVIOUSLY REPORTED AND CALLED VALUE. Performed at [facility], [address] [place], [place] [address]    Culture STAPHYLOCOCCUS AUREUS (A)  Final   Report Status 11/10/2016 FINAL  Final   Organism ID, Bacteria STAPHYLOCOCCUS AUREUS  Final      Susceptibility   Staphylococcus aureus - MIC*    CIPROFLOXACIN <=0.5 SENSITIVE Sensitive     ERYTHROMYCIN >=8 RESISTANT Resistant     GENTAMICIN <=0.5 SENSITIVE Sensitive     OXACILLIN 0.5 SENSITIVE Sensitive     TETRACYCLINE <=1 SENSITIVE Sensitive     VANCOMYCIN <=0.5 SENSITIVE Sensitive     TRIMETH/SULFA <=10 SENSITIVE Sensitive     CLINDAMYCIN RESISTANT Resistant     RIFAMPIN <=0.5 SENSITIVE Sensitive     Inducible Clindamycin POSITIVE Resistant     * STAPHYLOCOCCUS AUREUS  Blood Culture ID Panel (Reflexed)     Status: Abnormal   Collection Time: 11/08/16 12:18 AM  Result Value Ref Range Status   Enterococcus species NOT DETECTED NOT DETECTED Final   Listeria monocytogenes NOT DETECTED NOT DETECTED Final   Staphylococcus species DETECTED (A) NOT DETECTED Final    Comment: CRITICAL RESULT CALLED TO, READ BACK BY AND VERIFIED WITH: A. HARTLEY RN 11/08/16 1835 BEAMJ    Staphylococcus aureus DETECTED (A) NOT DETECTED Final    Comment: Methicillin (oxacillin)  susceptible Staphylococcus aureus (MSSA). Preferred therapy is anti staphylococcal beta lactam antibiotic (Cefazolin or Nafcillin), unless clinically contraindicated. CRITICAL RESULT CALLED TO, READ BACK BY AND VERIFIED WITH: A. HARTLEY RN 11/08/16 1835 BEAMJ    Methicillin resistance NOT DETECTED NOT DETECTED Final   Streptococcus species NOT DETECTED NOT DETECTED Final   Streptococcus agalactiae NOT DETECTED NOT DETECTED Final   Streptococcus pneumoniae NOT DETECTED NOT DETECTED Final   Streptococcus pyogenes NOT DETECTED NOT DETECTED Final   Acinetobacter baumannii NOT DETECTED NOT DETECTED Final   Enterobacteriaceae species NOT DETECTED NOT DETECTED Final   Enterobacter  cloacae complex NOT DETECTED NOT DETECTED Final   Escherichia coli NOT DETECTED NOT DETECTED Final   Klebsiella oxytoca NOT DETECTED NOT DETECTED Final   Klebsiella pneumoniae NOT DETECTED NOT DETECTED Final   Proteus species NOT DETECTED NOT DETECTED Final   Serratia marcescens NOT DETECTED NOT DETECTED Final   Haemophilus influenzae NOT DETECTED NOT DETECTED Final   Neisseria meningitidis NOT DETECTED NOT DETECTED Final   Pseudomonas aeruginosa NOT DETECTED NOT DETECTED Final   Candida albicans NOT DETECTED NOT DETECTED Final   Candida glabrata NOT DETECTED NOT DETECTED Final   Candida krusei NOT DETECTED NOT DETECTED Final   Candida parapsilosis NOT DETECTED NOT DETECTED Final   Candida tropicalis NOT DETECTED NOT DETECTED Final    Comment: Performed at [facility], [address], [place], [place] [address]  Culture, blood (Routine X 2) w Reflex to ID Panel     Status: Abnormal   Collection Time: 11/09/16  2:58 AM  Result Value Ref Range Status   Specimen Description BLOOD LEFT ANTECUBITAL  Final   Special Requests   Final    BOTTLES DRAWN AEROBIC ONLY Blood Culture adequate volume   Culture  Setup Time   Final    GRAM POSITIVE COCCI IN CLUSTERS AEROBIC BOTTLE ONLY CRITICAL RESULT CALLED TO, READ BACK  BY AND VERIFIED WITH: PHARMD M BELL [id number] MLM    Culture (A)  Final    STAPHYLOCOCCUS AUREUS SUSCEPTIBILITIES PERFORMED ON PREVIOUS CULTURE WITHIN THE LAST 5 DAYS. Performed at [facility], [address], [place], [place] [address]    Report Status 11/10/2016 FINAL  Final  Body fluid culture     Status: None (Preliminary result)   Collection Time: 11/09/16  4:12 AM  Result Value Ref Range Status   Specimen Description SYNOVIAL  Final   Special Requests NONE  Final   Gram Stain   Final    ABUNDANT WBC PRESENT,BOTH PMN AND MONONUCLEAR NO ORGANISMS SEEN    Culture   Final    NO GROWTH 2 DAYS INTERPRET RESULTS WITH CAUTION DUE TO LIMITED SPECIMEN VOLUME Performed at [facility], [address], [place], [place] [address]    Report Status PENDING  Incomplete  Culture, blood (Routine X 2) w Reflex to ID Panel     Status: Abnormal   Collection Time: 11/09/16  7:44 AM  Result Value Ref Range Status   Specimen Description BLOOD RIGHT ARM  Final   Special Requests IN PEDIATRIC BOTTLE Blood Culture adequate volume  Final   Culture  Setup Time   Final    GRAM POSITIVE COCCI IN CLUSTERS IN PEDIATRIC BOTTLE CRITICAL RESULT CALLED TO, READ BACK BY AND VERIFIED WITH: G.ABBOTT, PHARMD 11/10/16 0017 L.CHAMPION    Culture (A)  Final    STAPHYLOCOCCUS AUREUS SUSCEPTIBILITIES PERFORMED ON PREVIOUS CULTURE WITHIN THE LAST 5 DAYS. Performed at [facility], [address], [place], [place] [address]    Report Status 11/11/2016 FINAL  Final  Urine Culture     Status: Abnormal   Collection Time: 11/10/16 10:52 AM  Result Value Ref Range Status   Specimen Description URINE, RANDOM  Final   Special Requests NONE  Final   Culture <10,000 COLONIES/mL INSIGNIFICANT GROWTH (A)  Final   Report Status 11/11/2016 FINAL  Final    Lorenso Courier, MD [facility] [phone] pager   [phone] cell 11/11/2016, 6:17 PM

## 2016-11-11 NOTE — Progress Notes (Addendum)
      INFECTIOUS DISEASE ATTENDING ADDENDUM:   Date: 11/11/2016  Patient name: Cody Rios  Medical record number: [id number]  Date of birth: 04-Mar-1991    This patient has been seen and discussed with the house staff. Please see the resident's note for complete details. I concur with their findings with the following additions/corrections:  Patient's MRI of his ankle showed evidence of osteomyelitis in the third digit of his toe where he is quite tender on exam though is also tender in his first digit. I worried not just about osteomyelitis in his toe but also potential septic arthritis of his knee.  I will repeat his blood cultures today as he is still not shown evidence of clearance of his bacteremia.  He does need to be seen by orthopedic surgery again for evaluation of surgical intervention on his toe. Also his hand will need to be closely followed as will his knee.  Greatly appreciate cardiology following and he will get a TEE on Monday.  Greatly appreciate Dr. Oswaldo DoneVincent seeing him to help with opiate withdrawal and treatment of his IVDU disorder.  Cody Rios 11/11/2016, 4:52 PM

## 2016-11-11 NOTE — Consult Note (Signed)
Date: 11/11/2016  Patient name: Cody Rios  Medical record number: [id number]  Date of birth: 02/01/1991   I was asked to see Cody Rios by Dr. Tommy Medal for consideration of medication assisted therapy for severe opioid use disorder. Patient was admitted to the hospital on 8/9 for MSSA bacteremia with high suspicion for right-sided endocarditis. When I met with the patient this morning he reported feeling okay, however he is experiencing mild withdrawal type symptoms. Currently he denies having any acute pain generators, he reports that his knee pain is improved in his wrist pain is only mild. More troublesome for him now is that he's feeling agitated, diaphoretic, mild nausea, restless, all consistent with opioid withdrawal. He last received a 5 mg dose of oxycodone yesterday at noon, and has had 4 mg of morphine so far today last at 9 AM. He reports greatly fearing going into withdrawal, and says that this was what led to him leaving the emergency departments against medical advise just prior to this admission. He is interested in medication assisted therapy to treat his current withdrawal symptoms. He has a history of being very successful with methadone, he was on treatments through [facility] from 2013-2017. At that time he had a relapse and for the last 1 year he has been a daily heroin user, uses about 4-5 times per day, about 1.5 g of heroin daily. He's never had an admission for IV drug complication before, has never had an overdose requiring intervention.  Patient is otherwise healthy with no medical problems. He lives in [place] with his parents. His parents are aware of his opioid use disorder. The patient works [employer]il [employer] with his father, but this has been on and off due to his addiction. He is uninsured. He paid for his methadone therapy in the past out of pocket, cost about $14 per day, currently he has an outstanding balance at [facility] but hopes  to be able to pay it off to reinitiate treatment.   On exam he is currently afebrile, heart rate 106, blood pressure 126/82, respirations 14 oxygen saturation 99%. He is anxious appearing lying in bed, not in distress. His heart is regular rate and rhythm with a 2/6 holosystolic murmur at the left lower sternal border. His lungs are clear throughout. His abdomen is soft and nontender. His extremities are warm well perfused. His right knee still little swollen, but no erythema and no pain with range of motion. His get on his left upper extremity. Neuro exam is conversational and has full strength throughout. On psych exam he has a mildly blunted affect, but otherwise appropriate.  Assessment: 26 year old man with severe opioid use disorder now in mild withdrawal admitted for this is a bacteremia and likely endocarditis.  Plan:  For the severe opioid use disorder with mild withdrawal I think he would be a good candidate for starting medication assisted therapy in the hospital. I find that this can greatly help these patients engage in their care and they are much less likely to leave against medical advice prior to finishing recommended therapy. Clinical opioid withdrawal scale score is 13, consistent with early moderate withdrawal. I spoke with his attending hospitalist provider, Dr. Chauncy Passy, who agrees with trialing buprenorphine to treat the withdrawal. - DC morphine and oxycodone, these can be used with buprenorphine if needed for acute pain, but currently this patient does not have an acute pain generator so I doubt he will need short acting opioids.  - Start Suboxone 8-14m  bid Sublingual. Can increase dose to TID if needed for withdrawal symptoms.  - Continue with clonidine and bentyl for withdrawal symptoms for now, but won't need them once stabilized.   I will keep the patient on my list and see him next week. Please call if you have any questions. I will also help arrange for follow up once he is  ready for discharge, either with [place] in the [facility] ([phone]) or he can follow up with [facility] if he prefers.   Axel Filler, MD 11/11/2016, 11:17 AM  [phone]

## 2016-11-11 NOTE — Progress Notes (Signed)
  Date: 11/11/2016  Patient name: Cody Rios  Medical record number: [id number]  Date of birth: 01/15/1991   I checked on Cody Rios 2 hours after his induction dose of Buprenorphine. I chose to give him 8mg  for his induction given his significant withdrawal and high opioid dependence. He is looking well now, his withdrawal symptoms have essentially resolved. I would plan to hold for the rest of the day, tomorrow we can give Buprenorphine 8mg  BID. I think a good target dose for him will be 16mg  daily. If he has more withdrawal symptoms recur tonight, he can receive an additional 4mg  Buprenorphine dose if needed.  Cody Rios, Cody Furber Thomas, MD 11/11/2016, 2:24 PM

## 2016-11-11 NOTE — Progress Notes (Signed)
Progress Note  Patient Name: Cody Rios Date of Encounter: 11/11/2016  Primary Cardiologist: Dr Jens Som  Subjective   Chest pain with inspiration; no dyspnea; complains of wrist and ankle pain  Inpatient Medications    Scheduled Meds: . cloNIDine  0.1 mg Oral QID   Followed by  . [START ON 11/12/2016] cloNIDine  0.1 mg Oral BH-qamhs   Followed by  . [START ON 11/15/2016] cloNIDine  0.1 mg Oral QAC breakfast  . enoxaparin (LOVENOX) injection  40 mg Subcutaneous Q24H  . ketorolac  15 mg Intravenous Q6H  . pantoprazole  40 mg Oral Daily  . sodium chloride flush  3 mL Intravenous Q12H   Continuous Infusions: . sodium chloride 75 mL/hr at 11/10/16 2315  . sodium chloride    . vancomycin 1,000 mg (11/11/16 0540)   PRN Meds: acetaminophen **OR** acetaminophen, dicyclomine, hydrOXYzine, loperamide, methocarbamol, morphine injection, ondansetron **OR** ondansetron (ZOFRAN) IV, oxyCODONE   Vital Signs    Vitals:   11/10/16 2007 11/11/16 0032 11/11/16 0456 11/11/16 0741  BP: 126/73 125/75 122/69 126/82  Pulse: (!) 115 (!) 114 (!) 104 (!) 106  Resp: (!) 30  19 16   Temp: (!) 100.5 F (38.1 C) 99.5 F (37.5 C) 98 F (36.7 C)   TempSrc: Oral Oral Oral Oral  SpO2: 100% 98% 98% 99%  Weight:   73.8 kg (162 lb 11.2 oz)   Height:        Intake/Output Summary (Last 24 hours) at 11/11/16 0945 Last data filed at 11/11/16 0904  Gross per 24 hour  Intake          1226.25 ml  Output             1375 ml  Net          -148.75 ml   Filed Weights   11/10/16 1749 11/11/16 0456  Weight: 75.6 kg (166 lb 11.2 oz) 73.8 kg (162 lb 11.2 oz)    Telemetry    Sinus to sinus tachycardia- Personally Reviewed   Physical Exam   GEN: No acute distress.   Neck: No JVD Cardiac: RRR, no murmurs, rubs, or gallops.  Respiratory: Clear to auscultation bilaterally. GI: Soft, nontender, non-distended  MS: No edema; tender over right knee and ankle; right wrist wrapped Neuro:  Nonfocal   Psych: Normal affect   Labs    Chemistry Recent Labs Lab 11/09/16 0257 11/09/16 2214 11/11/16 0250  NA 135 133* 134*  K 3.7 4.0 3.4*  CL 101 101 102  CO2 28 25 24   GLUCOSE 113* 115* 137*  BUN 10 6 8   CREATININE 0.64 0.59* 0.63  CALCIUM 8.2* 8.0* 7.8*  PROT 5.8*  --  5.3*  ALBUMIN 2.7*  --  2.1*  AST 44*  --  32  ALT 52  --  45  ALKPHOS 210*  --  217*  BILITOT 1.2  --  1.1  GFRNONAA >60 >60 >60  GFRAA >60 >60 >60  ANIONGAP 6 7 8      Hematology Recent Labs Lab 11/09/16 0257 11/09/16 2214 11/10/16 1216 11/11/16 0250  WBC 16.4* 17.6*  --  17.8*  RBC 3.36* 3.52* 3.95* 3.21*  HGB 9.9* 9.9*  --  9.0*  HCT 27.9* 29.1*  --  25.9*  MCV 83.0 82.7  --  80.7  MCH 29.5 28.1  --  28.0  MCHC 35.5 34.0  --  34.7  RDW 15.3 15.2  --  14.5  PLT 189 241  --  310  Recent Labs Lab 11/07/16 1835 11/09/16 0311 11/09/16 2244  TROPIPOC 0.00 0.00 0.00     BNP Recent Labs Lab 11/09/16 0258  BNP 139.6*     Radiology    Ct Angio Chest Pe W Or Wo Contrast  Result Date: 11/10/2016 CLINICAL DATA:  Septic arterial embolism. Tricuspid valve endocarditis. EXAM: CT ANGIOGRAPHY CHEST WITH CONTRAST TECHNIQUE: Multidetector CT imaging of the chest was performed using the standard protocol during bolus administration of intravenous contrast. Multiplanar CT image reconstructions and MIPs were obtained to evaluate the vascular anatomy. CONTRAST:  100 cc Isovue 370 intravenous COMPARISON:  11/04/2016 FINDINGS: Cardiovascular: Normal heart size. There is a small anterior pericardial effusion without visible serosal nodularity or thickening. The tricuspid valve is not characterized due to motion. Suboptimal contrast bolus. Injection rate was decreased due to IV access. There are at least 3 areas of subsegmental pulmonary artery thrombosis, including right upper lobe 7:152, right lower lobe 7:194, and lingula 7:167. Mediastinum/Nodes: Mild enlargement of mediastinal lymph nodes considered  reactive in this setting. Lungs/Pleura: Numerous ground-glass and consolidative nodules randomly distributed in the bilateral lungs, some with cavitation, consistent with septic emboli history. There is no pleural effusion or pneumothorax. No pulmonary edema. Upper Abdomen: No acute finding. Musculoskeletal: No acute finding.  No signs of osseous infection. Critical Value/emergent results were called by telephone at the time of interpretation on 11/10/2016 at 9:07 am to Dr. Konrad Dolores , who verbally acknowledged these results. Review of the MIP images confirms the above findings. IMPRESSION: 1. Bilateral subsegmental pulmonary emboli associated with cavitating pneumonias and consistent with history of septic emboli. No effusion or air leak. 2. Small anterior pericardial effusion. Electronically Signed   By: Marnee Spring M.D.   On: 11/10/2016 09:07   Mr Wrist Right Wo Contrast  Result Date: 11/09/2016 CLINICAL DATA:  History of IV drug abuse. Right wrist pain, concern for infection. EXAM: MR OF THE RIGHT WRIST WITHOUT CONTRAST TECHNIQUE: Multiplanar, multisequence MR imaging of the right wrist was performed. No intravenous contrast was administered. COMPARISON:  Right wrist x-rays dated November 09, 2016. FINDINGS: Limited and incomplete examination due to patient's inability to tolerate further sequences secondary to pain. Ligaments: Intact scapholunate and lunotriquetral ligaments. Triangular fibrocartilage: Intact TFCC. Tendons: There is trace fluid in the adductor pollicis longus tendon sheath at the level of the distal scaphoid. There is fluid within the flexor carpi the radialis longus and brevis tendon sheaths at the level of Lister's tubercle. Mild thickening of the extensor carpi ulnaris tendon with a small amount of fluid in its tendon sheath. The flexor tendons are intact. Carpal tunnel/median nerve: Normal carpal tunnel. Normal median nerve. Guyon's canal: Normal. Joint/cartilage: Small radiocarpal joint  effusion. No definite chondral defect. Bones/carpal alignment: No marrow signal abnormality. Normal alignment. No aggressive osseous lesion. Other: No fluid collection or hematoma. IMPRESSION: 1. Limited and incomplete examination due to patient's inability to tolerate further sequences secondary to pain. 2. Mild tenosynovitis of the abductor pollicis longus, flexor carpi the radialis, and extensor carpi ulnaris. Infectious tenosynovitis cannot be excluded on the basis of imaging alone. 3. Small radiocarpal joint effusion, nonspecific. If there is clinical concern for septic arthritis, joint aspiration is recommended. Electronically Signed   By: Obie Dredge M.D.   On: 11/09/2016 11:45   Mr Foot Right Wo Contrast  Result Date: 11/10/2016 CLINICAL DATA:  Methicillin sensitive Staph aureus infection in blood cultures from August 7th. Patient presents with right foot pain and anterior right foot swelling. History of IV  drug abuse with opiate addiction. EXAM: MRI OF THE RIGHT FOREFOOT WITHOUT CONTRAST TECHNIQUE: Multiplanar, multisequence MR imaging of the right foot was performed. No intravenous contrast was administered. COMPARISON:  None. FINDINGS: Bones/Joint/Cartilage Marrow signal abnormality involving the proximal half of the right third metatarsal shaft from proximal metaphysis through mid diaphysis noted with subtle cortical irregularity along the plantar and lateral aspect of the metatarsal concerning for changes of acute osteomyelitis. Ligaments Negative Muscles and Tendons No evidence of intramuscular abscess or myositis. Extensor and flexor tendons crossing the foot are of normal signal intensity morphology. Soft tissues Generalized soft tissue edema overlying the dorsum of the mid and forefoot without focal abscess. IMPRESSION: Marrow signal abnormalities involving the right third metatarsal shaft from proximal metaphysis to mid diaphysis. This in conjunction with mild cortical irregularity along the  lateral aspect raise concern for changes of osteomyelitis. Electronically Signed   By: Tollie Ethavid  Kwon M.D.   On: 11/10/2016 23:21   Mr Ankle Right Wo Contrast  Result Date: 11/10/2016 CLINICAL DATA:  Is methicillin sensitive Staph aureus in blood cultures. Midfoot pain. EXAM: MRI OF THE RIGHT ANKLE WITHOUT CONTRAST TECHNIQUE: Multiplanar, multisequence MR imaging of the ankle was performed. No intravenous contrast was administered. COMPARISON:  None. FINDINGS: Patient refused IV contrast limiting assessment for osteomyelitis. TENDONS Peroneal: Intact peroneus longus and peroneus brevis tendons. Posteromedial: Intact tibialis posterior, flexor hallucis longus and flexor digitorum longus tendons. Anterior: Intact tibialis anterior, extensor hallucis longus and extensor digitorum longus tendons. Achilles: Intact. Plantar Fascia: Intact. LIGAMENTS Lateral: Intact. Medial: Intact. CARTILAGE Ankle Joint: Small ankle joint effusion.  No chondral defects. Subtalar Joints/Sinus Tarsi: Trace fluid in the subtalar joint also noted. Bones: Marrow signal abnormality involving the third metatarsal shaft from proximal metaphysis through the mid diaphysis. This in conjunction with history of positive blood cultures for staph and overlying soft tissue swelling, findings raise concern for osteomyelitis. Other: None IMPRESSION: Findings suspicious for osteomyelitis of the proximal third metatarsal shaft. Trace tibiotalar and subtalar joint fluid.  No abscess identified. Electronically Signed   By: Tollie Ethavid  Kwon M.D.   On: 11/10/2016 23:25    Patient Profile     26 y.o. 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 Blood cultures show methicillin sensitive staph aureus. Chest CT shows probable septic emboli and small pericardial effusion. Recent transthoracic echocardiogram showed normal LV function, tricuspid valve vegetation and at least moderate mitral regurgitation.  Assessment  & Plan    1 tricuspid valve endocarditis/septic emboli-Transthoracic echocardiogram shows tricuspid valve vegetation with at least moderate tricuspid regurgitation. Blood cultures show methicillin sensitive staph aureus. Continue antibiotics. Infectious disease is following. Note he has also had pain in his right wrist, right knee and right ankle. MRI of ankle suggestive of osteomyelitis. Orthopedic surgery is following. I will arrange a transesophageal echocardiogram on Monday to evaluate his aortic valve and mitral valve. There is no indication at present for surgical intervention and we would like to avoid this given history of substance abuse.  2 heroin abuse-follow closely for withdrawal. Once patient recovers from endocarditis he needs admission for drug rehabilitation.  3 chest pain-likely both musculoskeletal and related to septic emboli. Would treat with nonsteroidals.  4 history of juvenile rheumatoid arthritis   We will see again on Monday. Please call over the weekend with questions.  Signed, Olga MillersBrian Crenshaw, MD  11/11/2016, 9:45 AM

## 2016-11-11 NOTE — Progress Notes (Signed)
[facility]  PROGRESS NOTE  Cody Rios:[id number] DOB: [birth year] DOA: 11/10/2016 PCP: System, [name]   Brief HPI:   26 y.o. male who presents with IVDA w/ known MSSA bacteremia, endocarditis w/ multifocal pneumonia and septic pulmonary emboli. Of note pt left [facility] hospital the day prior AMA. Patient was called at home for MSSA  bacteremia and he came back to hospital.   Subjective   Patient complains of wrist and ankle pain, MRI of the ankle shows osteomyelitis.    Assessment/Plan:     1. Tricuspid valve endocarditis/septic pulmonary emboli- CT chest shows bilateral multifocal pneumonia likely septic emboli from right side endocarditis. TTE shows vegetation on tricuspid valve with significant tricuspid regurgitation and high PA pressure. Blood cultures x 2  positive for MSSA. Patient is currently on vancomycin. ID and cartilage are following. Plan for TEE on Monday. 2. MSSA bacteremia- blood cultures on 11/09/2016 showed MSSA bacteremia in 2 out of 2 bottles. Patient currently on vancomycin per pharmacy. ID following. 3. Right ankle osteomyelitis- MRI of the right ankle shows osteomyelitis, continue antibiotics as above. 4. Left knee pain- synovial fluid aspirated by orthopedics on 8/8 showed no growth. Continue Toradol 15 mg IV every 6 hours 5. 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 He has been started on Suboxone by internal medicine attending [name]. Will continue with Suboxone,Bentyl, Catapres tapering dose to help with withdrawal symptoms.  6. Tenosynovitis of right wrist- tenosynovitis likely infectious confirmed on MRI 11/09/2016. Continue antibiotics as above. 7. History of juvenile rheumatoid arthritis- patient has been taking sulfasalazine for 2-3 years but stopped because he didn't think it was helping. 8. Anemia- likely due to malnutrition in setting of IV drug abuse. Iron level 42, saturation  16%, ferritin 276, B12 1379, folate 18.2.    DVT prophylaxis: Lovenox  Code Status: Full code  Family Communication: No family present at bedside  Disposition Plan: Pending improvement of multiple medical problems   Consultants:  Infectious disease   Procedures:  None  Continuous infusions . vancomycin Stopped (11/11/16 1450)      Antibiotics:   Anti-infectives    Start     Dose/Rate Route Frequency Ordered Stop   11/10/16 1400  aztreonam (AZACTAM) 2 g in dextrose 5 % 50 mL IVPB  Status:  Discontinued     2 g 100 mL/hr over 30 Minutes Intravenous Every 8 hours 11/10/16 0740 11/10/16 0912   11/10/16 1400  vancomycin (VANCOCIN) IVPB 1000 mg/200 mL premix     1,000 mg 200 mL/hr over 60 Minutes Intravenous Every 8 hours 11/10/16 0740     11/10/16 0730  aztreonam (AZACTAM) 2 g in dextrose 5 % 50 mL IVPB     2 g 100 mL/hr over 30 Minutes Intravenous  Once 11/10/16 0720 11/10/16 0930   11/10/16 0730  vancomycin (VANCOCIN) IVPB 1000 mg/200 mL premix  Status:  Discontinued     1,000 mg 200 mL/hr over 60 Minutes Intravenous  Once 11/10/16 0720 11/10/16 0726   11/10/16 0030  vancomycin (VANCOCIN) IVPB 1000 mg/200 mL premix     1,000 mg 200 mL/hr over 60 Minutes Intravenous  Once 11/10/16 0027 11/10/16 0756       Objective   Vitals:   11/11/16 0032 11/11/16 0456 11/11/16 0741 11/11/16 1132  BP: 125/75 122/69 126/82   Pulse: (!) 114 (!) 104 (!) 106 (!) 105  Resp:  19 16 15   Temp: 99.5  F (37.5 C) 98 F (36.7 C)  98.4 F (36.9 C)  TempSrc: Oral Oral Oral Oral  SpO2: 98% 98% 99% 99%  Weight:  73.8 kg (162 lb 11.2 oz)    Height:        Intake/Output Summary (Last 24 hours) at 11/11/16 1704 Last data filed at 11/11/16 1300  Gross per 24 hour  Intake          1706.25 ml  Output             3050 ml  Net         -1343.75 ml   Filed Weights   11/10/16 1749 11/11/16 0456  Weight: 75.6 kg (166 lb 11.2 oz) 73.8 kg (162 lb 11.2 oz)     Physical  Examination:   Physical Exam: Eyes: No icterus, extraocular muscles intact  Mouth: Oral mucosa is moist, no lesions on palate,  Neck: Supple, no deformities, masses, or tenderness Lungs: Normal respiratory effort, bilateral clear to auscultation, no crackles or wheezes.  Heart: Regular rate and rhythm, S1 and S2 normal, no murmurs, rubs auscultated Abdomen: BS normoactive,soft,nondistended,non-tender to palpation,no organomegaly Extremities: Left knee and right wrist in dressing  Neuro : Alert and oriented to time, place and person, No focal deficits       Skin: No rashes seen on exam     Data Reviewed: I have personally reviewed following labs and imaging studies  CBG: No results for input(s): GLUCAP in the last 168 hours.  CBC:  Recent Labs Lab 11/07/16 1824 11/09/16 0257 11/09/16 2214 11/11/16 0250  WBC 12.9* 16.4* 17.6* 17.8*  NEUTROABS  --  12.0*  --   --   HGB 11.1* 9.9* 9.9* 9.0*  HCT 31.4* 27.9* 29.1* 25.9*  MCV 82.4 83.0 82.7 80.7  PLT 193 189 241 310    Basic Metabolic Panel:  Recent Labs Lab 11/07/16 1824 11/09/16 0257 11/09/16 2214 11/10/16 1804 11/11/16 0250  NA 134* 135 133*  --  134*  K 3.7 3.7 4.0  --  3.4*  CL 99* 101 101  --  102  CO2 26 28 25   --  24  GLUCOSE 117* 113* 115*  --  137*  BUN 15 10 6   --  8  CREATININE 0.66 0.64 0.59*  --  0.63  CALCIUM 8.6* 8.2* 8.0*  --  7.8*  MG  --   --   --  1.9  --     Recent Results (from the past 240 hour(s))  Culture, blood (routine x 2)     Status: Abnormal   Collection Time: 11/08/16 12:15 AM  Result Value Ref Range Status   Specimen Description BLOOD RIGHT ARM  Final   Special Requests   Final    BOTTLES DRAWN AEROBIC AND ANAEROBIC Blood Culture adequate volume   Culture  Setup Time   Final    GRAM POSITIVE COCCI IN CLUSTERS IN BOTH AEROBIC AND ANAEROBIC BOTTLES CRITICAL RESULT CALLED TO, READ BACK BY AND VERIFIED WITH: A. HARTLEY RN 11/18/16 1835 BEAMJ    Culture (A)  Final     STAPHYLOCOCCUS AUREUS SUSCEPTIBILITIES PERFORMED ON PREVIOUS CULTURE WITHIN THE LAST 5 DAYS. Performed at [facility], [address] [place], [place] [id number]    Report Status 11/10/2016 FINAL  Final  Culture, blood (routine x 2)     Status: Abnormal   Collection Time: 11/08/16 12:18 AM  Result Value Ref Range Status   Specimen Description BLOOD BLOOD RIGHT HAND  Final   Special Requests IN PEDIATRIC BOTTLE Blood Culture adequate volume  Final   Culture  Setup Time   Final    GRAM POSITIVE COCCI IN CLUSTERS IN PEDIATRIC BOTTLE CRITICAL VALUE NOTED.  VALUE IS CONSISTENT WITH PREVIOUSLY REPORTED AND CALLED VALUE. Performed at [facility], [address], [place], [place] [address]    Culture STAPHYLOCOCCUS AUREUS (A)  Final   Report Status 11/10/2016 FINAL  Final   Organism ID, Bacteria STAPHYLOCOCCUS AUREUS  Final      Susceptibility   Staphylococcus aureus - MIC*    CIPROFLOXACIN <=0.5 SENSITIVE Sensitive     ERYTHROMYCIN >=8 RESISTANT Resistant     GENTAMICIN <=0.5 SENSITIVE Sensitive     OXACILLIN 0.5 SENSITIVE Sensitive     TETRACYCLINE <=1 SENSITIVE Sensitive     VANCOMYCIN <=0.5 SENSITIVE Sensitive     TRIMETH/SULFA <=10 SENSITIVE Sensitive     CLINDAMYCIN RESISTANT Resistant     RIFAMPIN <=0.5 SENSITIVE Sensitive     Inducible Clindamycin POSITIVE Resistant     * STAPHYLOCOCCUS AUREUS  Blood Culture ID Panel (Reflexed)     Status: Abnormal   Collection Time: 11/08/16 12:18 AM  Result Value Ref Range Status   Enterococcus species NOT DETECTED NOT DETECTED Final   Listeria monocytogenes NOT DETECTED NOT DETECTED Final   Staphylococcus species DETECTED (A) NOT DETECTED Final    Comment: CRITICAL RESULT CALLED TO, READ BACK BY AND VERIFIED WITH: A. HARTLEY RN 11/08/16 1835 BEAMJ    Staphylococcus aureus DETECTED (A) NOT DETECTED Final    Comment: Methicillin (oxacillin) susceptible Staphylococcus aureus (MSSA). Preferred therapy is anti staphylococcal beta  lactam antibiotic (Cefazolin or Nafcillin), unless clinically contraindicated. CRITICAL RESULT CALLED TO, READ BACK BY AND VERIFIED WITH: A. HARTLEY RN 11/08/16 1835 BEAMJ    Methicillin resistance NOT DETECTED NOT DETECTED Final   Streptococcus species NOT DETECTED NOT DETECTED Final   Streptococcus agalactiae NOT DETECTED NOT DETECTED Final   Streptococcus pneumoniae NOT DETECTED NOT DETECTED Final   Streptococcus pyogenes NOT DETECTED NOT DETECTED Final   Acinetobacter baumannii NOT DETECTED NOT DETECTED Final   Enterobacteriaceae species NOT DETECTED NOT DETECTED Final   Enterobacter cloacae complex NOT DETECTED NOT DETECTED Final   Escherichia coli NOT DETECTED NOT DETECTED Final   Klebsiella oxytoca NOT DETECTED NOT DETECTED Final   Klebsiella pneumoniae NOT DETECTED NOT DETECTED Final   Proteus species NOT DETECTED NOT DETECTED Final   Serratia marcescens NOT DETECTED NOT DETECTED Final   Haemophilus influenzae NOT DETECTED NOT DETECTED Final   Neisseria meningitidis NOT DETECTED NOT DETECTED Final   Pseudomonas aeruginosa NOT DETECTED NOT DETECTED Final   Candida albicans NOT DETECTED NOT DETECTED Final   Candida glabrata NOT DETECTED NOT DETECTED Final   Candida krusei NOT DETECTED NOT DETECTED Final   Candida parapsilosis NOT DETECTED NOT DETECTED Final   Candida tropicalis NOT DETECTED NOT DETECTED Final    Comment: Performed at [facility], [address] [place], [place] [address]  Culture, blood (Routine X 2) w Reflex to ID Panel     Status: Abnormal   Collection Time: 11/09/16  2:58 AM  Result Value Ref Range Status   Specimen Description BLOOD LEFT ANTECUBITAL  Final   Special Requests   Final    BOTTLES DRAWN AEROBIC ONLY Blood Culture adequate volume   Culture  Setup Time   Final    GRAM POSITIVE COCCI IN CLUSTERS AEROBIC BOTTLE ONLY CRITICAL RESULT CALLED TO, READ BACK BY AND  VERIFIED WITH: PHARMD Judie Petit BELL [id number] MLM    Culture (A)  Final     STAPHYLOCOCCUS AUREUS SUSCEPTIBILITIES PERFORMED ON PREVIOUS CULTURE WITHIN THE LAST 5 DAYS. Performed at [facility], [address] [place], [place] [address]    Report Status 11/10/2016 FINAL  Final  Body fluid culture     Status: None (Preliminary result)   Collection Time: 11/09/16  4:12 AM  Result Value Ref Range Status   Specimen Description SYNOVIAL  Final   Special Requests NONE  Final   Gram Stain   Final    ABUNDANT WBC PRESENT,BOTH PMN AND MONONUCLEAR NO ORGANISMS SEEN    Culture   Final    NO GROWTH 2 DAYS INTERPRET RESULTS WITH CAUTION DUE TO LIMITED SPECIMEN VOLUME Performed at [facility], [address], [place], [place] [address]    Report Status PENDING  Incomplete  Culture, blood (Routine X 2) w Reflex to ID Panel     Status: Abnormal   Collection Time: 11/09/16  7:44 AM  Result Value Ref Range Status   Specimen Description BLOOD RIGHT ARM  Final   Special Requests IN PEDIATRIC BOTTLE Blood Culture adequate volume  Final   Culture  Setup Time   Final    GRAM POSITIVE COCCI IN CLUSTERS IN PEDIATRIC BOTTLE CRITICAL RESULT CALLED TO, READ BACK BY AND VERIFIED WITH: G.ABBOTT, PHARMD 11/10/16 0017 L.CHAMPION    Culture (A)  Final    STAPHYLOCOCCUS AUREUS SUSCEPTIBILITIES PERFORMED ON PREVIOUS CULTURE WITHIN THE LAST 5 DAYS. Performed at [facility], [address], [place], [place] [address]    Report Status 11/11/2016 FINAL  Final  Urine Culture     Status: Abnormal   Collection Time: 11/10/16 10:52 AM  Result Value Ref Range Status   Specimen Description URINE, RANDOM  Final   Special Requests NONE  Final   Culture <10,000 COLONIES/mL INSIGNIFICANT GROWTH (A)  Final   Report Status 11/11/2016 FINAL  Final     Liver Function Tests:  Recent Labs Lab 11/09/16 0257 11/11/16 0250  AST 44* 32  ALT 52 45  ALKPHOS 210* 217*  BILITOT 1.2 1.1  PROT 5.8* 5.3*  ALBUMIN 2.7* 2.1*   No results for input(s): LIPASE, AMYLASE in the last  168 hours. No results for input(s): AMMONIA in the last 168 hours.  Cardiac Enzymes: No results for input(s): CKTOTAL, CKMB, CKMBINDEX, TROPONINI in the last 168 hours. BNP (last 3 results)  Recent Labs  11/09/16 0258  BNP 139.6*    ProBNP (last 3 results) No results for input(s): PROBNP in the last 8760 hours.    Studies: Ct Angio Chest Pe W Or Wo Contrast  Result Date: 11/10/2016 CLINICAL DATA:  Septic arterial embolism. Tricuspid valve endocarditis. EXAM: CT ANGIOGRAPHY CHEST WITH CONTRAST TECHNIQUE: Multidetector CT imaging of the chest was performed using the standard protocol during bolus administration of intravenous contrast. Multiplanar CT image reconstructions and MIPs were obtained to evaluate the vascular anatomy. CONTRAST:  100 cc Isovue 370 intravenous COMPARISON:  11/04/2016 FINDINGS: Cardiovascular: Normal heart size. There is a small anterior pericardial effusion without visible serosal nodularity or thickening. The tricuspid valve is not characterized due to motion. Suboptimal contrast bolus. Injection rate was decreased due to IV access. There are at least 3 areas of subsegmental pulmonary artery thrombosis, including right upper lobe 7:152, right lower lobe 7:194, and lingula 7:167. Mediastinum/Nodes: Mild enlargement of mediastinal lymph nodes considered reactive in this setting. Lungs/Pleura: Numerous  ground-glass and consolidative nodules randomly distributed in the bilateral lungs, some with cavitation, consistent with septic emboli history. There is no pleural effusion or pneumothorax. No pulmonary edema. Upper Abdomen: No acute finding. Musculoskeletal: No acute finding.  No signs of osseous infection. Critical Value/emergent results were called by telephone at the time of interpretation on 11/10/2016 at 9:07 am to Dr. Konrad Dolores , who verbally acknowledged these results. Review of the MIP images confirms the above findings. IMPRESSION: 1. Bilateral subsegmental pulmonary  emboli associated with cavitating pneumonias and consistent with history of septic emboli. No effusion or air leak. 2. Small anterior pericardial effusion. Electronically Signed   By: Marnee Spring M.D.   On: 11/10/2016 09:07   Mr Foot Right Wo Contrast  Result Date: 11/10/2016 CLINICAL DATA:  Methicillin sensitive Staph aureus infection in blood cultures from August 7th. Patient presents with right foot pain and anterior right foot swelling. History of IV drug abuse with opiate addiction. EXAM: MRI OF THE RIGHT FOREFOOT WITHOUT CONTRAST TECHNIQUE: Multiplanar, multisequence MR imaging of the right foot was performed. No intravenous contrast was administered. COMPARISON:  None. FINDINGS: Bones/Joint/Cartilage Marrow signal abnormality involving the proximal half of the right third metatarsal shaft from proximal metaphysis through mid diaphysis noted with subtle cortical irregularity along the plantar and lateral aspect of the metatarsal concerning for changes of acute osteomyelitis. Ligaments Negative Muscles and Tendons No evidence of intramuscular abscess or myositis. Extensor and flexor tendons crossing the foot are of normal signal intensity morphology. Soft tissues Generalized soft tissue edema overlying the dorsum of the mid and forefoot without focal abscess. IMPRESSION: Marrow signal abnormalities involving the right third metatarsal shaft from proximal metaphysis to mid diaphysis. This in conjunction with mild cortical irregularity along the lateral aspect raise concern for changes of osteomyelitis. Electronically Signed   By: Tollie Eth M.D.   On: 11/10/2016 23:21   Mr Ankle Right Wo Contrast  Result Date: 11/10/2016 CLINICAL DATA:  Is methicillin sensitive Staph aureus in blood cultures. Midfoot pain. EXAM: MRI OF THE RIGHT ANKLE WITHOUT CONTRAST TECHNIQUE: Multiplanar, multisequence MR imaging of the ankle was performed. No intravenous contrast was administered. COMPARISON:  None. FINDINGS:  Patient refused IV contrast limiting assessment for osteomyelitis. TENDONS Peroneal: Intact peroneus longus and peroneus brevis tendons. Posteromedial: Intact tibialis posterior, flexor hallucis longus and flexor digitorum longus tendons. Anterior: Intact tibialis anterior, extensor hallucis longus and extensor digitorum longus tendons. Achilles: Intact. Plantar Fascia: Intact. LIGAMENTS Lateral: Intact. Medial: Intact. CARTILAGE Ankle Joint: Small ankle joint effusion.  No chondral defects. Subtalar Joints/Sinus Tarsi: Trace fluid in the subtalar joint also noted. Bones: Marrow signal abnormality involving the third metatarsal shaft from proximal metaphysis through the mid diaphysis. This in conjunction with history of positive blood cultures for staph and overlying soft tissue swelling, findings raise concern for osteomyelitis. Other: None IMPRESSION: Findings suspicious for osteomyelitis of the proximal third metatarsal shaft. Trace tibiotalar and subtalar joint fluid.  No abscess identified. Electronically Signed   By: Tollie Eth M.D.   On: 11/10/2016 23:25    Scheduled Meds: . [START ON 11/12/2016] buprenorphine-naloxone  1 tablet Sublingual BID  . cloNIDine  0.1 mg Oral QID   Followed by  . [START ON 11/12/2016] cloNIDine  0.1 mg Oral BH-[id number]   Followed by  . [START ON 11/15/2016] cloNIDine  0.1 mg Oral QAC breakfast  . enoxaparin (LOVENOX) injection  40 mg Subcutaneous Q24H  . ketorolac  15 mg Intravenous Q6H  . pantoprazole  40 mg Oral Daily  .  sodium chloride flush  3 mL Intravenous Q12H      Time spent: 20 min  [facility]   [facility] Pager [phone]. If 7PM-7AM, please contact night-coverage at [email or web address], Office  [phone]  password [email or web address]  11/11/2016, 5:04 PM  LOS: 1 day

## 2016-11-12 LAB — COMMENT2 - HEP PANEL

## 2016-11-12 LAB — BLOOD CULTURE ID PANEL (REFLEXED)
ACINETOBACTER BAUMANNII: NOT DETECTED
CANDIDA ALBICANS: NOT DETECTED
CANDIDA PARAPSILOSIS: NOT DETECTED
Candida glabrata: NOT DETECTED
Candida krusei: NOT DETECTED
Candida tropicalis: NOT DETECTED
Carbapenem resistance: NOT DETECTED
ENTEROBACTER CLOACAE COMPLEX: NOT DETECTED
Enterobacteriaceae species: NOT DETECTED
Enterococcus species: NOT DETECTED
Escherichia coli: NOT DETECTED
Haemophilus influenzae: NOT DETECTED
KLEBSIELLA OXYTOCA: NOT DETECTED
Klebsiella pneumoniae: NOT DETECTED
Listeria monocytogenes: NOT DETECTED
Methicillin resistance: NOT DETECTED
NEISSERIA MENINGITIDIS: NOT DETECTED
PSEUDOMONAS AERUGINOSA: NOT DETECTED
Proteus species: NOT DETECTED
STAPHYLOCOCCUS AUREUS BCID: DETECTED — AB
STREPTOCOCCUS AGALACTIAE: NOT DETECTED
STREPTOCOCCUS PNEUMONIAE: NOT DETECTED
STREPTOCOCCUS SPECIES: NOT DETECTED
Serratia marcescens: NOT DETECTED
Staphylococcus species: DETECTED — AB
Streptococcus pyogenes: NOT DETECTED
VANCOMYCIN RESISTANCE: NOT DETECTED

## 2016-11-12 LAB — BODY FLUID CULTURE

## 2016-11-12 LAB — COMPREHENSIVE METABOLIC PANEL
ALK PHOS: 199 U/L — AB (ref 38–126)
ALT: 43 U/L (ref 17–63)
ANION GAP: 8 (ref 5–15)
AST: 35 U/L (ref 15–41)
Albumin: 2.3 g/dL — ABNORMAL LOW (ref 3.5–5.0)
BILIRUBIN TOTAL: 1.4 mg/dL — AB (ref 0.3–1.2)
BUN: 17 mg/dL (ref 6–20)
CALCIUM: 8.4 mg/dL — AB (ref 8.9–10.3)
CO2: 26 mmol/L (ref 22–32)
CREATININE: 0.67 mg/dL (ref 0.61–1.24)
Chloride: 102 mmol/L (ref 101–111)
GFR calc Af Amer: >60 mL/min (ref >60)
GFR calc non Af Amer: >60 mL/min (ref >60)
Glucose, Bld: 91 mg/dL (ref 65–99)
Potassium: 4.9 mmol/L (ref 3.5–5.1)
Sodium: 136 mmol/L (ref 135–145)
TOTAL PROTEIN: 6.3 g/dL — AB (ref 6.5–8.1)

## 2016-11-12 LAB — CBC
HCT: 31.1 % — ABNORMAL LOW (ref 39.0–52.0)
Hemoglobin: 11 g/dL — ABNORMAL LOW (ref 13.0–17.0)
MCH: 29.4 pg (ref 26.0–34.0)
MCHC: 35.4 g/dL (ref 30.0–36.0)
MCV: 83.2 fL (ref 78.0–100.0)
PLATELETS: 371 10*3/uL (ref 150–400)
RBC: 3.74 MIL/uL — ABNORMAL LOW (ref 4.22–5.81)
RDW: 15.5 % (ref 11.5–15.5)
WBC: 12.4 10*3/uL — ABNORMAL HIGH (ref 4.0–10.5)

## 2016-11-12 LAB — VANCOMYCIN, TROUGH: Vancomycin Tr: 10 ug/mL — ABNORMAL LOW (ref 15–20)

## 2016-11-12 LAB — HEPATITIS C ANTIBODY (REFLEX): HCV Ab: >11 s/co ratio — ABNORMAL HIGH (ref 0.0–0.9)

## 2016-11-12 LAB — HEPATITIS B SURFACE ANTIGEN: Hepatitis B Surface Ag: NEGATIVE

## 2016-11-12 MED ORDER — VANCOMYCIN HCL 10 G IV SOLR
1750.0000 mg | INTRAVENOUS | Status: AC
  Administered 2016-11-12: 1750 mg via INTRAVENOUS
  Filled 2016-11-12: qty 1750

## 2016-11-12 MED ORDER — VANCOMYCIN HCL 10 G IV SOLR
1250.0000 mg | Freq: Three times a day (TID) | INTRAVENOUS | Status: DC
  Administered 2016-11-12 – 2016-11-13 (×4): 1250 mg via INTRAVENOUS
  Filled 2016-11-12 (×6): qty 1250

## 2016-11-12 MED ORDER — VANCOMYCIN HCL 10 G IV SOLR: 1500.0000 mg | Freq: Three times a day (TID) | INTRAVENOUS | Status: DC

## 2016-11-12 NOTE — Progress Notes (Signed)
  PHARMACY - PHYSICIAN COMMUNICATION CRITICAL VALUE ALERT - BLOOD CULTURE IDENTIFICATION (BCID)  Results for orders placed or performed during the hospital encounter of 11/10/16  Blood Culture ID Panel (Reflexed) (Collected: 11/11/2016  6:44 PM)  Result Value Ref Range   Enterococcus species NOT DETECTED NOT DETECTED   Vancomycin resistance NOT DETECTED NOT DETECTED   Listeria monocytogenes NOT DETECTED NOT DETECTED   Staphylococcus species DETECTED (A) NOT DETECTED   Staphylococcus aureus DETECTED (A) NOT DETECTED   Methicillin resistance NOT DETECTED NOT DETECTED   Streptococcus species NOT DETECTED NOT DETECTED   Streptococcus agalactiae NOT DETECTED NOT DETECTED   Streptococcus pneumoniae NOT DETECTED NOT DETECTED   Streptococcus pyogenes NOT DETECTED NOT DETECTED   Acinetobacter baumannii NOT DETECTED NOT DETECTED   Enterobacteriaceae species NOT DETECTED NOT DETECTED   Enterobacter cloacae complex NOT DETECTED NOT DETECTED   Escherichia coli NOT DETECTED NOT DETECTED   Klebsiella oxytoca NOT DETECTED NOT DETECTED   Klebsiella pneumoniae NOT DETECTED NOT DETECTED   Proteus species NOT DETECTED NOT DETECTED   Serratia marcescens NOT DETECTED NOT DETECTED   Carbapenem resistance NOT DETECTED NOT DETECTED   Haemophilus influenzae NOT DETECTED NOT DETECTED   Neisseria meningitidis NOT DETECTED NOT DETECTED   Pseudomonas aeruginosa NOT DETECTED NOT DETECTED   Candida albicans NOT DETECTED NOT DETECTED   Candida glabrata NOT DETECTED NOT DETECTED   Candida krusei NOT DETECTED NOT DETECTED   Candida parapsilosis NOT DETECTED NOT DETECTED   Candida tropicalis NOT DETECTED NOT DETECTED    Name of physician (or Provider) Contacted: Lama  Changes to prescribed antibiotics required: none  Almon HerculesBaird, Zymere Patlan P 11/12/2016  2:43 PM

## 2016-11-12 NOTE — Progress Notes (Signed)
[facility]  PROGRESS NOTE  Cody Rios ZOX:[id number] DOB: 1990/10/20 DOA: 11/10/2016 PCP: System, [name]   Brief HPI:   26 y.o. male who presents with IVDA w/ known MSSA bacteremia, endocarditis w/ multifocal pneumonia and septic pulmonary emboli. Of note pt left [facility] hospital the day prior AMA. Patient was called at home for MSSA  bacteremia and he came back to hospital.   Subjective   Patient continues to complain of generalized body aches.   Assessment/Plan:     1. Tricuspid valve endocarditis/septic pulmonary emboli- CT chest shows bilateral multifocal pneumonia likely septic emboli from right side endocarditis. TTE shows vegetation on tricuspid valve with significant tricuspid regurgitation and high PA pressure. Blood cultures x 2  positive for MSSA. Patient is currently on vancomycin. ID and Cardiology  are following. Plan for TEE on Monday. 2. MSSA bacteremia- blood cultures on 11/09/2016 showed MSSA bacteremia in 2 out of 2 bottles. Patient currently on vancomycin per pharmacy. ID following. 3. Right ankle osteomyelitis- MRI of the right ankle shows osteomyelitis, continue antibiotics as above. 4. Left knee pain- synovial fluid aspirated by orthopedics on 8/8 showed no growth. Continue Toradol 15 mg IV every 6 hours 5. 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 He has been started on Suboxone by internal medicine attending [name]. Will continue with Suboxone,Bentyl, Catapres tapering dose to help with withdrawal symptoms.  6. Tenosynovitis of right wrist- tenosynovitis likely infectious confirmed on MRI 11/09/2016. Continue antibiotics as above. 7. History of juvenile rheumatoid arthritis- patient has been taking sulfasalazine for 2-3 years but stopped because he didn't think it was helping. 8. Anemia- hemoglobin is stable at 11.0, likely due to malnutrition in setting of IV drug abuse. Iron level 42,  saturation 16%, ferritin 276, B12 1379, folate 18.2.    DVT prophylaxis: Lovenox  Code Status: Full code  Family Communication: No family present at bedside  Disposition Plan: Pending improvement of multiple medical problems   Consultants:  Infectious disease   Procedures:  None  Continuous infusions . vancomycin Stopped (11/12/16 0759)      Antibiotics:   Anti-infectives    Start     Dose/Rate Route Frequency Ordered Stop   11/10/16 1400  aztreonam (AZACTAM) 2 g in dextrose 5 % 50 mL IVPB  Status:  Discontinued     2 g 100 mL/hr over 30 Minutes Intravenous Every 8 hours 11/10/16 0740 11/10/16 0912   11/10/16 1400  vancomycin (VANCOCIN) IVPB 1000 mg/200 mL premix     1,000 mg 200 mL/hr over 60 Minutes Intravenous Every 8 hours 11/10/16 0740     11/10/16 0730  aztreonam (AZACTAM) 2 g in dextrose 5 % 50 mL IVPB     2 g 100 mL/hr over 30 Minutes Intravenous  Once 11/10/16 0720 11/10/16 0930   11/10/16 0730  vancomycin (VANCOCIN) IVPB 1000 mg/200 mL premix  Status:  Discontinued     1,000 mg 200 mL/hr over 60 Minutes Intravenous  Once 11/10/16 0720 11/10/16 0726   11/10/16 0030  vancomycin (VANCOCIN) IVPB 1000 mg/200 mL premix     1,000 mg 200 mL/hr over 60 Minutes Intravenous  Once 11/10/16 0027 11/10/16 0756       Objective   Vitals:   11/11/16 1132 11/11/16 2051 11/11/16 2302 11/12/16 0700  BP:  103/83 116/64 119/72  Pulse: (!) 105 (!) 102 82 80  Resp: 15 18    Temp: 98.4 F (36.9  C)  98.9 F (37.2 C) 98.1 F (36.7 C)  TempSrc: Oral  Oral Oral  SpO2: 99% 96% 99% 99%  Weight:    72.7 kg (160 lb 3.2 oz)  Height:        Intake/Output Summary (Last 24 hours) at 11/12/16 1227 Last data filed at 11/12/16 0340  Gross per 24 hour  Intake              880 ml  Output              325 ml  Net              555 ml   Filed Weights   11/10/16 1749 11/11/16 0456 11/12/16 0700  Weight: 75.6 kg (166 lb 11.2 oz) 73.8 kg (162 lb 11.2 oz) 72.7 kg (160 lb 3.2 oz)      Physical Examination:  Physical Exam: Eyes: No icterus, extraocular muscles intact  Mouth: Oral mucosa is moist, no lesions on palate,  Neck: Supple, no deformities, masses, or tenderness Lungs: Normal respiratory effort, bilateral clear to auscultation, no crackles or wheezes.  Heart: Regular rate and rhythm, S1 and S2 normal, no murmurs, rubs auscultated Abdomen: BS normoactive,soft,nondistended,non-tender to palpation,no organomegaly Extremities: Right wrist and left knee in  bandage Neuro : Alert and oriented to time, place and person, No focal deficits Skin: No rashes seen on exam     Data Reviewed: I have personally reviewed following labs and imaging studies  CBG: No results for input(s): GLUCAP in the last 168 hours.  CBC:  Recent Labs Lab 11/07/16 1824 11/09/16 0257 11/09/16 2214 11/11/16 0250 11/12/16 0545  WBC 12.9* 16.4* 17.6* 17.8* 12.4*  NEUTROABS  --  12.0*  --   --   --   HGB 11.1* 9.9* 9.9* 9.0* 11.0*  HCT 31.4* 27.9* 29.1* 25.9* 31.1*  MCV 82.4 83.0 82.7 80.7 83.2  PLT 193 189 241 310 371    Basic Metabolic Panel:  Recent Labs Lab 11/07/16 1824 11/09/16 0257 11/09/16 2214 11/10/16 1804 11/11/16 0250 11/12/16 0545  NA 134* 135 133*  --  134* 136  K 3.7 3.7 4.0  --  3.4* 4.9  CL 99* 101 101  --  102 102  CO2 26 28 25   --  24 26  GLUCOSE 117* 113* 115*  --  137* 91  BUN 15 10 6   --  8 17  CREATININE 0.66 0.64 0.59*  --  0.63 0.67  CALCIUM 8.6* 8.2* 8.0*  --  7.8* 8.4*  MG  --   --   --  1.9  --   --     Recent Results (from the past 240 hour(s))  Culture, blood (routine x 2)     Status: Abnormal   Collection Time: 11/08/16 12:15 AM  Result Value Ref Range Status   Specimen Description BLOOD RIGHT ARM  Final   Special Requests   Final    BOTTLES DRAWN AEROBIC AND ANAEROBIC Blood Culture adequate volume   Culture  Setup Time   Final    GRAM POSITIVE COCCI IN CLUSTERS IN BOTH AEROBIC AND ANAEROBIC BOTTLES CRITICAL RESULT CALLED  TO, READ BACK BY AND VERIFIED WITH: A. HARTLEY RN 11/18/16 1835 BEAMJ    Culture (A)  Final    STAPHYLOCOCCUS AUREUS SUSCEPTIBILITIES PERFORMED ON PREVIOUS CULTURE WITHIN THE LAST 5 DAYS. Performed at [facility], [address], [place], [place] [address]    Report Status 11/10/2016 FINAL  Final  Culture,  blood (routine x 2)     Status: Abnormal   Collection Time: 11/08/16 12:18 AM  Result Value Ref Range Status   Specimen Description BLOOD BLOOD RIGHT HAND  Final   Special Requests IN PEDIATRIC BOTTLE Blood Culture adequate volume  Final   Culture  Setup Time   Final    GRAM POSITIVE COCCI IN CLUSTERS IN PEDIATRIC BOTTLE CRITICAL VALUE NOTED.  VALUE IS CONSISTENT WITH PREVIOUSLY REPORTED AND CALLED VALUE. Performed at [facility], [address] [place], [place] [address]    Culture STAPHYLOCOCCUS AUREUS (A)  Final   Report Status 11/10/2016 FINAL  Final   Organism ID, Bacteria STAPHYLOCOCCUS AUREUS  Final      Susceptibility   Staphylococcus aureus - MIC*    CIPROFLOXACIN <=0.5 SENSITIVE Sensitive     ERYTHROMYCIN >=8 RESISTANT Resistant     GENTAMICIN <=0.5 SENSITIVE Sensitive     OXACILLIN 0.5 SENSITIVE Sensitive     TETRACYCLINE <=1 SENSITIVE Sensitive     VANCOMYCIN <=0.5 SENSITIVE Sensitive     TRIMETH/SULFA <=10 SENSITIVE Sensitive     CLINDAMYCIN RESISTANT Resistant     RIFAMPIN <=0.5 SENSITIVE Sensitive     Inducible Clindamycin POSITIVE Resistant     * STAPHYLOCOCCUS AUREUS  Blood Culture ID Panel (Reflexed)     Status: Abnormal   Collection Time: 11/08/16 12:18 AM  Result Value Ref Range Status   Enterococcus species NOT DETECTED NOT DETECTED Final   Listeria monocytogenes NOT DETECTED NOT DETECTED Final   Staphylococcus species DETECTED (A) NOT DETECTED Final    Comment: CRITICAL RESULT CALLED TO, READ BACK BY AND VERIFIED WITH: A. HARTLEY RN 11/08/16 1835 BEAMJ    Staphylococcus aureus DETECTED (A) NOT DETECTED Final    Comment: Methicillin  (oxacillin) susceptible Staphylococcus aureus (MSSA). Preferred therapy is anti staphylococcal beta lactam antibiotic (Cefazolin or Nafcillin), unless clinically contraindicated. CRITICAL RESULT CALLED TO, READ BACK BY AND VERIFIED WITH: A. HARTLEY RN 11/08/16 1835 BEAMJ    Methicillin resistance NOT DETECTED NOT DETECTED Final   Streptococcus species NOT DETECTED NOT DETECTED Final   Streptococcus agalactiae NOT DETECTED NOT DETECTED Final   Streptococcus pneumoniae NOT DETECTED NOT DETECTED Final   Streptococcus pyogenes NOT DETECTED NOT DETECTED Final   Acinetobacter baumannii NOT DETECTED NOT DETECTED Final   Enterobacteriaceae species NOT DETECTED NOT DETECTED Final   Enterobacter cloacae complex NOT DETECTED NOT DETECTED Final   Escherichia coli NOT DETECTED NOT DETECTED Final   Klebsiella oxytoca NOT DETECTED NOT DETECTED Final   Klebsiella pneumoniae NOT DETECTED NOT DETECTED Final   Proteus species NOT DETECTED NOT DETECTED Final   Serratia marcescens NOT DETECTED NOT DETECTED Final   Haemophilus influenzae NOT DETECTED NOT DETECTED Final   Neisseria meningitidis NOT DETECTED NOT DETECTED Final   Pseudomonas aeruginosa NOT DETECTED NOT DETECTED Final   Candida albicans NOT DETECTED NOT DETECTED Final   Candida glabrata NOT DETECTED NOT DETECTED Final   Candida krusei NOT DETECTED NOT DETECTED Final   Candida parapsilosis NOT DETECTED NOT DETECTED Final   Candida tropicalis NOT DETECTED NOT DETECTED Final    Comment: Performed at [facility], [address] [place], [place] [address]  Culture, blood (Routine X 2) w Reflex to ID Panel     Status: Abnormal   Collection Time: 11/09/16  2:58 AM  Result Value Ref Range Status   Specimen Description BLOOD LEFT ANTECUBITAL  Final   Special Requests   Final    BOTTLES DRAWN AEROBIC ONLY  Blood Culture adequate volume   Culture  Setup Time   Final    GRAM POSITIVE COCCI IN CLUSTERS AEROBIC BOTTLE ONLY CRITICAL RESULT CALLED  TO, READ BACK BY AND VERIFIED WITH: PHARMD M BELL [id number] MLM    Culture (A)  Final    STAPHYLOCOCCUS AUREUS SUSCEPTIBILITIES PERFORMED ON PREVIOUS CULTURE WITHIN THE LAST 5 DAYS. Performed at [facility], [address] [place], [place] [address]    Report Status 11/10/2016 FINAL  Final  Body fluid culture     Status: None   Collection Time: 11/09/16  4:12 AM  Result Value Ref Range Status   Specimen Description SYNOVIAL  Final   Special Requests NONE  Final   Gram Stain   Final    ABUNDANT WBC PRESENT,BOTH PMN AND MONONUCLEAR NO ORGANISMS SEEN    Culture   Final    NO GROWTH 3 DAYS INTERPRET RESULTS WITH CAUTION DUE TO LIMITED SPECIMEN VOLUME Performed at [facility], [address] [place], [place] [address]    Report Status 11/12/2016 FINAL  Final  Culture, blood (Routine X 2) w Reflex to ID Panel     Status: Abnormal   Collection Time: 11/09/16  7:44 AM  Result Value Ref Range Status   Specimen Description BLOOD RIGHT ARM  Final   Special Requests IN PEDIATRIC BOTTLE Blood Culture adequate volume  Final   Culture  Setup Time   Final    GRAM POSITIVE COCCI IN CLUSTERS IN PEDIATRIC BOTTLE CRITICAL RESULT CALLED TO, READ BACK BY AND VERIFIED WITH: G.ABBOTT, PHARMD 11/10/16 0017 L.CHAMPION    Culture (A)  Final    STAPHYLOCOCCUS AUREUS SUSCEPTIBILITIES PERFORMED ON PREVIOUS CULTURE WITHIN THE LAST 5 DAYS. Performed at [facility], [address], [place], [place] [address]    Report Status 11/11/2016 FINAL  Final  Urine Culture     Status: Abnormal   Collection Time: 11/10/16 10:52 AM  Result Value Ref Range Status   Specimen Description URINE, RANDOM  Final   Special Requests NONE  Final   Culture <10,000 COLONIES/mL INSIGNIFICANT GROWTH (A)  Final   Report Status 11/11/2016 FINAL  Final  Culture, blood (Routine X 2) w Reflex to ID Panel     Status: None (Preliminary result)   Collection Time: 11/11/16  6:44 PM  Result Value Ref Range Status    Specimen Description BLOOD LEFT HAND  Final   Special Requests   Final    BOTTLES DRAWN AEROBIC AND ANAEROBIC Blood Culture adequate volume   Culture NO GROWTH < 24 HOURS  Final   Report Status PENDING  Incomplete  Culture, blood (Routine X 2) w Reflex to ID Panel     Status: None (Preliminary result)   Collection Time: 11/11/16  6:44 PM  Result Value Ref Range Status   Specimen Description BLOOD LEFT ARM  Final   Special Requests IN PEDIATRIC BOTTLE Blood Culture adequate volume  Final   Culture  Setup Time   Final    GRAM POSITIVE COCCI IN CLUSTERS IN PEDIATRIC BOTTLE Organism ID to follow    Culture GRAM POSITIVE COCCI  Final   Report Status PENDING  Incomplete     Liver Function Tests:  Recent Labs Lab 11/09/16 0257 11/11/16 0250 11/12/16 0545  AST 44* 32 35  ALT 52 45 43  ALKPHOS 210* 217* 199*  BILITOT 1.2 1.1 1.4*  PROT 5.8* 5.3* 6.3*  ALBUMIN 2.7* 2.1* 2.3*   No results for  input(s): LIPASE, AMYLASE in the last 168 hours. No results for input(s): AMMONIA in the last 168 hours.  Cardiac Enzymes: No results for input(s): CKTOTAL, CKMB, CKMBINDEX, TROPONINI in the last 168 hours. BNP (last 3 results)  Recent Labs  11/09/16 0258  BNP 139.6*    ProBNP (last 3 results) No results for input(s): PROBNP in the last 8760 hours.    Studies: Mr Foot Right Wo Contrast  Result Date: 11/10/2016 CLINICAL DATA:  Methicillin sensitive Staph aureus infection in blood cultures from August 7th. Patient presents with right foot pain and anterior right foot swelling. History of IV drug abuse with opiate addiction. EXAM: MRI OF THE RIGHT FOREFOOT WITHOUT CONTRAST TECHNIQUE: Multiplanar, multisequence MR imaging of the right foot was performed. No intravenous contrast was administered. COMPARISON:  None. FINDINGS: Bones/Joint/Cartilage Marrow signal abnormality involving the proximal half of the right third metatarsal shaft from proximal metaphysis through mid diaphysis noted  with subtle cortical irregularity along the plantar and lateral aspect of the metatarsal concerning for changes of acute osteomyelitis. Ligaments Negative Muscles and Tendons No evidence of intramuscular abscess or myositis. Extensor and flexor tendons crossing the foot are of normal signal intensity morphology. Soft tissues Generalized soft tissue edema overlying the dorsum of the mid and forefoot without focal abscess. IMPRESSION: Marrow signal abnormalities involving the right third metatarsal shaft from proximal metaphysis to mid diaphysis. This in conjunction with mild cortical irregularity along the lateral aspect raise concern for changes of osteomyelitis. Electronically Signed   By: Tollie Eth M.D.   On: 11/10/2016 23:21   Mr Ankle Right Wo Contrast  Result Date: 11/10/2016 CLINICAL DATA:  Is methicillin sensitive Staph aureus in blood cultures. Midfoot pain. EXAM: MRI OF THE RIGHT ANKLE WITHOUT CONTRAST TECHNIQUE: Multiplanar, multisequence MR imaging of the ankle was performed. No intravenous contrast was administered. COMPARISON:  None. FINDINGS: Patient refused IV contrast limiting assessment for osteomyelitis. TENDONS Peroneal: Intact peroneus longus and peroneus brevis tendons. Posteromedial: Intact tibialis posterior, flexor hallucis longus and flexor digitorum longus tendons. Anterior: Intact tibialis anterior, extensor hallucis longus and extensor digitorum longus tendons. Achilles: Intact. Plantar Fascia: Intact. LIGAMENTS Lateral: Intact. Medial: Intact. CARTILAGE Ankle Joint: Small ankle joint effusion.  No chondral defects. Subtalar Joints/Sinus Tarsi: Trace fluid in the subtalar joint also noted. Bones: Marrow signal abnormality involving the third metatarsal shaft from proximal metaphysis through the mid diaphysis. This in conjunction with history of positive blood cultures for staph and overlying soft tissue swelling, findings raise concern for osteomyelitis. Other: None IMPRESSION:  Findings suspicious for osteomyelitis of the proximal third metatarsal shaft. Trace tibiotalar and subtalar joint fluid.  No abscess identified. Electronically Signed   By: Tollie Eth M.D.   On: 11/10/2016 23:25    Scheduled Meds: . buprenorphine-naloxone  1 tablet Sublingual BID  . cloNIDine  0.1 mg Oral QID   Followed by  . cloNIDine  0.1 mg Oral BH-qamhs   Followed by  . [START ON 11/15/2016] cloNIDine  0.1 mg Oral QAC breakfast  . enoxaparin (LOVENOX) injection  40 mg Subcutaneous Q24H  . ketorolac  15 mg Intravenous Q6H  . pantoprazole  40 mg Oral Daily  . sodium chloride flush  3 mL Intravenous Q12H      Time spent: 20 min  [facility]   [facility] Pager [phone]. If 7PM-7AM, please contact night-coverage at [email or web address], Office  [phone]  password [email or web address]  11/12/2016, 12:27 PM  LOS: 2 days

## 2016-11-12 NOTE — Progress Notes (Addendum)
Pharmacy Antibiotic Note  Cody SaxDustin A Mair is a 26 y.o. male admitted on 11/10/2016 with sepsis.  Pharmacy has been consulted for Vancomycin dosing.  Vanc trough subtherapeutic, WBC much improved and Cr stable  Plan: Vancomycin 1750mg  IV x 1, then 1250mg  IV q8 Check VT at steady state F/U repeat cx  Height: 6\' 2"  (188 cm) Weight: 160 lb 3.2 oz (72.7 kg) IBW/kg (Calculated) : 82.2  Temp (24hrs), Avg:98.5 F (36.9 C), Min:98.1 F (36.7 C), Max:98.9 F (37.2 C)   Recent Labs Lab 11/07/16 1824 11/09/16 0257 11/09/16 0313 11/09/16 1030 11/09/16 1356 11/09/16 2214 11/10/16 1216 11/11/16 0250 11/12/16 0545  WBC 12.9* 16.4*  --   --   --  17.6*  --  17.8* 12.4*  CREATININE 0.66 0.64  --   --   --  0.59*  --  0.63 0.67  LATICACIDVEN  --   --  0.88 1.3 2.1*  --  1.1  --   --   VANCOTROUGH  --   --   --   --   --   --   --   --  10*    Estimated Creatinine Clearance: 142.6 mL/min (by C-G formula based on SCr of 0.67 mg/dL).    Allergies  Allergen Reactions  . Cephalosporins Hives  . Penicillins Hives, Shortness Of Breath and Rash    8/9 Azactam x 1 dose 8/9 vancomycin >>  8/11 VT 10 (on 1000mg  IV q8)- true trough  8/10 blood x 2  GPC-clusters 8/9 urine- neg 8/8 HIV: non reactive  8/8 BCx: px 8/7: BCx: GPC/clusters 2/2; MSSA per BCID 8/7 synovial: ox   Thank you for allowing pharmacy to be a part of this patient's care.   Alvester MorinKendra Shawntina Diffee, B.S., PharmD Clinical Pharmacist  [facility]- [facility]

## 2016-11-12 NOTE — Progress Notes (Signed)
Subjective: No new complaints   Antibiotics:  Anti-infectives    Start     Dose/Rate Route Frequency Ordered Stop   11/12/16 2200  vancomycin (VANCOCIN) 1,500 mg in sodium chloride 0.9 % 500 mL IVPB  Status:  Discontinued     1,500 mg 250 mL/hr over 120 Minutes Intravenous Every 8 hours 11/12/16 1255 11/12/16 1346   11/12/16 2200  vancomycin (VANCOCIN) 1,250 mg in sodium chloride 0.9 % 250 mL IVPB     1,250 mg 166.7 mL/hr over 90 Minutes Intravenous Every 8 hours 11/12/16 1346     11/12/16 1300  vancomycin (VANCOCIN) 1,750 mg in sodium chloride 0.9 % 500 mL IVPB     1,750 mg 250 mL/hr over 120 Minutes Intravenous NOW 11/12/16 1255 11/12/16 1533   11/10/16 1400  aztreonam (AZACTAM) 2 g in dextrose 5 % 50 mL IVPB  Status:  Discontinued     2 g 100 mL/hr over 30 Minutes Intravenous Every 8 hours 11/10/16 0740 11/10/16 0912   11/10/16 1400  vancomycin (VANCOCIN) IVPB 1000 mg/200 mL premix  Status:  Discontinued     1,000 mg 200 mL/hr over 60 Minutes Intravenous Every 8 hours 11/10/16 0740 11/12/16 1255   11/10/16 0730  aztreonam (AZACTAM) 2 g in dextrose 5 % 50 mL IVPB     2 g 100 mL/hr over 30 Minutes Intravenous  Once 11/10/16 0720 11/10/16 0930   11/10/16 0730  vancomycin (VANCOCIN) IVPB 1000 mg/200 mL premix  Status:  Discontinued     1,000 mg 200 mL/hr over 60 Minutes Intravenous  Once 11/10/16 0720 11/10/16 0726   11/10/16 0030  vancomycin (VANCOCIN) IVPB 1000 mg/200 mL premix     1,000 mg 200 mL/hr over 60 Minutes Intravenous  Once 11/10/16 0027 11/10/16 0756      Medications: Scheduled Meds: . buprenorphine-naloxone  1 tablet Sublingual BID  . cloNIDine  0.1 mg Oral QID   Followed by  . cloNIDine  0.1 mg Oral BH-qamhs   Followed by  . [START ON 11/15/2016] cloNIDine  0.1 mg Oral QAC breakfast  . enoxaparin (LOVENOX) injection  40 mg Subcutaneous Q24H  . ketorolac  15 mg Intravenous Q6H  . pantoprazole  40 mg Oral Daily  . sodium chloride flush  3 mL  Intravenous Q12H   Continuous Infusions: . vancomycin     PRN Meds:.acetaminophen **OR** acetaminophen, dicyclomine, hydrOXYzine, loperamide, methocarbamol, ondansetron **OR** ondansetron (ZOFRAN) IV    Objective: Weight change: -6 lb 8 oz (-2.948 kg)  Intake/Output Summary (Last 24 hours) at 11/12/16 1607 Last data filed at 11/12/16 0340  Gross per 24 hour  Intake              640 ml  Output                0 ml  Net              640 ml   Blood pressure 119/72, pulse 80, temperature 98.1 F (36.7 C), temperature source Oral, resp. rate 18, height 6\' 2"  (1.88 m), weight 160 lb 3.2 oz (72.7 kg), SpO2 99 %. Temp:  [98.1 F (36.7 C)-98.9 F (37.2 C)] 98.1 F (36.7 C) (08/11 0700) Pulse Rate:  [80-102] 80 (08/11 0700) Resp:  [18] 18 (08/10 2051) BP: (103-119)/(64-83) 119/72 (08/11 0700) SpO2:  [96 %-99 %] 99 % (08/11 0700) Weight:  [160 lb 3.2 oz (72.7 kg)] 160 lb 3.2 oz (72.7 kg) (08/11 0700)  Physical Exam:  General: Alert and awake, oriented x3, not in any acute distress. HEENT: anicteric sclera,EOMI CVS tachy rate, normal r,  no murmur rubs or gallops Chest now wheezes or resp distress Abdomen: soft nontender, nondistended, normal bowel sounds, Extremities:tender right hand, knee and foot esp toes Skin: no rashes Neuro: nonfocal  CBC: CBC Latest Ref Rng & Units 11/12/2016 11/11/2016 11/09/2016  WBC 4.0 - 10.5 K/uL 12.4(H) 17.8(H) 17.6(H)  Hemoglobin 13.0 - 17.0 g/dL 11.0(L) 9.0(L) 9.9(L)  Hematocrit 39.0 - 52.0 % 31.1(L) 25.9(L) 29.1(L)  Platelets 150 - 400 K/uL 371 310 241      BMET  Recent Labs  11/11/16 0250 11/12/16 0545  NA 134* 136  K 3.4* 4.9  CL 102 102  CO2 24 26  GLUCOSE 137* 91  BUN 8 17  CREATININE 0.63 0.67  CALCIUM 7.8* 8.4*     Liver Panel   Recent Labs  11/11/16 0250 11/12/16 0545  PROT 5.3* 6.3*  ALBUMIN 2.1* 2.3*  AST 32 35  ALT 45 43  ALKPHOS 217* 199*  BILITOT 1.1 1.4*       Sedimentation Rate No results for  input(s): ESRSEDRATE in the last 72 hours. C-Reactive Protein No results for input(s): CRP in the last 72 hours.  Micro Results: Recent Results (from the past 720 hour(s))  Culture, blood (routine x 2)     Status: Abnormal   Collection Time: 11/08/16 12:15 AM  Result Value Ref Range Status   Specimen Description BLOOD RIGHT ARM  Final   Special Requests   Final    BOTTLES DRAWN AEROBIC AND ANAEROBIC Blood Culture adequate volume   Culture  Setup Time   Final    GRAM POSITIVE COCCI IN CLUSTERS IN BOTH AEROBIC AND ANAEROBIC BOTTLES CRITICAL RESULT CALLED TO, READ BACK BY AND VERIFIED WITH: A. HARTLEY RN 11/18/16 1835 BEAMJ    Culture (A)  Final    STAPHYLOCOCCUS AUREUS SUSCEPTIBILITIES PERFORMED ON PREVIOUS CULTURE WITHIN THE LAST 5 DAYS. Performed at [facility], [address] [place], [place] [address]    Report Status 11/10/2016 FINAL  Final  Culture, blood (routine x 2)     Status: Abnormal   Collection Time: 11/08/16 12:18 AM  Result Value Ref Range Status   Specimen Description BLOOD BLOOD RIGHT HAND  Final   Special Requests IN PEDIATRIC BOTTLE Blood Culture adequate volume  Final   Culture  Setup Time   Final    GRAM POSITIVE COCCI IN CLUSTERS IN PEDIATRIC BOTTLE CRITICAL VALUE NOTED.  VALUE IS CONSISTENT WITH PREVIOUSLY REPORTED AND CALLED VALUE. Performed at [facility], [address], [place], [place] [address]    Culture STAPHYLOCOCCUS AUREUS (A)  Final   Report Status 11/10/2016 FINAL  Final   Organism ID, Bacteria STAPHYLOCOCCUS AUREUS  Final      Susceptibility   Staphylococcus aureus - MIC*    CIPROFLOXACIN <=0.5 SENSITIVE Sensitive     ERYTHROMYCIN >=8 RESISTANT Resistant     GENTAMICIN <=0.5 SENSITIVE Sensitive     OXACILLIN 0.5 SENSITIVE Sensitive     TETRACYCLINE <=1 SENSITIVE Sensitive     VANCOMYCIN <=0.5 SENSITIVE Sensitive     TRIMETH/SULFA <=10 SENSITIVE Sensitive     CLINDAMYCIN RESISTANT Resistant     RIFAMPIN <=0.5 SENSITIVE  Sensitive     Inducible Clindamycin POSITIVE Resistant     * STAPHYLOCOCCUS AUREUS  Blood Culture ID Panel (Reflexed)     Status: Abnormal   Collection Time: 11/08/16 12:18 AM  Result Value  Ref Range Status   Enterococcus species NOT DETECTED NOT DETECTED Final   Listeria monocytogenes NOT DETECTED NOT DETECTED Final   Staphylococcus species DETECTED (A) NOT DETECTED Final    Comment: CRITICAL RESULT CALLED TO, READ BACK BY AND VERIFIED WITH: A. HARTLEY RN 11/08/16 1835 BEAMJ    Staphylococcus aureus DETECTED (A) NOT DETECTED Final    Comment: Methicillin (oxacillin) susceptible Staphylococcus aureus (MSSA). Preferred therapy is anti staphylococcal beta lactam antibiotic (Cefazolin or Nafcillin), unless clinically contraindicated. CRITICAL RESULT CALLED TO, READ BACK BY AND VERIFIED WITH: A. HARTLEY RN 11/08/16 1835 BEAMJ    Methicillin resistance NOT DETECTED NOT DETECTED Final   Streptococcus species NOT DETECTED NOT DETECTED Final   Streptococcus agalactiae NOT DETECTED NOT DETECTED Final   Streptococcus pneumoniae NOT DETECTED NOT DETECTED Final   Streptococcus pyogenes NOT DETECTED NOT DETECTED Final   Acinetobacter baumannii NOT DETECTED NOT DETECTED Final   Enterobacteriaceae species NOT DETECTED NOT DETECTED Final   Enterobacter cloacae complex NOT DETECTED NOT DETECTED Final   Escherichia coli NOT DETECTED NOT DETECTED Final   Klebsiella oxytoca NOT DETECTED NOT DETECTED Final   Klebsiella pneumoniae NOT DETECTED NOT DETECTED Final   Proteus species NOT DETECTED NOT DETECTED Final   Serratia marcescens NOT DETECTED NOT DETECTED Final   Haemophilus influenzae NOT DETECTED NOT DETECTED Final   Neisseria meningitidis NOT DETECTED NOT DETECTED Final   Pseudomonas aeruginosa NOT DETECTED NOT DETECTED Final   Candida albicans NOT DETECTED NOT DETECTED Final   Candida glabrata NOT DETECTED NOT DETECTED Final   Candida krusei NOT DETECTED NOT DETECTED Final   Candida parapsilosis  NOT DETECTED NOT DETECTED Final   Candida tropicalis NOT DETECTED NOT DETECTED Final    Comment: Performed at [facility], [address] [place], [place] [address]  Culture, blood (Routine X 2) w Reflex to ID Panel     Status: Abnormal   Collection Time: 11/09/16  2:58 AM  Result Value Ref Range Status   Specimen Description BLOOD LEFT ANTECUBITAL  Final   Special Requests   Final    BOTTLES DRAWN AEROBIC ONLY Blood Culture adequate volume   Culture  Setup Time   Final    GRAM POSITIVE COCCI IN CLUSTERS AEROBIC BOTTLE ONLY CRITICAL RESULT CALLED TO, READ BACK BY AND VERIFIED WITH: PHARMD M BELL [id number] MLM    Culture (A)  Final    STAPHYLOCOCCUS AUREUS SUSCEPTIBILITIES PERFORMED ON PREVIOUS CULTURE WITHIN THE LAST 5 DAYS. Performed at [facility], [address], [place], [place] [address]    Report Status 11/10/2016 FINAL  Final  Body fluid culture     Status: None   Collection Time: 11/09/16  4:12 AM  Result Value Ref Range Status   Specimen Description SYNOVIAL  Final   Special Requests NONE  Final   Gram Stain   Final    ABUNDANT WBC PRESENT,BOTH PMN AND MONONUCLEAR NO ORGANISMS SEEN    Culture   Final    NO GROWTH 3 DAYS INTERPRET RESULTS WITH CAUTION DUE TO LIMITED SPECIMEN VOLUME Performed at [facility], [address] [place], [place] [address]    Report Status 11/12/2016 FINAL  Final  Culture, blood (Routine X 2) w Reflex to ID Panel     Status: Abnormal   Collection Time: 11/09/16  7:44 AM  Result Value Ref Range Status   Specimen Description BLOOD RIGHT ARM  Final   Special Requests IN PEDIATRIC BOTTLE Blood Culture adequate volume  Final   Culture  Setup Time   Final    GRAM POSITIVE COCCI IN CLUSTERS IN PEDIATRIC BOTTLE CRITICAL RESULT CALLED TO, READ BACK BY AND VERIFIED WITH: G.ABBOTT, PHARMD 11/10/16 0017 L.CHAMPION    Culture (A)  Final    STAPHYLOCOCCUS AUREUS SUSCEPTIBILITIES PERFORMED ON PREVIOUS CULTURE WITHIN THE LAST 5  DAYS. Performed at [facility], [address], [place], [place] [address]    Report Status 11/11/2016 FINAL  Final  Urine Culture     Status: Abnormal   Collection Time: 11/10/16 10:52 AM  Result Value Ref Range Status   Specimen Description URINE, RANDOM  Final   Special Requests NONE  Final   Culture <10,000 COLONIES/mL INSIGNIFICANT GROWTH (A)  Final   Report Status 11/11/2016 FINAL  Final  Culture, blood (Routine X 2) w Reflex to ID Panel     Status: None (Preliminary result)   Collection Time: 11/11/16  6:44 PM  Result Value Ref Range Status   Specimen Description BLOOD LEFT HAND  Final   Special Requests   Final    BOTTLES DRAWN AEROBIC AND ANAEROBIC Blood Culture adequate volume   Culture  Setup Time   Final    GRAM POSITIVE COCCI IN CLUSTERS ANAEROBIC BOTTLE ONLY CRITICAL VALUE NOTED.  VALUE IS CONSISTENT WITH PREVIOUSLY REPORTED AND CALLED VALUE.    Culture GRAM POSITIVE COCCI  Final   Report Status PENDING  Incomplete  Culture, blood (Routine X 2) w Reflex to ID Panel     Status: None (Preliminary result)   Collection Time: 11/11/16  6:44 PM  Result Value Ref Range Status   Specimen Description BLOOD LEFT ARM  Final   Special Requests IN PEDIATRIC BOTTLE Blood Culture adequate volume  Final   Culture  Setup Time   Final    GRAM POSITIVE COCCI IN CLUSTERS IN PEDIATRIC BOTTLE Organism ID to follow CRITICAL RESULT CALLED TO, READ BACK BY AND VERIFIED WITH: H BAIRD 11/12/16 @ 1326 M VESTAL    Culture GRAM POSITIVE COCCI  Final   Report Status PENDING  Incomplete  Blood Culture ID Panel (Reflexed)     Status: Abnormal   Collection Time: 11/11/16  6:44 PM  Result Value Ref Range Status   Enterococcus species NOT DETECTED NOT DETECTED Final   Vancomycin resistance NOT DETECTED NOT DETECTED Final   Listeria monocytogenes NOT DETECTED NOT DETECTED Final   Staphylococcus species DETECTED (A) NOT DETECTED Final    Comment: CRITICAL RESULT CALLED TO, READ BACK BY AND  VERIFIED WITH: H BAIRD 11/12/16 @ 1326 M VESTAL    Staphylococcus aureus DETECTED (A) NOT DETECTED Final    Comment: CRITICAL RESULT CALLED TO, READ BACK BY AND VERIFIED WITH: H BAIRD 11/12/16 @ 1326 M VESTAL    Methicillin resistance NOT DETECTED NOT DETECTED Final   Streptococcus species NOT DETECTED NOT DETECTED Final   Streptococcus agalactiae NOT DETECTED NOT DETECTED Final   Streptococcus pneumoniae NOT DETECTED NOT DETECTED Final   Streptococcus pyogenes NOT DETECTED NOT DETECTED Final   Acinetobacter baumannii NOT DETECTED NOT DETECTED Final   Enterobacteriaceae species NOT DETECTED NOT DETECTED Final   Enterobacter cloacae complex NOT DETECTED NOT DETECTED Final   Escherichia coli NOT DETECTED NOT DETECTED Final   Klebsiella oxytoca NOT DETECTED NOT DETECTED Final   Klebsiella pneumoniae NOT DETECTED NOT DETECTED Final   Proteus species NOT DETECTED NOT DETECTED Final   Serratia marcescens NOT DETECTED NOT DETECTED Final   Carbapenem resistance NOT DETECTED NOT DETECTED  Final   Haemophilus influenzae NOT DETECTED NOT DETECTED Final   Neisseria meningitidis NOT DETECTED NOT DETECTED Final   Pseudomonas aeruginosa NOT DETECTED NOT DETECTED Final   Candida albicans NOT DETECTED NOT DETECTED Final   Candida glabrata NOT DETECTED NOT DETECTED Final   Candida krusei NOT DETECTED NOT DETECTED Final   Candida parapsilosis NOT DETECTED NOT DETECTED Final   Candida tropicalis NOT DETECTED NOT DETECTED Final    Studies/Results: Mr Foot Right Wo Contrast  Result Date: 11/10/2016 CLINICAL DATA:  Methicillin sensitive Staph aureus infection in blood cultures from August 7th. Patient presents with right foot pain and anterior right foot swelling. History of IV drug abuse with opiate addiction. EXAM: MRI OF THE RIGHT FOREFOOT WITHOUT CONTRAST TECHNIQUE: Multiplanar, multisequence MR imaging of the right foot was performed. No intravenous contrast was administered. COMPARISON:  None. FINDINGS:  Bones/Joint/Cartilage Marrow signal abnormality involving the proximal half of the right third metatarsal shaft from proximal metaphysis through mid diaphysis noted with subtle cortical irregularity along the plantar and lateral aspect of the metatarsal concerning for changes of acute osteomyelitis. Ligaments Negative Muscles and Tendons No evidence of intramuscular abscess or myositis. Extensor and flexor tendons crossing the foot are of normal signal intensity morphology. Soft tissues Generalized soft tissue edema overlying the dorsum of the mid and forefoot without focal abscess. IMPRESSION: Marrow signal abnormalities involving the right third metatarsal shaft from proximal metaphysis to mid diaphysis. This in conjunction with mild cortical irregularity along the lateral aspect raise concern for changes of osteomyelitis. Electronically Signed   By: Tollie Eth M.D.   On: 11/10/2016 23:21   Mr Ankle Right Wo Contrast  Result Date: 11/10/2016 CLINICAL DATA:  Is methicillin sensitive Staph aureus in blood cultures. Midfoot pain. EXAM: MRI OF THE RIGHT ANKLE WITHOUT CONTRAST TECHNIQUE: Multiplanar, multisequence MR imaging of the ankle was performed. No intravenous contrast was administered. COMPARISON:  None. FINDINGS: Patient refused IV contrast limiting assessment for osteomyelitis. TENDONS Peroneal: Intact peroneus longus and peroneus brevis tendons. Posteromedial: Intact tibialis posterior, flexor hallucis longus and flexor digitorum longus tendons. Anterior: Intact tibialis anterior, extensor hallucis longus and extensor digitorum longus tendons. Achilles: Intact. Plantar Fascia: Intact. LIGAMENTS Lateral: Intact. Medial: Intact. CARTILAGE Ankle Joint: Small ankle joint effusion.  No chondral defects. Subtalar Joints/Sinus Tarsi: Trace fluid in the subtalar joint also noted. Bones: Marrow signal abnormality involving the third metatarsal shaft from proximal metaphysis through the mid diaphysis. This in  conjunction with history of positive blood cultures for staph and overlying soft tissue swelling, findings raise concern for osteomyelitis. Other: None IMPRESSION: Findings suspicious for osteomyelitis of the proximal third metatarsal shaft. Trace tibiotalar and subtalar joint fluid.  No abscess identified. Electronically Signed   By: Tollie Eth M.D.   On: 11/10/2016 23:25      Assessment/Plan:  INTERVAL HISTORY: Blood cultures are persistently positive   Principal Problem:   Endocarditis of tricuspid valve Active Problems:   Severe tricuspid regurgitation   Pulmonary HTN (HCC)   Multifocal pneumonia   ?? 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 male with  history of IV drug use admitted with methicillin sensitive Staphylococcus aureus bacteremia and sepsis with tricuspid valve endocarditis  and high PA pressures, septic emboli to the lungs, infected wrist with possible tenosynovitis, knee effusion and right foot with evidence of osteomyelitis of one of his toes.       [facility] Staphylococcus aureus bacteremia   Staphylococcus aureus bacteremia (SAB) is associated with a high rate of complications and mortality.  Specific aspects of clinical management are critical to optimizing the outcome of patients with SAB.  Therefore, the [facility] Antimicrobial Management Team [facility]) has initiated an intervention aimed at improving the management of SAB at [facility].  To do so, Infectious Diseases physicians are providing an evidence-based consult for the management of all patients with SAB.     Yes No Comments  Perform follow-up blood cultures (even if the patient is afebrile) to ensure  clearance of bacteremia [x]  []  REPEAT BLOOD CULTURES AGAIN  Remove vascular catheter and obtain follow-up blood cultures after the removal of the catheter []  []  DO NOT PLACE CENTRAL LINE  Perform echocardiography to evaluate for endocarditis (transthoracic ECHO is 40-50% sensitive, TEE is > 90% sensitive) []  []  Please keep in mind, that neither test can definitively EXCLUDE endocarditis, and that should clinical suspicion remain high for endocarditis the patient should then still be treated with an "endocarditis" duration of therapy = 6 weeks he will get TEE next week  Consult electrophysiologist to evaluate implanted cardiac device (pacemaker, ICD) []  []    Ensure source control []  []  Have all abscesses been drained effectively? Have deep seeded infections (septic joints or osteomyelitis) had appropriate surgical debridement?  I am worried his wrist needs surgical attention  Certainly with osteo in the toe that also needs to be addressed  My understanding is Ortho have been consulted   Investigate for "metastatic" sites of infection []  []  Does the patient have ANY symptom or physical exam finding that would suggest a deeper infection (back or neck pain that may be suggestive of vertebral osteomyelitis or epidural abscess, muscle pain that could be a symptom of pyomyositis)?  Keep in mind that for deep seeded infections MRI imaging with contrast is preferred rather than other often insensitive tests such as plain x-rays, especially early in a patient's presentation.  Will consider MRI knee if pain does not improve there  Change antibiotic therapy to vancomycin []  []  Beta-lactam antibiotics are preferred for MSSA due to higher cure rates.   If on Vancomycin, goal trough should be 15 - 20 mcg/mL  Estimated duration of IV antibiotic therapy:  6 weeks []  []  Consult case management for probably prolonged outpatient IV antibiotic therapy    #2 IVDU: Greatly appreciate Dr. Andree Coss help #3 Hep C +:  order genotype and VL   LOS: 2 days   Acey Lav 11/12/2016, 4:07 PM

## 2016-11-13 LAB — CBC
HCT: 30.3 % — ABNORMAL LOW (ref 39.0–52.0)
Hemoglobin: 10.1 g/dL — ABNORMAL LOW (ref 13.0–17.0)
MCH: 27.8 pg (ref 26.0–34.0)
MCHC: 33.3 g/dL (ref 30.0–36.0)
MCV: 83.5 fL (ref 78.0–100.0)
PLATELETS: 422 10*3/uL — AB (ref 150–400)
RBC: 3.63 MIL/uL — ABNORMAL LOW (ref 4.22–5.81)
RDW: 15.3 % (ref 11.5–15.5)
WBC: 11.8 10*3/uL — AB (ref 4.0–10.5)

## 2016-11-13 NOTE — Progress Notes (Signed)
[facility]  PROGRESS NOTE  KREIG PARSON ZOX:[id number] DOB: April 01, 1991 DOA: 11/10/2016 PCP: System, [name]   Brief HPI:    26 y.o. male who presents with IVDA w/ known MSSA bacteremia, endocarditis w/ multifocal pneumonia and septic pulmonary emboli. Of note pt left [facility] hospital the day prior AMA. Patient was called at home for MSSA  bacteremia and he came back to hospital.   Subjective   Patient seen and examined, denies any pain.   Assessment/Plan:     1. Tricuspid valve endocarditis/septic pulmonary emboli- CT chest shows bilateral multifocal pneumonia likely septic emboli from right side endocarditis. TTE shows vegetation on tricuspid valve with significant tricuspid regurgitation and high PA pressure. Blood cultures x 2  positive for MSSA. Patient is currently on vancomycin. ID and Cardiology  are following. Plan for TEE on Monday. 2. MSSA bacteremia- blood cultures on 11/09/2016 showed MSSA bacteremia in 2 out of 2 bottles. Patient currently on vancomycin per pharmacy. ID following. 3. Right ankle osteomyelitis- MRI of the right ankle shows osteomyelitis, continue antibiotics as above. 4. Left knee pain- synovial fluid aspirated by orthopedics on 8/8 showed no growth. Continue Toradol 15 mg IV every 6 hours 5. 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 He has been started on Suboxone by internal medicine attending [name]. Will continue with Suboxone,Bentyl, Catapres tapering dose to help with withdrawal symptoms. No vertebral symptoms at this time. 6. Tenosynovitis of right wrist- tenosynovitis likely infectious confirmed on MRI 11/09/2016. Continue antibiotics as above. 7. History of juvenile rheumatoid arthritis- patient has been taking sulfasalazine for 2-3 years but stopped because he didn't think it was helping. 8. Anemia- hemoglobin is stable at 11.0, likely due to malnutrition in setting of IV drug  abuse. Iron level 42, saturation 16%, ferritin 276, B12 1379, folate 18.2.    DVT prophylaxis: Lovenox  Code Status: Full code  Family Communication: No family present at bedside  Disposition Plan: Pending improvement of multiple medical problems   Consultants:  Infectious disease   Procedures:  None  Continuous infusions . vancomycin 1,250 mg (11/13/16 1312)      Antibiotics:   Anti-infectives    Start     Dose/Rate Route Frequency Ordered Stop   11/12/16 2200  vancomycin (VANCOCIN) 1,500 mg in sodium chloride 0.9 % 500 mL IVPB  Status:  Discontinued     1,500 mg 250 mL/hr over 120 Minutes Intravenous Every 8 hours 11/12/16 1255 11/12/16 1346   11/12/16 2200  vancomycin (VANCOCIN) 1,250 mg in sodium chloride 0.9 % 250 mL IVPB     1,250 mg 166.7 mL/hr over 90 Minutes Intravenous Every 8 hours 11/12/16 1346     11/12/16 1300  vancomycin (VANCOCIN) 1,750 mg in sodium chloride 0.9 % 500 mL IVPB     1,750 mg 250 mL/hr over 120 Minutes Intravenous NOW 11/12/16 1255 11/12/16 1533   11/10/16 1400  aztreonam (AZACTAM) 2 g in dextrose 5 % 50 mL IVPB  Status:  Discontinued     2 g 100 mL/hr over 30 Minutes Intravenous Every 8 hours 11/10/16 0740 11/10/16 0912   11/10/16 1400  vancomycin (VANCOCIN) IVPB 1000 mg/200 mL premix  Status:  Discontinued     1,000 mg 200 mL/hr over 60 Minutes Intravenous Every 8 hours 11/10/16 0740 11/12/16 1255   11/10/16 0730  aztreonam (AZACTAM) 2 g in dextrose 5 % 50 mL IVPB     2 g 100  mL/hr over 30 Minutes Intravenous  Once 11/10/16 0720 11/10/16 0930   11/10/16 0730  vancomycin (VANCOCIN) IVPB 1000 mg/200 mL premix  Status:  Discontinued     1,000 mg 200 mL/hr over 60 Minutes Intravenous  Once 11/10/16 0720 11/10/16 0726   11/10/16 0030  vancomycin (VANCOCIN) IVPB 1000 mg/200 mL premix     1,000 mg 200 mL/hr over 60 Minutes Intravenous  Once 11/10/16 0027 11/10/16 0756       Objective   Vitals:   11/12/16 2212 11/12/16 2319  11/13/16 0001 11/13/16 0523  BP: 111/71 115/71  107/72  Pulse: 95 (!) 103  93  Resp:      Temp: 100 F (37.8 C) (!) 101.1 F (38.4 C) 99.5 F (37.5 C) 99.4 F (37.4 C)  TempSrc: Oral Oral Oral Oral  SpO2: 99% 97%  97%  Weight:    73 kg (160 lb 14.4 oz)  Height:        Intake/Output Summary (Last 24 hours) at 11/13/16 1329 Last data filed at 11/13/16 0648  Gross per 24 hour  Intake              990 ml  Output              800 ml  Net              190 ml   Filed Weights   11/11/16 0456 11/12/16 0700 11/13/16 0523  Weight: 73.8 kg (162 lb 11.2 oz) 72.7 kg (160 lb 3.2 oz) 73 kg (160 lb 14.4 oz)     Physical Examination:   Physical Exam: Eyes: No icterus, extraocular muscles intact  Mouth: Oral mucosa is moist, no lesions on palate,  Neck: Supple, no deformities, masses, or tenderness Lungs: Normal respiratory effort, bilateral clear to auscultation, no crackles or wheezes.  Heart: Regular rate and rhythm, S1 and S2 normal, no murmurs, rubs auscultated Abdomen: BS normoactive,soft,nondistended,non-tender to palpation,no organomegaly Extremities: No pretibial edema, no erythema, no cyanosis, no clubbing Neuro : Alert and oriented to time, place and person, No focal deficits Skin: No rashes seen on exam    Data Reviewed: I have personally reviewed following labs and imaging studies  CBG: No results for input(s): GLUCAP in the last 168 hours.  CBC:  Recent Labs Lab 11/09/16 0257 11/09/16 2214 11/11/16 0250 11/12/16 0545 11/13/16 0324  WBC 16.4* 17.6* 17.8* 12.4* 11.8*  NEUTROABS 12.0*  --   --   --   --   HGB 9.9* 9.9* 9.0* 11.0* 10.1*  HCT 27.9* 29.1* 25.9* 31.1* 30.3*  MCV 83.0 82.7 80.7 83.2 83.5  PLT 189 241 310 371 422*    Basic Metabolic Panel:  Recent Labs Lab 11/07/16 1824 11/09/16 0257 11/09/16 2214 11/10/16 1804 11/11/16 0250 11/12/16 0545  NA 134* 135 133*  --  134* 136  K 3.7 3.7 4.0  --  3.4* 4.9  CL 99* 101 101  --  102 102  CO2 26  28 25   --  24 26  GLUCOSE 117* 113* 115*  --  137* 91  BUN 15 10 6   --  8 17  CREATININE 0.66 0.64 0.59*  --  0.63 0.67  CALCIUM 8.6* 8.2* 8.0*  --  7.8* 8.4*  MG  --   --   --  1.9  --   --     Recent Results (from the past 240 hour(s))  Culture, blood (routine x 2)     Status: Abnormal  Collection Time: 11/08/16 12:15 AM  Result Value Ref Range Status   Specimen Description BLOOD RIGHT ARM  Final   Special Requests   Final    BOTTLES DRAWN AEROBIC AND ANAEROBIC Blood Culture adequate volume   Culture  Setup Time   Final    GRAM POSITIVE COCCI IN CLUSTERS IN BOTH AEROBIC AND ANAEROBIC BOTTLES CRITICAL RESULT CALLED TO, READ BACK BY AND VERIFIED WITH: A. HARTLEY RN 11/18/16 1835 BEAMJ    Culture (A)  Final    STAPHYLOCOCCUS AUREUS SUSCEPTIBILITIES PERFORMED ON PREVIOUS CULTURE WITHIN THE LAST 5 DAYS. Performed at [facility], [address] [place], [place] [address]    Report Status 11/10/2016 FINAL  Final  Culture, blood (routine x 2)     Status: Abnormal   Collection Time: 11/08/16 12:18 AM  Result Value Ref Range Status   Specimen Description BLOOD BLOOD RIGHT HAND  Final   Special Requests IN PEDIATRIC BOTTLE Blood Culture adequate volume  Final   Culture  Setup Time   Final    GRAM POSITIVE COCCI IN CLUSTERS IN PEDIATRIC BOTTLE CRITICAL VALUE NOTED.  VALUE IS CONSISTENT WITH PREVIOUSLY REPORTED AND CALLED VALUE. Performed at [facility], [address] [place], [place] [address]    Culture STAPHYLOCOCCUS AUREUS (A)  Final   Report Status 11/10/2016 FINAL  Final   Organism ID, Bacteria STAPHYLOCOCCUS AUREUS  Final      Susceptibility   Staphylococcus aureus - MIC*    CIPROFLOXACIN <=0.5 SENSITIVE Sensitive     ERYTHROMYCIN >=8 RESISTANT Resistant     GENTAMICIN <=0.5 SENSITIVE Sensitive     OXACILLIN 0.5 SENSITIVE Sensitive     TETRACYCLINE <=1 SENSITIVE Sensitive     VANCOMYCIN <=0.5 SENSITIVE Sensitive     TRIMETH/SULFA <=10 SENSITIVE Sensitive      CLINDAMYCIN RESISTANT Resistant     RIFAMPIN <=0.5 SENSITIVE Sensitive     Inducible Clindamycin POSITIVE Resistant     * STAPHYLOCOCCUS AUREUS  Blood Culture ID Panel (Reflexed)     Status: Abnormal   Collection Time: 11/08/16 12:18 AM  Result Value Ref Range Status   Enterococcus species NOT DETECTED NOT DETECTED Final   Listeria monocytogenes NOT DETECTED NOT DETECTED Final   Staphylococcus species DETECTED (A) NOT DETECTED Final    Comment: CRITICAL RESULT CALLED TO, READ BACK BY AND VERIFIED WITH: A. HARTLEY RN 11/08/16 1835 BEAMJ    Staphylococcus aureus DETECTED (A) NOT DETECTED Final    Comment: Methicillin (oxacillin) susceptible Staphylococcus aureus (MSSA). Preferred therapy is anti staphylococcal beta lactam antibiotic (Cefazolin or Nafcillin), unless clinically contraindicated. CRITICAL RESULT CALLED TO, READ BACK BY AND VERIFIED WITH: A. HARTLEY RN 11/08/16 1835 BEAMJ    Methicillin resistance NOT DETECTED NOT DETECTED Final   Streptococcus species NOT DETECTED NOT DETECTED Final   Streptococcus agalactiae NOT DETECTED NOT DETECTED Final   Streptococcus pneumoniae NOT DETECTED NOT DETECTED Final   Streptococcus pyogenes NOT DETECTED NOT DETECTED Final   Acinetobacter baumannii NOT DETECTED NOT DETECTED Final   Enterobacteriaceae species NOT DETECTED NOT DETECTED Final   Enterobacter cloacae complex NOT DETECTED NOT DETECTED Final   Escherichia coli NOT DETECTED NOT DETECTED Final   Klebsiella oxytoca NOT DETECTED NOT DETECTED Final   Klebsiella pneumoniae NOT DETECTED NOT DETECTED Final   Proteus species NOT DETECTED NOT DETECTED Final   Serratia marcescens NOT DETECTED NOT DETECTED Final   Haemophilus influenzae NOT DETECTED NOT DETECTED Final   Neisseria meningitidis NOT DETECTED NOT DETECTED Final  Pseudomonas aeruginosa NOT DETECTED NOT DETECTED Final   Candida albicans NOT DETECTED NOT DETECTED Final   Candida glabrata NOT DETECTED NOT DETECTED Final    Candida krusei NOT DETECTED NOT DETECTED Final   Candida parapsilosis NOT DETECTED NOT DETECTED Final   Candida tropicalis NOT DETECTED NOT DETECTED Final    Comment: Performed at [facility], [address] [place], [place] [address]  Culture, blood (Routine X 2) w Reflex to ID Panel     Status: Abnormal   Collection Time: 11/09/16  2:58 AM  Result Value Ref Range Status   Specimen Description BLOOD LEFT ANTECUBITAL  Final   Special Requests   Final    BOTTLES DRAWN AEROBIC ONLY Blood Culture adequate volume   Culture  Setup Time   Final    GRAM POSITIVE COCCI IN CLUSTERS AEROBIC BOTTLE ONLY CRITICAL RESULT CALLED TO, READ BACK BY AND VERIFIED WITH: PHARMD M BELL [id number] MLM    Culture (A)  Final    STAPHYLOCOCCUS AUREUS SUSCEPTIBILITIES PERFORMED ON PREVIOUS CULTURE WITHIN THE LAST 5 DAYS. Performed at [facility], [address], [place], [place] [address]    Report Status 11/10/2016 FINAL  Final  Body fluid culture     Status: None   Collection Time: 11/09/16  4:12 AM  Result Value Ref Range Status   Specimen Description SYNOVIAL  Final   Special Requests NONE  Final   Gram Stain   Final    ABUNDANT WBC PRESENT,BOTH PMN AND MONONUCLEAR NO ORGANISMS SEEN    Culture   Final    NO GROWTH 3 DAYS INTERPRET RESULTS WITH CAUTION DUE TO LIMITED SPECIMEN VOLUME Performed at [facility], [address], [place], [place] [address]    Report Status 11/12/2016 FINAL  Final  Culture, blood (Routine X 2) w Reflex to ID Panel     Status: Abnormal   Collection Time: 11/09/16  7:44 AM  Result Value Ref Range Status   Specimen Description BLOOD RIGHT ARM  Final   Special Requests IN PEDIATRIC BOTTLE Blood Culture adequate volume  Final   Culture  Setup Time   Final    GRAM POSITIVE COCCI IN CLUSTERS IN PEDIATRIC BOTTLE CRITICAL RESULT CALLED TO, READ BACK BY AND VERIFIED WITH: G.ABBOTT, PHARMD 11/10/16 0017 L.CHAMPION    Culture (A)  Final    STAPHYLOCOCCUS  AUREUS SUSCEPTIBILITIES PERFORMED ON PREVIOUS CULTURE WITHIN THE LAST 5 DAYS. Performed at [facility], [address], [place], [place] [address]    Report Status 11/11/2016 FINAL  Final  Urine Culture     Status: Abnormal   Collection Time: 11/10/16 10:52 AM  Result Value Ref Range Status   Specimen Description URINE, RANDOM  Final   Special Requests NONE  Final   Culture <10,000 COLONIES/mL INSIGNIFICANT GROWTH (A)  Final   Report Status 11/11/2016 FINAL  Final  Culture, blood (Routine X 2) w Reflex to ID Panel     Status: Abnormal (Preliminary result)   Collection Time: 11/11/16  6:44 PM  Result Value Ref Range Status   Specimen Description BLOOD LEFT HAND  Final   Special Requests   Final    BOTTLES DRAWN AEROBIC AND ANAEROBIC Blood Culture adequate volume   Culture  Setup Time   Final    GRAM POSITIVE COCCI IN CLUSTERS ANAEROBIC BOTTLE ONLY CRITICAL VALUE NOTED.  VALUE IS CONSISTENT WITH PREVIOUSLY REPORTED AND CALLED VALUE.    Culture (A)  Final  STAPHYLOCOCCUS AUREUS SUSCEPTIBILITIES TO FOLLOW    Report Status PENDING  Incomplete  Culture, blood (Routine X 2) w Reflex to ID Panel     Status: Abnormal (Preliminary result)   Collection Time: 11/11/16  6:44 PM  Result Value Ref Range Status   Specimen Description BLOOD LEFT ARM  Final   Special Requests IN PEDIATRIC BOTTLE Blood Culture adequate volume  Final   Culture  Setup Time   Final    GRAM POSITIVE COCCI IN CLUSTERS IN PEDIATRIC BOTTLE Organism ID to follow CRITICAL RESULT CALLED TO, READ BACK BY AND VERIFIED WITH: H BAIRD 11/12/16 @ 1326 M VESTAL    Culture (A)  Final    STAPHYLOCOCCUS AUREUS SUSCEPTIBILITIES TO FOLLOW    Report Status PENDING  Incomplete  Blood Culture ID Panel (Reflexed)     Status: Abnormal   Collection Time: 11/11/16  6:44 PM  Result Value Ref Range Status   Enterococcus species NOT DETECTED NOT DETECTED Final   Vancomycin resistance NOT DETECTED NOT DETECTED Final   Listeria  monocytogenes NOT DETECTED NOT DETECTED Final   Staphylococcus species DETECTED (A) NOT DETECTED Final    Comment: CRITICAL RESULT CALLED TO, READ BACK BY AND VERIFIED WITH: H BAIRD 11/12/16 @ 1326 M VESTAL    Staphylococcus aureus DETECTED (A) NOT DETECTED Final    Comment: CRITICAL RESULT CALLED TO, READ BACK BY AND VERIFIED WITH: H BAIRD 11/12/16 @ 1326 M VESTAL    Methicillin resistance NOT DETECTED NOT DETECTED Final   Streptococcus species NOT DETECTED NOT DETECTED Final   Streptococcus agalactiae NOT DETECTED NOT DETECTED Final   Streptococcus pneumoniae NOT DETECTED NOT DETECTED Final   Streptococcus pyogenes NOT DETECTED NOT DETECTED Final   Acinetobacter baumannii NOT DETECTED NOT DETECTED Final   Enterobacteriaceae species NOT DETECTED NOT DETECTED Final   Enterobacter cloacae complex NOT DETECTED NOT DETECTED Final   Escherichia coli NOT DETECTED NOT DETECTED Final   Klebsiella oxytoca NOT DETECTED NOT DETECTED Final   Klebsiella pneumoniae NOT DETECTED NOT DETECTED Final   Proteus species NOT DETECTED NOT DETECTED Final   Serratia marcescens NOT DETECTED NOT DETECTED Final   Carbapenem resistance NOT DETECTED NOT DETECTED Final   Haemophilus influenzae NOT DETECTED NOT DETECTED Final   Neisseria meningitidis NOT DETECTED NOT DETECTED Final   Pseudomonas aeruginosa NOT DETECTED NOT DETECTED Final   Candida albicans NOT DETECTED NOT DETECTED Final   Candida glabrata NOT DETECTED NOT DETECTED Final   Candida krusei NOT DETECTED NOT DETECTED Final   Candida parapsilosis NOT DETECTED NOT DETECTED Final   Candida tropicalis NOT DETECTED NOT DETECTED Final  Culture, blood (Routine X 2) w Reflex to ID Panel     Status: None (Preliminary result)   Collection Time: 11/12/16  4:30 PM  Result Value Ref Range Status   Specimen Description BLOOD LEFT ARM  Final   Special Requests   Final    BOTTLES DRAWN AEROBIC AND ANAEROBIC Blood Culture results may not be optimal due to an  excessive volume of blood received in culture bottles   Culture NO GROWTH < 24 HOURS  Final   Report Status PENDING  Incomplete  Culture, blood (Routine X 2) w Reflex to ID Panel     Status: None (Preliminary result)   Collection Time: 11/12/16  4:42 PM  Result Value Ref Range Status   Specimen Description BLOOD LEFT WRIST  Final   Special Requests   Final    BOTTLES DRAWN AEROBIC AND ANAEROBIC Blood Culture results  may not be optimal due to an excessive volume of blood received in culture bottles   Culture NO GROWTH < 24 HOURS  Final   Report Status PENDING  Incomplete     Liver Function Tests:  Recent Labs Lab 11/09/16 0257 11/11/16 0250 11/12/16 0545  AST 44* 32 35  ALT 52 45 43  ALKPHOS 210* 217* 199*  BILITOT 1.2 1.1 1.4*  PROT 5.8* 5.3* 6.3*  ALBUMIN 2.7* 2.1* 2.3*   No results for input(s): LIPASE, AMYLASE in the last 168 hours. No results for input(s): AMMONIA in the last 168 hours.  Cardiac Enzymes: No results for input(s): CKTOTAL, CKMB, CKMBINDEX, TROPONINI in the last 168 hours. BNP (last 3 results)  Recent Labs  11/09/16 0258  BNP 139.6*    ProBNP (last 3 results) No results for input(s): PROBNP in the last 8760 hours.    Studies: No results found.  Scheduled Meds: . buprenorphine-naloxone  1 tablet Sublingual BID  . cloNIDine  0.1 mg Oral BH-qamhs   Followed by  . [START ON 11/15/2016] cloNIDine  0.1 mg Oral QAC breakfast  . enoxaparin (LOVENOX) injection  40 mg Subcutaneous Q24H  . ketorolac  15 mg Intravenous Q6H  . pantoprazole  40 mg Oral Daily  . sodium chloride flush  3 mL Intravenous Q12H      Time spent: 20 min  [facility]   [facility] Pager [phone]. If 7PM-7AM, please contact night-coverage at [email or web address], Office  [phone]  password [email or web address]  11/13/2016, 1:29 PM  LOS: 3 days

## 2016-11-14 ENCOUNTER — Encounter (HOSPITAL_COMMUNITY)
Admission: EM | Disposition: A | Discharge status: Home / Self Care | Payer: Self-pay | Source: Home / Self Care | Attending: Family Medicine

## 2016-11-14 ENCOUNTER — Inpatient Hospital Stay (HOSPITAL_COMMUNITY): Payer: Self-pay | Admitting: Certified Registered Nurse Anesthetist

## 2016-11-14 ENCOUNTER — Encounter (HOSPITAL_COMMUNITY): Payer: Self-pay | Admitting: Certified Registered Nurse Anesthetist

## 2016-11-14 ENCOUNTER — Inpatient Hospital Stay (HOSPITAL_COMMUNITY): Payer: Self-pay

## 2016-11-14 DIAGNOSIS — F1110 Opioid abuse, uncomplicated: Secondary | ICD-10-CM

## 2016-11-14 DIAGNOSIS — I330 Acute and subacute infective endocarditis: Principal | ICD-10-CM

## 2016-11-14 DIAGNOSIS — I368 Other nonrheumatic tricuspid valve disorders: Secondary | ICD-10-CM

## 2016-11-14 DIAGNOSIS — M869 Osteomyelitis, unspecified: Secondary | ICD-10-CM

## 2016-11-14 DIAGNOSIS — R7881 Bacteremia: Secondary | ICD-10-CM

## 2016-11-14 DIAGNOSIS — I361 Nonrheumatic tricuspid (valve) insufficiency: Secondary | ICD-10-CM

## 2016-11-14 HISTORY — PX: TEE WITHOUT CARDIOVERSION: SHX5443

## 2016-11-14 LAB — BASIC METABOLIC PANEL
ANION GAP: 6 (ref 5–15)
BUN: 12 mg/dL (ref 6–20)
CALCIUM: 8.2 mg/dL — AB (ref 8.9–10.3)
CO2: 29 mmol/L (ref 22–32)
Chloride: 98 mmol/L — ABNORMAL LOW (ref 101–111)
Creatinine, Ser: 0.65 mg/dL (ref 0.61–1.24)
GFR calc Af Amer: >60 mL/min (ref >60)
Glucose, Bld: 112 mg/dL — ABNORMAL HIGH (ref 65–99)
POTASSIUM: 4.2 mmol/L (ref 3.5–5.1)
SODIUM: 133 mmol/L — AB (ref 135–145)

## 2016-11-14 LAB — HEPATITIS B SURFACE ANTIBODY, QUANTITATIVE: HEPATITIS B-POST: 556.5 m[IU]/mL

## 2016-11-14 LAB — CBC
HCT: 29.2 % — ABNORMAL LOW (ref 39.0–52.0)
HEMOGLOBIN: 9.9 g/dL — AB (ref 13.0–17.0)
MCH: 28.2 pg (ref 26.0–34.0)
MCHC: 33.9 g/dL (ref 30.0–36.0)
MCV: 83.2 fL (ref 78.0–100.0)
PLATELETS: 335 10*3/uL (ref 150–400)
RBC: 3.51 MIL/uL — AB (ref 4.22–5.81)
RDW: 15 % (ref 11.5–15.5)
WBC: 12.8 10*3/uL — ABNORMAL HIGH (ref 4.0–10.5)

## 2016-11-14 LAB — HEPATITIS A ANTIBODY, TOTAL: HEP A TOTAL AB: POSITIVE — AB

## 2016-11-14 LAB — CULTURE, BLOOD (ROUTINE X 2)
SPECIAL REQUESTS: ADEQUATE
Special Requests: ADEQUATE

## 2016-11-14 LAB — HEPATITIS B SURFACE ANTIGEN: HEP B S AG: NEGATIVE

## 2016-11-14 LAB — VANCOMYCIN, TROUGH: VANCOMYCIN TR: 9 ug/mL — AB (ref 15–20)

## 2016-11-14 SURGERY — ECHOCARDIOGRAM, TRANSESOPHAGEAL
Anesthesia: Monitor Anesthesia Care

## 2016-11-14 MED ORDER — PROPOFOL 10 MG/ML IV BOLUS
INTRAVENOUS | Status: DC | PRN
Start: 2016-11-14 — End: 2016-11-14
  Administered 2016-11-14 (×5): 10 mg via INTRAVENOUS

## 2016-11-14 MED ORDER — VANCOMYCIN HCL 10 G IV SOLR
1250.0000 mg | Freq: Four times a day (QID) | INTRAVENOUS | Status: DC
  Administered 2016-11-14 – 2016-11-15 (×5): 1250 mg via INTRAVENOUS
  Filled 2016-11-14 (×7): qty 1250

## 2016-11-14 MED ORDER — LACTATED RINGERS IV SOLN
INTRAVENOUS | Status: DC | PRN
  Administered 2016-11-14: 12:00:00 via INTRAVENOUS

## 2016-11-14 MED ORDER — PHENYLEPHRINE 40 MCG/ML (10ML) SYRINGE FOR IV PUSH (FOR BLOOD PRESSURE SUPPORT)
PREFILLED_SYRINGE | INTRAVENOUS | Status: DC | PRN
  Administered 2016-11-14 (×2): 40 ug via INTRAVENOUS

## 2016-11-14 MED ORDER — PROPOFOL 500 MG/50ML IV EMUL
INTRAVENOUS | Status: DC | PRN
  Administered 2016-11-14: 100 ug/kg/min via INTRAVENOUS

## 2016-11-14 MED ORDER — BUTAMBEN-TETRACAINE-BENZOCAINE 2-2-14 % EX AERO
INHALATION_SPRAY | CUTANEOUS | Status: DC | PRN
  Administered 2016-11-14: 2 via TOPICAL

## 2016-11-14 NOTE — Anesthesia Postprocedure Evaluation (Signed)
Anesthesia Post Note  Patient: Cody Rios  Procedure(s) Performed: Procedure(s) (LRB): TRANSESOPHAGEAL ECHOCARDIOGRAM (TEE) (N/A)     Patient location during evaluation: PACU Anesthesia Type: MAC Level of consciousness: awake and alert Pain management: pain level controlled Vital Signs Assessment: post-procedure vital signs reviewed and stable Respiratory status: spontaneous breathing, nonlabored ventilation, respiratory function stable and patient connected to nasal cannula oxygen Cardiovascular status: stable and blood pressure returned to baseline Anesthetic complications: no    Last Vitals:  Vitals:   11/14/16 1245 11/14/16 1300  BP: (!) 101/40 (!) 100/45  Pulse: 77 80  Resp: 15 18  Temp:  37 C  SpO2: 98% 98%    Last Pain:  Vitals:   11/14/16 1300  TempSrc: Oral  PainSc:                  Audry Pili

## 2016-11-14 NOTE — Consult Note (Signed)
ORTHOPAEDIC CONSULTATION  REQUESTING PHYSICIAN: Meredeth IdeLama, Gagan S, MD  PCP:  System, [name]  Chief Complaint: Right third metatarsal osteomyelitis  HPI: Cody Rios is a 26 y.o. male who complains of pain in the right foot. Pain along the dorsum of the foot in the midfoot as well as great toe pain. He has a history of IV drug abuse as well as narcotic abuse she was about 26 years old. Currently admitted for bacteremia possible endocarditis and findings on a recent MRI of his right foot concerning for third metatarsal osteomyelitis. He denies any ulcerations of the foot or drainage from the foot. He has no pain with weightbearing.  He denies any numbness tingling or otherwise in the right foot.  Past Medical History:  Diagnosis Date  . Endocarditis   . Hepatitis C   . Pneumonia 11/10/2016   Past Surgical History:  Procedure Laterality Date  . HERNIA REPAIR    . KNEE SURGERY     Social History   Social History  . Marital status: Single    Spouse name: N/A  . Number of children: N/A  . Years of education: N/A   Social History Main Topics  . Smoking status: Never Smoker  . Smokeless tobacco: Current User    Types: Chew  . Alcohol use No  . Drug use: Yes    Types: IV, Heroin     Comment: Last used: 22:00   . Sexual activity: Not Asked   Other Topics Concern  . None   Social History Narrative  . None   History reviewed. No pertinent family history. Allergies  Allergen Reactions  . Cephalosporins Hives  . Penicillins Hives, Shortness Of Breath and Rash   Prior to Admission medications   Medication Sig Start Date End Date Taking? Authorizing Provider  azithromycin (ZITHROMAX) 250 MG tablet Take first 2 tablets together, then 1 every day until finished. Patient not taking: Reported on 11/09/2016 11/04/16   [name], [name], MD  ibuprofen (ADVIL,MOTRIN) 800 MG tablet Take 1 tablet (800 mg total) by mouth every 8 (eight) hours as needed. Patient not taking: Reported  on 11/10/2016 11/08/16   [name], Christopher, PA-C  naproxen (NAPROSYN) 500 MG tablet Take 1 tablet (500 mg total) by mouth 2 (two) times daily. Patient not taking: Reported on 11/10/2016 11/04/16   [name], [name], MD   No results found.  Positive ROS: All other systems have been reviewed and were otherwise negative with the exception of those mentioned in the HPI and as above.  Physical Exam: General: Alert, no acute distress Cardiovascular: No pedal edema Respiratory: No cyanosis, no use of accessory musculature GI: No organomegaly, abdomen is soft and non-tender Skin: No lesions in the area of chief complaint Neurologic: Sensation intact distally Psychiatric: Patient is competent for consent with normal mood and affect Lymphatic: No axillary or cervical lymphadenopathy  MUSCULOSKELETAL:   Physical examination of right foot demonstrates no open wounds or overlying skin changes. He does have some tenderness to palpation in the P1 great toe as well as along the dorsal third metatarsal. There are no ulcerations. He has sensation intact to light touch in the deep and superficial as well as sural saphenous and tibial nerves. 2+ dorsalis pedis pulse. Motor is intact with dorsiflexion, plantar flexion, in inversion, eversion.  Assessment: Right third metatarsal osteomyelitis Great toe pain right foot  Plan: -There is no operative indication at this time. Recommend continue IV antibiotics for his osteomyelitis. - I reviewed the MRI  of the foot which does not show any superficial or deep space infections that need surgical drainage. He is okay to weight-bear as tolerated. - No orthopedic follow-up needed for this issue. - We will sign off.    Yolonda Kida, MD Cell [phone]    11/14/2016 12:35 PM

## 2016-11-14 NOTE — Transfer of Care (Signed)
Immediate Anesthesia Transfer of Care Note  Patient: Cody Rios  Procedure(s) Performed: Procedure(s): TRANSESOPHAGEAL ECHOCARDIOGRAM (TEE) (N/A)  Patient Location: Endoscopy Unit  Anesthesia Type:MAC  Level of Consciousness: awake, alert  and oriented  Airway & Oxygen Therapy: Patient Spontanous Breathing and Patient connected to nasal cannula oxygen  Post-op Assessment: Report given to RN, Post -op Vital signs reviewed and stable and Patient moving all extremities X 4  Post vital signs: Reviewed and stable  Last Vitals:  Vitals:   11/14/16 0636 11/14/16 1136  BP:  (!) 100/52  Pulse:  80  Resp:  12  Temp: 37.4 C 37.1 C  SpO2:  99%    Last Pain:  Vitals:   11/14/16 1136  TempSrc: Oral  PainSc:       Patients Stated Pain Goal: 0 (12/87/86 7672)  Complications: No apparent anesthesia complications

## 2016-11-14 NOTE — Progress Notes (Signed)
 Progress Note  Patient Name: Cody Rios Date of Encounter: 11/14/2016  Primary Cardiologist: Dr. Crenshaw  Subjective   Reports "hurting all over". Breathing at baseline. Has been NPO since midnight for TEE later today.   Inpatient Medications    Scheduled Meds: . buprenorphine-naloxone  1 tablet Sublingual BID  . cloNIDine  0.1 mg Oral BH-qamhs   Followed by  . [START ON 11/15/2016] cloNIDine  0.1 mg Oral QAC breakfast  . enoxaparin (LOVENOX) injection  40 mg Subcutaneous Q24H  . ketorolac  15 mg Intravenous Q6H  . pantoprazole  40 mg Oral Daily  . sodium chloride flush  3 mL Intravenous Q12H   Continuous Infusions: . vancomycin Stopped (11/13/16 2300)   PRN Meds: acetaminophen **OR** acetaminophen, dicyclomine, hydrOXYzine, loperamide, methocarbamol, ondansetron **OR** ondansetron (ZOFRAN) IV   Vital Signs    Vitals:   11/13/16 2124 11/13/16 2200 11/14/16 0623 11/14/16 0636  BP: (!) 109/59  107/67   Pulse: 98  (!) 117   Resp: 18  20   Temp: (!) 100.7 F (38.2 C) 99.7 F (37.6 C)  99.3 F (37.4 C)  TempSrc: Oral Oral  Oral  SpO2: 99%  99%   Weight:   159 lb 9.6 oz (72.4 kg)   Height:        Intake/Output Summary (Last 24 hours) at 11/14/16 0836 Last data filed at 11/14/16 0620  Gross per 24 hour  Intake                0 ml  Output              700 ml  Net             -700 ml   Filed Weights   11/12/16 0700 11/13/16 0523 11/14/16 0623  Weight: 160 lb 3.2 oz (72.7 kg) 160 lb 14.4 oz (73 kg) 159 lb 9.6 oz (72.4 kg)    Telemetry    SR with HR in 70's - 90's. Episodes of sinus tachycardia with HR peaking into the 130's. - Personally Reviewed  ECG    No new tracings.   Physical Exam   General: Well developed, young Caucasian male appearing in no acute distress. Head: Normocephalic, atraumatic.  Neck: Supple without bruits, JVD not elevated. Lungs:  Resp regular and unlabored, CTA without wheezing or rales. Heart: RRR, S1, S2, no S3, S4, or  murmur; no rub. Abdomen: Soft, non-tender, non-distended with normoactive bowel sounds. No hepatomegaly. No rebound/guarding. No obvious abdominal masses. Extremities: No clubbing, cyanosis, or lower extremity edema. Distal pedal pulses are 2+ bilaterally. Neuro: Alert and oriented X 3. Moves all extremities spontaneously. Psych: Normal affect.  Labs    Chemistry Recent Labs Lab 11/09/16 0257  11/11/16 0250 11/12/16 0545 11/14/16 0637  NA 135  < > 134* 136 133*  K 3.7  < > 3.4* 4.9 4.2  CL 101  < > 102 102 98*  CO2 28  < > 24 26 29  GLUCOSE 113*  < > 137* 91 112*  BUN 10  < > 8 17 12  CREATININE 0.64  < > 0.63 0.67 0.65  CALCIUM 8.2*  < > 7.8* 8.4* 8.2*  PROT 5.8*  --  5.3* 6.3*  --   ALBUMIN 2.7*  --  2.1* 2.3*  --   AST 44*  --  32 35  --   ALT 52  --  45 43  --   ALKPHOS 210*  --  217* 199*  --     BILITOT 1.2  --  1.1 1.4*  --   GFRNONAA >60  < > >60 >60 >60  GFRAA >60  < > >60 >60 >60  ANIONGAP 6  < > 8 8 6  < > = values in this interval not displayed.   Hematology Recent Labs Lab 11/12/16 0545 11/13/16 0324 11/14/16 0637  WBC 12.4* 11.8* 12.8*  RBC 3.74* 3.63* 3.51*  HGB 11.0* 10.1* 9.9*  HCT 31.1* 30.3* 29.2*  MCV 83.2 83.5 83.2  MCH 29.4 27.8 28.2  MCHC 35.4 33.3 33.9  RDW 15.5 15.3 15.0  PLT 371 422* 335    Cardiac EnzymesNo results for input(s): TROPONINI in the last 168 hours.  Recent Labs Lab 11/07/16 1835 11/09/16 0311 11/09/16 2244  TROPIPOC 0.00 0.00 0.00     BNP Recent Labs Lab 11/09/16 0258  BNP 139.6*     DDimer No results for input(s): DDIMER in the last 168 hours.   Radiology    No results found.  Cardiac Studies   Echocardiogram: 11/09/2016 Study Conclusions  - Left ventricle: The cavity size was mildly dilated. There was   mild focal basal hypertrophy of the septum. Systolic function was   normal. The estimated ejection fraction was in the range of 60%   to 65%. Wall motion was normal; there were no regional wall    motion abnormalities. Left ventricular diastolic function   parameters were normal. - Mitral valve: There was mild regurgitation. - Left atrium: The atrium was mildly dilated. - Right atrium: The atrium was severely dilated. - Tricuspid valve: Mobile density on the atrial side of the   tricuspid valve measuring 0.9 x 0.57cm in diameter and consistent   with vegetation. There was severe regurgitation. - Pulmonary arteries: PA peak pressure: 59 mm Hg (S). - Pericardium, extracardiac: A trivial pericardial effusion was   identified posterior to the heart.  Impressions:  - There was a vegetation, consistent with endocarditis. The right   ventricular systolic pressure was increased consistent with   moderate pulmonary hypertension.  Patient Profile     26 y.o. 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 Blood cultures are positive for methicillin sensitive staph aureus. Chest CT shows probable septic emboli and small pericardial effusion.   Assessment & Plan    1. Tricuspid valve endocarditis/septic emboli/MSSA Bacteremia -Transthoracic echocardiogram shows tricuspid valve vegetation with at least moderate tricuspid regurgitation. CTA on 8/9 showing bilateral subsegmental pulmonary emboli associated with cavitating pneumonias and consistent with history of septic emboli.  Blood cultures positive for MSSA and ID is following. Continue antibiotics. - he is scheduled for a TEE later today to further assess his endocarditis. Has been NPO since midnight. Risks and benefits of the procedure have been reviewed and he agrees to proceed. Endoscopy is aware of the need for anesthesia assistance.   2. Heroin abuse - has been started on Suboxone by admitting team.   3. Osteomyelitis - recent Ankle MRI showed findings suspicious for osteomyelitis of the proximal third metatarsal shaft. - ID recommended orthopedic  consult.   4. Chest pain - likely both musculoskeletal and related to septic emboli. Continue treatment with NSAIDS.    Signed, Andrian Urbach M Raiya Stainback , PA-C 8:36 AM 11/14/2016 Pager: [phone]  

## 2016-11-14 NOTE — Progress Notes (Signed)
Pharmacy Antibiotic Note  Cody Rios is a 26 y.o. male with MSSA bacteremia/TV endocarditis, R ankle osteo and infectious tenosynovotis. -WBC= 12.8, tmax= 100.7, CrCl > 100 -vancomycin trough= 9 on 1250mg  IV q8h  Plan: -Change vancomycin to 1250mg IV q6h (doubt he could reach goal of 15-20 on a q8hr regimen) -Will recheck a vancomycin level at steady state   Height: 6\' 2"  (188 cm) Weight: 159 lb 9.6 oz (72.4 kg) IBW/kg (Calculated) : 82.2  Temp (24hrs), Avg:99.7 F (37.6 C), Min:99 F (37.2 C), Max:100.7 F (38.2 C)   Recent Labs Lab 11/09/16 0257 11/09/16 0313 11/09/16 1030 11/09/16 1356 11/09/16 2214 11/10/16 1216 11/11/16 0250 11/12/16 0545 11/13/16 0324 11/14/16 0637  WBC 16.4*  --   --   --  17.6*  --  17.8* 12.4* 11.8* 12.8*  CREATININE 0.64  --   --   --  0.59*  --  0.63 0.67  --  0.65  LATICACIDVEN  --  0.88 1.3 2.1*  --  1.1  --   --   --   --   VANCOTROUGH  --   --   --   --   --   --   --  10*  --  9*    Estimated Creatinine Clearance: 142 mL/min (by C-G formula based on SCr of 0.65 mg/dL).    Allergies  Allergen Reactions  . Cephalosporins Hives  . Penicillins Hives, Shortness Of Breath and Rash    8/9 Azactam x 1 dose 8/9 vancomycin >>  8/11 VT 10 (on 1000mg  IV q8)- true trough 8/13 VT= 9 on 1250mg  IV q8  8/11 blood x 2- ngtd 8/10 blood x 2  S.Aureus in both *BCID MSSA) 8/9 urine- neg 8/8 HIV: non reactive  8/7: BCx: GPC/clusters 2/2; MSSA per BCID   Thank you for allowing pharmacy to be a part of this patient's care.  Cody Rios, Pharm D 11/14/2016 10:37 AM

## 2016-11-14 NOTE — Progress Notes (Signed)
  Date: 11/14/2016  Patient name: [name]  Medical record number: [id number]  Date of birth: [birth year]   I checked in on Cody Rios this afternoon. He is a 26 year old man with severe opioid use disorder admitted with MSSA endocarditis. I started him on Suboxone for medication assisted treatment of opioid withdrawal on Friday. He handled the induction very well and has responded nicely to the medication. Currently he is on Suboxone 16 mg daily and reports doing well with that dose. Denies any further withdrawal symptoms. Pain is well controlled. Denies any side effects like somnolence or constipation. I would recommend continuing Suboxone 16 mg daily indefinitely. Our hope is that in keeping him out of opioid withdrawal he will better engage with care and be less likely to leave before treatment is completed. I will check back in with the patient towards the end of the week as it doesn't look like he will be discharged anytime soon. Once he is ready for discharge, I am happy to help with that transition, which may include an outpatient prescription for Suboxone, [facility], or he may prefer to follow-up with [facility]. Please call me if you have any questions.   [name], Delmon Andrada Thomas, MD 11/14/2016, 2:53 PM  [phone]

## 2016-11-14 NOTE — H&P (View-Only) (Signed)
Progress Note  Patient Name: Cody Rios Date of Encounter: 11/14/2016  Primary Cardiologist: Dr. Jens Somrenshaw  Subjective   Reports "hurting all over". Breathing at baseline. Has been NPO since midnight for TEE later today.   Inpatient Medications    Scheduled Meds: . buprenorphine-naloxone  1 tablet Sublingual BID  . cloNIDine  0.1 mg Oral BH-qamhs   Followed by  . [START ON 11/15/2016] cloNIDine  0.1 mg Oral QAC breakfast  . enoxaparin (LOVENOX) injection  40 mg Subcutaneous Q24H  . ketorolac  15 mg Intravenous Q6H  . pantoprazole  40 mg Oral Daily  . sodium chloride flush  3 mL Intravenous Q12H   Continuous Infusions: . vancomycin Stopped (11/13/16 2300)   PRN Meds: acetaminophen **OR** acetaminophen, dicyclomine, hydrOXYzine, loperamide, methocarbamol, ondansetron **OR** ondansetron (ZOFRAN) IV   Vital Signs    Vitals:   11/13/16 2124 11/13/16 2200 11/14/16 0623 11/14/16 0636  BP: (!) 109/59  107/67   Pulse: 98  (!) 117   Resp: 18  20   Temp: (!) 100.7 F (38.2 C) 99.7 F (37.6 C)  99.3 F (37.4 C)  TempSrc: Oral Oral  Oral  SpO2: 99%  99%   Weight:   159 lb 9.6 oz (72.4 kg)   Height:        Intake/Output Summary (Last 24 hours) at 11/14/16 0836 Last data filed at 11/14/16 [id number]  Gross per 24 hour  Intake                0 ml  Output              700 ml  Net             -700 ml   Filed Weights   11/12/16 0700 11/13/16 0523 11/14/16 0623  Weight: 160 lb 3.2 oz (72.7 kg) 160 lb 14.4 oz (73 kg) 159 lb 9.6 oz (72.4 kg)    Telemetry    SR with HR in 70's - 90's. Episodes of sinus tachycardia with HR peaking into the 130's. - Personally Reviewed  ECG    No new tracings.   Physical Exam   General: Well developed, young Caucasian male appearing in no acute distress. Head: Normocephalic, atraumatic.  Neck: Supple without bruits, JVD not elevated. Lungs:  Resp regular and unlabored, CTA without wheezing or rales. Heart: RRR, S1, S2, no S3, S4, or  murmur; no rub. Abdomen: Soft, non-tender, non-distended with normoactive bowel sounds. No hepatomegaly. No rebound/guarding. No obvious abdominal masses. Extremities: No clubbing, cyanosis, or lower extremity edema. Distal pedal pulses are 2+ bilaterally. Neuro: Alert and oriented X 3. Moves all extremities spontaneously. Psych: Normal affect.  Labs    Chemistry Recent Labs Lab 11/09/16 0257  11/11/16 0250 11/12/16 0545 11/14/16 0637  NA 135  < > 134* 136 133*  K 3.7  < > 3.4* 4.9 4.2  CL 101  < > 102 102 98*  CO2 28  < > 24 26 29   GLUCOSE 113*  < > 137* 91 112*  BUN 10  < > 8 17 12   CREATININE 0.64  < > 0.63 0.67 0.65  CALCIUM 8.2*  < > 7.8* 8.4* 8.2*  PROT 5.8*  --  5.3* 6.3*  --   ALBUMIN 2.7*  --  2.1* 2.3*  --   AST 44*  --  32 35  --   ALT 52  --  45 43  --   ALKPHOS 210*  --  217* 199*  --  BILITOT 1.2  --  1.1 1.4*  --   GFRNONAA >60  < > >60 >60 >60  GFRAA >60  < > >60 >60 >60  ANIONGAP 6  < > 8 8 6   < > = values in this interval not displayed.   Hematology Recent Labs Lab 11/12/16 0545 11/13/16 0324 11/14/16 0637  WBC 12.4* 11.8* 12.8*  RBC 3.74* 3.63* 3.51*  HGB 11.0* 10.1* 9.9*  HCT 31.1* 30.3* 29.2*  MCV 83.2 83.5 83.2  MCH 29.4 27.8 28.2  MCHC 35.4 33.3 33.9  RDW 15.5 15.3 15.0  PLT 371 422* 335    Cardiac EnzymesNo results for input(s): TROPONINI in the last 168 hours.  Recent Labs Lab 11/07/16 1835 11/09/16 0311 11/09/16 2244  TROPIPOC 0.00 0.00 0.00     BNP Recent Labs Lab 11/09/16 0258  BNP 139.6*     DDimer No results for input(s): DDIMER in the last 168 hours.   Radiology    No results found.  Cardiac Studies   Echocardiogram: 11/09/2016 Study Conclusions  - Left ventricle: The cavity size was mildly dilated. There was   mild focal basal hypertrophy of the septum. Systolic function was   normal. The estimated ejection fraction was in the range of 60%   to 65%. Wall motion was normal; there were no regional wall    motion abnormalities. Left ventricular diastolic function   parameters were normal. - Mitral valve: There was mild regurgitation. - Left atrium: The atrium was mildly dilated. - Right atrium: The atrium was severely dilated. - Tricuspid valve: Mobile density on the atrial side of the   tricuspid valve measuring 0.9 x 0.57cm in diameter and consistent   with vegetation. There was severe regurgitation. - Pulmonary arteries: PA peak pressure: 59 mm Hg (S). - Pericardium, extracardiac: A trivial pericardial effusion was   identified posterior to the heart.  Impressions:  - There was a vegetation, consistent with endocarditis. The right   ventricular systolic pressure was increased consistent with   moderate pulmonary hypertension.  Patient Profile     26 y.o. 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 Blood cultures are positive for methicillin sensitive staph aureus. Chest CT shows probable septic emboli and small pericardial effusion.   Assessment & Plan    1. Tricuspid valve endocarditis/septic emboli/MSSA Bacteremia -Transthoracic echocardiogram shows tricuspid valve vegetation with at least moderate tricuspid regurgitation. CTA on 8/9 showing bilateral subsegmental pulmonary emboli associated with cavitating pneumonias and consistent with history of septic emboli.  Blood cultures positive for MSSA and ID is following. Continue antibiotics. - he is scheduled for a TEE later today to further assess his endocarditis. Has been NPO since midnight. Risks and benefits of the procedure have been reviewed and he agrees to proceed. Endoscopy is aware of the need for anesthesia assistance.   2. Heroin abuse - has been started on Suboxone by admitting team.   3. Osteomyelitis - recent Ankle MRI showed findings suspicious for osteomyelitis of the proximal third metatarsal shaft. - ID recommended orthopedic  consult.   4. Chest pain - likely both musculoskeletal and related to septic emboli. Continue treatment with NSAIDS.    Signed, Ellsworth Lennox , PA-C 8:36 AM 11/14/2016 Pager: [phone]

## 2016-11-14 NOTE — Anesthesia Procedure Notes (Signed)
Procedure Name: MAC Date/Time: 11/14/2016 12:05 PM Performed by: Garrison Columbus T Pre-anesthesia Checklist: Patient identified, Emergency Drugs available, Suction available and Patient being monitored Patient Re-evaluated:Patient Re-evaluated prior to induction Oxygen Delivery Method: Nasal cannula Preoxygenation: Pre-oxygenation with 100% oxygen Induction Type: IV induction Placement Confirmation: positive ETCO2 and breath sounds checked- equal and bilateral Dental Injury: Teeth and Oropharynx as per pre-operative assessment

## 2016-11-14 NOTE — Progress Notes (Signed)
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 The patient continues to have pain in his right 1st and 3rd digits.   -continue vancomycin -Orthopedic consult to evaluate right wrist and right foot -repeat blood culture to check for growth -Get TEE to further assess for intervention   Tenosynovitis of right wrist  MRI on 8/8 showed indication of tenosynovitis thought to be due to infection -Ortho consult to evaluate for need for possible surgical intervention -Continue vancomycin  Septic Pulmonary embolism CT angio from 8/9 shows presence of bilateral septic emboli -continue vancomycin   Right Knee Effusion Synovial fluid does not show any growth per gram stain and culture. Cell count and diff was not done.  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 buprenorphine-naloxone  1 tablet Sublingual BID  . [START ON 11/15/2016] cloNIDine  0.1 mg Oral QAC breakfast  . enoxaparin (LOVENOX) injection  40 mg Subcutaneous Q24H  . ketorolac  15 mg Intravenous Q6H  . pantoprazole  40 mg Oral Daily  . sodium chloride flush  3 mL Intravenous Q12H    SUBJECTIVE: Cody Rios was laying in his  bed this morning with his mother at bedside. He denied any sob or chest pain, but stated that he continues to have myalgias and sweating occasionally. He states that his right wrist pain and right knee pain has subsided.  Review of Systems: ROS as above  Allergies  Allergen Reactions  . Cephalosporins Hives  . Penicillins Hives, Shortness Of Breath and Rash    OBJECTIVE: Vitals:   11/13/16 2124 11/13/16 2200 11/14/16 0623 11/14/16 0636  BP: (!) 109/59  107/67   Pulse: 98  (!) 117   Resp: 18  20   Temp: (!) 100.7 F (38.2 C) 99.7 F (37.6 C)  99.3 F (37.4 C)  TempSrc: Oral Oral  Oral  SpO2: 99%  99%   Weight:   159 lb 9.6 oz (72.4 kg)   Height:       Body mass index is 20.49 kg/m.  Physical Exam  Constitutional: He appears lethargic. He has a sickly appearance. He appears distressed.  Cardiovascular: Normal rate, regular rhythm, normal heart sounds and intact distal pulses.   Pulmonary/Chest: Effort normal and breath sounds normal. No respiratory distress. He has no wheezes.  Abdominal: Soft. Bowel sounds are normal.  Musculoskeletal: He exhibits tenderness (right foot on first and 3rd digit. ).  Neurological: He appears lethargic.  Skin: No rash noted. No erythema.  Psychiatric: Mood, memory, affect and judgment normal.    Lab Results Lab Results  Component Value Date   WBC 12.8 (H) 11/14/2016   HGB 9.9 (  L) 11/14/2016   HCT 29.2 (L) 11/14/2016   MCV 83.2 11/14/2016   PLT 335 11/14/2016    Lab Results  Component Value Date   CREATININE 0.65 11/14/2016   BUN 12 11/14/2016   NA 133 (L) 11/14/2016   K 4.2 11/14/2016   CL 98 (L) 11/14/2016   CO2 29 11/14/2016    Lab Results  Component Value Date   ALT 43 11/12/2016   AST 35 11/12/2016   ALKPHOS 199 (H) 11/12/2016   BILITOT 1.4 (H) 11/12/2016     Microbiology: Recent Results (from the past 240 hour(s))  Culture, blood (routine x 2)     Status: Abnormal   Collection Time: 11/08/16 12:15 AM  Result Value  Ref Range Status   Specimen Description BLOOD RIGHT ARM  Final   Special Requests   Final    BOTTLES DRAWN AEROBIC AND ANAEROBIC Blood Culture adequate volume   Culture  Setup Time   Final    GRAM POSITIVE COCCI IN CLUSTERS IN BOTH AEROBIC AND ANAEROBIC BOTTLES CRITICAL RESULT CALLED TO, READ BACK BY AND VERIFIED WITH: A. HARTLEY RN 11/18/16 1835 BEAMJ    Culture (A)  Final    STAPHYLOCOCCUS AUREUS SUSCEPTIBILITIES PERFORMED ON PREVIOUS CULTURE WITHIN THE LAST 5 DAYS. Performed at [facility], [address], [place], [place] [address]    Report Status 11/10/2016 FINAL  Final  Culture, blood (routine x 2)     Status: Abnormal   Collection Time: 11/08/16 12:18 AM  Result Value Ref Range Status   Specimen Description BLOOD BLOOD RIGHT HAND  Final   Special Requests IN PEDIATRIC BOTTLE Blood Culture adequate volume  Final   Culture  Setup Time   Final    GRAM POSITIVE COCCI IN CLUSTERS IN PEDIATRIC BOTTLE CRITICAL VALUE NOTED.  VALUE IS CONSISTENT WITH PREVIOUSLY REPORTED AND CALLED VALUE. Performed at [facility], [address], [place], [place] [address]    Culture STAPHYLOCOCCUS AUREUS (A)  Final   Report Status 11/10/2016 FINAL  Final   Organism ID, Bacteria STAPHYLOCOCCUS AUREUS  Final      Susceptibility   Staphylococcus aureus - MIC*    CIPROFLOXACIN <=0.5 SENSITIVE Sensitive     ERYTHROMYCIN >=8 RESISTANT Resistant     GENTAMICIN <=0.5 SENSITIVE Sensitive     OXACILLIN 0.5 SENSITIVE Sensitive     TETRACYCLINE <=1 SENSITIVE Sensitive     VANCOMYCIN <=0.5 SENSITIVE Sensitive     TRIMETH/SULFA <=10 SENSITIVE Sensitive     CLINDAMYCIN RESISTANT Resistant     RIFAMPIN <=0.5 SENSITIVE Sensitive     Inducible Clindamycin POSITIVE Resistant     * STAPHYLOCOCCUS AUREUS  Blood Culture ID Panel (Reflexed)     Status: Abnormal   Collection Time: 11/08/16 12:18 AM  Result Value Ref Range Status   Enterococcus species NOT DETECTED NOT DETECTED Final   Listeria  monocytogenes NOT DETECTED NOT DETECTED Final   Staphylococcus species DETECTED (A) NOT DETECTED Final    Comment: CRITICAL RESULT CALLED TO, READ BACK BY AND VERIFIED WITH: A. HARTLEY RN 11/08/16 1835 BEAMJ    Staphylococcus aureus DETECTED (A) NOT DETECTED Final    Comment: Methicillin (oxacillin) susceptible Staphylococcus aureus (MSSA). Preferred therapy is anti staphylococcal beta lactam antibiotic (Cefazolin or Nafcillin), unless clinically contraindicated. CRITICAL RESULT CALLED TO, READ BACK BY AND VERIFIED WITH: A. HARTLEY RN 11/08/16 1835 BEAMJ    Methicillin resistance NOT DETECTED NOT DETECTED Final   Streptococcus species NOT DETECTED NOT DETECTED Final  Streptococcus agalactiae NOT DETECTED NOT DETECTED Final   Streptococcus pneumoniae NOT DETECTED NOT DETECTED Final   Streptococcus pyogenes NOT DETECTED NOT DETECTED Final   Acinetobacter baumannii NOT DETECTED NOT DETECTED Final   Enterobacteriaceae species NOT DETECTED NOT DETECTED Final   Enterobacter cloacae complex NOT DETECTED NOT DETECTED Final   Escherichia coli NOT DETECTED NOT DETECTED Final   Klebsiella oxytoca NOT DETECTED NOT DETECTED Final   Klebsiella pneumoniae NOT DETECTED NOT DETECTED Final   Proteus species NOT DETECTED NOT DETECTED Final   Serratia marcescens NOT DETECTED NOT DETECTED Final   Haemophilus influenzae NOT DETECTED NOT DETECTED Final   Neisseria meningitidis NOT DETECTED NOT DETECTED Final   Pseudomonas aeruginosa NOT DETECTED NOT DETECTED Final   Candida albicans NOT DETECTED NOT DETECTED Final   Candida glabrata NOT DETECTED NOT DETECTED Final   Candida krusei NOT DETECTED NOT DETECTED Final   Candida parapsilosis NOT DETECTED NOT DETECTED Final   Candida tropicalis NOT DETECTED NOT DETECTED Final    Comment: Performed at [facility], [address] [place], [place] [address]  Culture, blood (Routine X 2) w Reflex to ID Panel     Status: Abnormal   Collection Time: 11/09/16   2:58 AM  Result Value Ref Range Status   Specimen Description BLOOD LEFT ANTECUBITAL  Final   Special Requests   Final    BOTTLES DRAWN AEROBIC ONLY Blood Culture adequate volume   Culture  Setup Time   Final    GRAM POSITIVE COCCI IN CLUSTERS AEROBIC BOTTLE ONLY CRITICAL RESULT CALLED TO, READ BACK BY AND VERIFIED WITH: PHARMD M BELL [phone] MLM    Culture (A)  Final    STAPHYLOCOCCUS AUREUS SUSCEPTIBILITIES PERFORMED ON PREVIOUS CULTURE WITHIN THE LAST 5 DAYS. Performed at [facility], [address] [place], [place] [address]    Report Status 11/10/2016 FINAL  Final  Body fluid culture     Status: None   Collection Time: 11/09/16  4:12 AM  Result Value Ref Range Status   Specimen Description SYNOVIAL  Final   Special Requests NONE  Final   Gram Stain   Final    ABUNDANT WBC PRESENT,BOTH PMN AND MONONUCLEAR NO ORGANISMS SEEN    Culture   Final    NO GROWTH 3 DAYS INTERPRET RESULTS WITH CAUTION DUE TO LIMITED SPECIMEN VOLUME Performed at [facility], [address] [place], [place] [address]    Report Status 11/12/2016 FINAL  Final  Culture, blood (Routine X 2) w Reflex to ID Panel     Status: Abnormal   Collection Time: 11/09/16  7:44 AM  Result Value Ref Range Status   Specimen Description BLOOD RIGHT ARM  Final   Special Requests IN PEDIATRIC BOTTLE Blood Culture adequate volume  Final   Culture  Setup Time   Final    GRAM POSITIVE COCCI IN CLUSTERS IN PEDIATRIC BOTTLE CRITICAL RESULT CALLED TO, READ BACK BY AND VERIFIED WITH: G.ABBOTT, PHARMD 11/10/16 0017 L.CHAMPION    Culture (A)  Final    STAPHYLOCOCCUS AUREUS SUSCEPTIBILITIES PERFORMED ON PREVIOUS CULTURE WITHIN THE LAST 5 DAYS. Performed at [facility], [address] [place], [place] [address]    Report Status 11/11/2016 FINAL  Final  Urine Culture     Status: Abnormal   Collection Time: 11/10/16 10:52 AM  Result Value Ref Range Status   Specimen Description URINE, RANDOM  Final    Special Requests NONE  Final  Culture <10,000 COLONIES/mL INSIGNIFICANT GROWTH (A)  Final   Report Status 11/11/2016 FINAL  Final  Culture, blood (Routine X 2) w Reflex to ID Panel     Status: Abnormal   Collection Time: 11/11/16  6:44 PM  Result Value Ref Range Status   Specimen Description BLOOD LEFT HAND  Final   Special Requests   Final    BOTTLES DRAWN AEROBIC AND ANAEROBIC Blood Culture adequate volume   Culture  Setup Time   Final    GRAM POSITIVE COCCI IN CLUSTERS ANAEROBIC BOTTLE ONLY CRITICAL VALUE NOTED.  VALUE IS CONSISTENT WITH PREVIOUSLY REPORTED AND CALLED VALUE.    Culture (A)  Final    STAPHYLOCOCCUS AUREUS SUSCEPTIBILITIES PERFORMED ON PREVIOUS CULTURE WITHIN THE LAST 5 DAYS.    Report Status 11/14/2016 FINAL  Final  Culture, blood (Routine X 2) w Reflex to ID Panel     Status: Abnormal   Collection Time: 11/11/16  6:44 PM  Result Value Ref Range Status   Specimen Description BLOOD LEFT ARM  Final   Special Requests IN PEDIATRIC BOTTLE Blood Culture adequate volume  Final   Culture  Setup Time   Final    GRAM POSITIVE COCCI IN CLUSTERS IN PEDIATRIC BOTTLE Organism ID to follow CRITICAL RESULT CALLED TO, READ BACK BY AND VERIFIED WITH: H BAIRD 11/12/16 @ 1326 M VESTAL    Culture STAPHYLOCOCCUS AUREUS (A)  Final   Report Status 11/14/2016 FINAL  Final   Organism ID, Bacteria STAPHYLOCOCCUS AUREUS  Final      Susceptibility   Staphylococcus aureus - MIC*    CIPROFLOXACIN <=0.5 SENSITIVE Sensitive     ERYTHROMYCIN >=8 RESISTANT Resistant     GENTAMICIN <=0.5 SENSITIVE Sensitive     OXACILLIN 0.5 SENSITIVE Sensitive     TETRACYCLINE <=1 SENSITIVE Sensitive     VANCOMYCIN <=0.5 SENSITIVE Sensitive     TRIMETH/SULFA <=10 SENSITIVE Sensitive     CLINDAMYCIN RESISTANT Resistant     RIFAMPIN <=0.5 SENSITIVE Sensitive     Inducible Clindamycin POSITIVE Resistant     * STAPHYLOCOCCUS AUREUS  Blood Culture ID Panel (Reflexed)     Status: Abnormal   Collection Time:  11/11/16  6:44 PM  Result Value Ref Range Status   Enterococcus species NOT DETECTED NOT DETECTED Final   Vancomycin resistance NOT DETECTED NOT DETECTED Final   Listeria monocytogenes NOT DETECTED NOT DETECTED Final   Staphylococcus species DETECTED (A) NOT DETECTED Final    Comment: CRITICAL RESULT CALLED TO, READ BACK BY AND VERIFIED WITH: H BAIRD 11/12/16 @ 1326 M VESTAL    Staphylococcus aureus DETECTED (A) NOT DETECTED Final    Comment: CRITICAL RESULT CALLED TO, READ BACK BY AND VERIFIED WITH: H BAIRD 11/12/16 @ 1326 M VESTAL    Methicillin resistance NOT DETECTED NOT DETECTED Final   Streptococcus species NOT DETECTED NOT DETECTED Final   Streptococcus agalactiae NOT DETECTED NOT DETECTED Final   Streptococcus pneumoniae NOT DETECTED NOT DETECTED Final   Streptococcus pyogenes NOT DETECTED NOT DETECTED Final   Acinetobacter baumannii NOT DETECTED NOT DETECTED Final   Enterobacteriaceae species NOT DETECTED NOT DETECTED Final   Enterobacter cloacae complex NOT DETECTED NOT DETECTED Final   Escherichia coli NOT DETECTED NOT DETECTED Final   Klebsiella oxytoca NOT DETECTED NOT DETECTED Final   Klebsiella pneumoniae NOT DETECTED NOT DETECTED Final   Proteus species NOT DETECTED NOT DETECTED Final   Serratia marcescens NOT DETECTED NOT DETECTED Final   Carbapenem resistance NOT DETECTED NOT DETECTED Final  Haemophilus influenzae NOT DETECTED NOT DETECTED Final   Neisseria meningitidis NOT DETECTED NOT DETECTED Final   Pseudomonas aeruginosa NOT DETECTED NOT DETECTED Final   Candida albicans NOT DETECTED NOT DETECTED Final   Candida glabrata NOT DETECTED NOT DETECTED Final   Candida krusei NOT DETECTED NOT DETECTED Final   Candida parapsilosis NOT DETECTED NOT DETECTED Final   Candida tropicalis NOT DETECTED NOT DETECTED Final  Culture, blood (Routine X 2) w Reflex to ID Panel     Status: None (Preliminary result)   Collection Time: 11/12/16  4:30 PM  Result Value Ref Range  Status   Specimen Description BLOOD LEFT ARM  Final   Special Requests   Final    BOTTLES DRAWN AEROBIC AND ANAEROBIC Blood Culture results may not be optimal due to an excessive volume of blood received in culture bottles   Culture NO GROWTH < 24 HOURS  Final   Report Status PENDING  Incomplete  Culture, blood (Routine X 2) w Reflex to ID Panel     Status: None (Preliminary result)   Collection Time: 11/12/16  4:42 PM  Result Value Ref Range Status   Specimen Description BLOOD LEFT WRIST  Final   Special Requests   Final    BOTTLES DRAWN AEROBIC AND ANAEROBIC Blood Culture results may not be optimal due to an excessive volume of blood received in culture bottles   Culture NO GROWTH < 24 HOURS  Final   Report Status PENDING  Incomplete    Lorenso Courier, MD [facility] [phone] pager   [phone] cell 11/14/2016, 10:37 AM

## 2016-11-14 NOTE — CV Procedure (Signed)
    Transesophageal Echocardiogram Note  Ardath SaxDustin A Taras [id number] 1991/03/10  Procedure: Transesophageal Echocardiogram Indications: Tricuspid endocarditis   Procedure Details Consent: Obtained Time Out: Verified patient identification, verified procedure, site/side was marked, verified correct patient position, special equipment/implants available, Radiology Safety Procedures followed,  medications/allergies/relevent history reviewed, required imaging and test results available.  Performed  Medications:  During this procedure the patient is administered  Propofol drip 172 mg by Anesthesia   Left Ventrical:  Normal LV function   Mitral Valve: normal   Aortic Valve: normal   Tricuspid Valve: multiple vegetations on the TV.   At least moderate TR   Pulmonic Valve: normal   Left Atrium/ Left atrial appendage: no thrombi   Atrial septum: intact by color flow   Aorta: normal    Complications: No apparent complications Patient did tolerate procedure well.   Vesta MixerPhilip J. Okley Magnussen, Montez HagemanJr., MD, [facility] 11/14/2016, 12:27 PM

## 2016-11-14 NOTE — Progress Notes (Signed)
[facility]  PROGRESS NOTE  Cody Rios ZOX:[id number] DOB: June 10, 1990 DOA: 11/10/2016 PCP: System, [name]   Brief HPI:    26 y.o. male who presents with IVDA w/ known MSSA bacteremia, endocarditis w/ multifocal pneumonia and septic pulmonary emboli. Of note pt left [facility] hospital the day prior AMA. Patient was called at home for MSSA  bacteremia and he came back to hospital.   Subjective   Patient seen and examined, complains of mild generalized pain. Plan for TEE today   Assessment/Plan:     1. Tricuspid valve endocarditis/septic pulmonary emboli- CT chest shows bilateral multifocal pneumonia likely septic emboli from right side endocarditis. TTE shows vegetation on tricuspid valve with significant tricuspid regurgitation and high PA pressure. Blood cultures x 2  positive for MSSA. Patient is currently on vancomycin. ID and Cardiology  are following. Plan for TEE today. 2. MSSA bacteremia- blood cultures on 11/09/2016 showed MSSA bacteremia in 2 out of 2 bottles. Patient currently on vancomycin per pharmacy. ID following. 3. Right ankle osteomyelitis- MRI of the right ankle shows osteomyelitis, continue antibiotics as above. We'll consult orthopedics for further recommendations. 4. Left knee pain- synovial fluid aspirated by orthopedics on 8/8 showed no growth. Continue Toradol 15 mg IV every 6 hours 5. 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 He has been started on Suboxone by internal medicine attending [name]. Will continue with Suboxone,Bentyl, Catapres tapering dose to help with withdrawal symptoms. No withdrawal l symptoms at this time. 6. Tenosynovitis of right wrist- tenosynovitis likely infectious confirmed on MRI 11/09/2016. Continue antibiotics as above. 7. History of juvenile rheumatoid arthritis- patient has been taking sulfasalazine for 2-3 years but stopped because he didn't think it was  helping. 8. Anemia- hemoglobin is stable at 11.0, likely due to malnutrition in setting of IV drug abuse. Iron level 42, saturation 16%, ferritin 276, B12 1379, folate 18.2.    DVT prophylaxis: Lovenox  Code Status: Full code  Family Communication: No family present at bedside  Disposition Plan: Pending improvement of multiple medical problems   Consultants:  Infectious disease   Procedures:  None  Continuous infusions . vancomycin Stopped (11/14/16 1155)      Antibiotics:   Anti-infectives    Start     Dose/Rate Route Frequency Ordered Stop   11/14/16 1000  vancomycin (VANCOCIN) 1,250 mg in sodium chloride 0.9 % 250 mL IVPB     1,250 mg 166.7 mL/hr over 90 Minutes Intravenous Every 6 hours 11/14/16 0914     11/12/16 2200  vancomycin (VANCOCIN) 1,500 mg in sodium chloride 0.9 % 500 mL IVPB  Status:  Discontinued     1,500 mg 250 mL/hr over 120 Minutes Intravenous Every 8 hours 11/12/16 1255 11/12/16 1346   11/12/16 2200  vancomycin (VANCOCIN) 1,250 mg in sodium chloride 0.9 % 250 mL IVPB  Status:  Discontinued     1,250 mg 166.7 mL/hr over 90 Minutes Intravenous Every 8 hours 11/12/16 1346 11/14/16 0914   11/12/16 1300  vancomycin (VANCOCIN) 1,750 mg in sodium chloride 0.9 % 500 mL IVPB     1,750 mg 250 mL/hr over 120 Minutes Intravenous NOW 11/12/16 1255 11/12/16 1533   11/10/16 1400  aztreonam (AZACTAM) 2 g in dextrose 5 % 50 mL IVPB  Status:  Discontinued     2 g 100 mL/hr over 30 Minutes Intravenous Every 8 hours 11/10/16 0740 11/10/16 0912   11/10/16 1400  vancomycin (VANCOCIN)  IVPB 1000 mg/200 mL premix  Status:  Discontinued     1,000 mg 200 mL/hr over 60 Minutes Intravenous Every 8 hours 11/10/16 0740 11/12/16 1255   11/10/16 0730  aztreonam (AZACTAM) 2 g in dextrose 5 % 50 mL IVPB     2 g 100 mL/hr over 30 Minutes Intravenous  Once 11/10/16 0720 11/10/16 0930   11/10/16 0730  vancomycin (VANCOCIN) IVPB 1000 mg/200 mL premix  Status:  Discontinued      1,000 mg 200 mL/hr over 60 Minutes Intravenous  Once 11/10/16 0720 11/10/16 0726   11/10/16 0030  vancomycin (VANCOCIN) IVPB 1000 mg/200 mL premix     1,000 mg 200 mL/hr over 60 Minutes Intravenous  Once 11/10/16 0027 11/10/16 0756       Objective   Vitals:   11/14/16 1235 11/14/16 1240 11/14/16 1245 11/14/16 1300  BP: (!) 88/40 (!) 92/43 (!) 101/40 (!) 100/45  Pulse: 80 80 77 80  Resp: 15 12 15 18   Temp: 99.6 F (37.6 C)   98.6 F (37 C)  TempSrc: Oral   Oral  SpO2: 98% 98% 98% 98%  Weight:      Height:        Intake/Output Summary (Last 24 hours) at 11/14/16 1327 Last data filed at 11/14/16 1227  Gross per 24 hour  Intake              300 ml  Output              700 ml  Net             -400 ml   Filed Weights   11/13/16 0523 11/14/16 0623 11/14/16 1136  Weight: 73 kg (160 lb 14.4 oz) 72.4 kg (159 lb 9.6 oz) 72.1 kg (159 lb)     Physical Examination:   Physical Exam: Eyes: No icterus, extraocular muscles intact  Mouth: Oral mucosa is moist, no lesions on palate,  Neck: Supple, no deformities, masses, or tenderness Lungs: Normal respiratory effort, bilateral clear to auscultation, no crackles or wheezes.  Heart: Regular rate and rhythm, S1 and S2 normal, no murmurs, rubs auscultated Abdomen: BS normoactive,soft,nondistended,non-tender to palpation,no organomegaly Extremities: No pretibial edema, no erythema, no cyanosis, no clubbing Neuro : Alert and oriented to time, place and person, No focal deficits Skin: No rashes seen on exam    Data Reviewed: I have personally reviewed following labs and imaging studies  CBG: No results for input(s): GLUCAP in the last 168 hours.  CBC:  Recent Labs Lab 11/09/16 0257 11/09/16 2214 11/11/16 0250 11/12/16 0545 11/13/16 0324 11/14/16 0637  WBC 16.4* 17.6* 17.8* 12.4* 11.8* 12.8*  NEUTROABS 12.0*  --   --   --   --   --   HGB 9.9* 9.9* 9.0* 11.0* 10.1* 9.9*  HCT 27.9* 29.1* 25.9* 31.1* 30.3* 29.2*  MCV 83.0  82.7 80.7 83.2 83.5 83.2  PLT 189 241 310 371 422* 335    Basic Metabolic Panel:  Recent Labs Lab 11/09/16 0257 11/09/16 2214 11/10/16 1804 11/11/16 0250 11/12/16 0545 11/14/16 0637  NA 135 133*  --  134* 136 133*  K 3.7 4.0  --  3.4* 4.9 4.2  CL 101 101  --  102 102 98*  CO2 28 25  --  24 26 29   GLUCOSE 113* 115*  --  137* 91 112*  BUN 10 6  --  8 17 12   CREATININE 0.64 0.59*  --  0.63 0.67 0.65  CALCIUM 8.2*  8.0*  --  7.8* 8.4* 8.2*  MG  --   --  1.9  --   --   --     Recent Results (from the past 240 hour(s))  Culture, blood (routine x 2)     Status: Abnormal   Collection Time: 11/08/16 12:15 AM  Result Value Ref Range Status   Specimen Description BLOOD RIGHT ARM  Final   Special Requests   Final    BOTTLES DRAWN AEROBIC AND ANAEROBIC Blood Culture adequate volume   Culture  Setup Time   Final    GRAM POSITIVE COCCI IN CLUSTERS IN BOTH AEROBIC AND ANAEROBIC BOTTLES CRITICAL RESULT CALLED TO, READ BACK BY AND VERIFIED WITH: A. HARTLEY RN 11/18/16 1835 BEAMJ    Culture (A)  Final    STAPHYLOCOCCUS AUREUS SUSCEPTIBILITIES PERFORMED ON PREVIOUS CULTURE WITHIN THE LAST 5 DAYS. Performed at [facility], [address] [place], [place] [address]    Report Status 11/10/2016 FINAL  Final  Culture, blood (routine x 2)     Status: Abnormal   Collection Time: 11/08/16 12:18 AM  Result Value Ref Range Status   Specimen Description BLOOD BLOOD RIGHT HAND  Final   Special Requests IN PEDIATRIC BOTTLE Blood Culture adequate volume  Final   Culture  Setup Time   Final    GRAM POSITIVE COCCI IN CLUSTERS IN PEDIATRIC BOTTLE CRITICAL VALUE NOTED.  VALUE IS CONSISTENT WITH PREVIOUSLY REPORTED AND CALLED VALUE. Performed at [facility], [address] [place], [place] [address]    Culture STAPHYLOCOCCUS AUREUS (A)  Final   Report Status 11/10/2016 FINAL  Final   Organism ID, Bacteria STAPHYLOCOCCUS AUREUS  Final      Susceptibility   Staphylococcus aureus -  MIC*    CIPROFLOXACIN <=0.5 SENSITIVE Sensitive     ERYTHROMYCIN >=8 RESISTANT Resistant     GENTAMICIN <=0.5 SENSITIVE Sensitive     OXACILLIN 0.5 SENSITIVE Sensitive     TETRACYCLINE <=1 SENSITIVE Sensitive     VANCOMYCIN <=0.5 SENSITIVE Sensitive     TRIMETH/SULFA <=10 SENSITIVE Sensitive     CLINDAMYCIN RESISTANT Resistant     RIFAMPIN <=0.5 SENSITIVE Sensitive     Inducible Clindamycin POSITIVE Resistant     * STAPHYLOCOCCUS AUREUS  Blood Culture ID Panel (Reflexed)     Status: Abnormal   Collection Time: 11/08/16 12:18 AM  Result Value Ref Range Status   Enterococcus species NOT DETECTED NOT DETECTED Final   Listeria monocytogenes NOT DETECTED NOT DETECTED Final   Staphylococcus species DETECTED (A) NOT DETECTED Final    Comment: CRITICAL RESULT CALLED TO, READ BACK BY AND VERIFIED WITH: A. HARTLEY RN 11/08/16 1835 BEAMJ    Staphylococcus aureus DETECTED (A) NOT DETECTED Final    Comment: Methicillin (oxacillin) susceptible Staphylococcus aureus (MSSA). Preferred therapy is anti staphylococcal beta lactam antibiotic (Cefazolin or Nafcillin), unless clinically contraindicated. CRITICAL RESULT CALLED TO, READ BACK BY AND VERIFIED WITH: A. HARTLEY RN 11/08/16 1835 BEAMJ    Methicillin resistance NOT DETECTED NOT DETECTED Final   Streptococcus species NOT DETECTED NOT DETECTED Final   Streptococcus agalactiae NOT DETECTED NOT DETECTED Final   Streptococcus pneumoniae NOT DETECTED NOT DETECTED Final   Streptococcus pyogenes NOT DETECTED NOT DETECTED Final   Acinetobacter baumannii NOT DETECTED NOT DETECTED Final   Enterobacteriaceae species NOT DETECTED NOT DETECTED Final   Enterobacter cloacae complex NOT DETECTED NOT DETECTED Final   Escherichia coli NOT DETECTED NOT DETECTED Final   Klebsiella oxytoca NOT DETECTED  NOT DETECTED Final   Klebsiella pneumoniae NOT DETECTED NOT DETECTED Final   Proteus species NOT DETECTED NOT DETECTED Final   Serratia marcescens NOT DETECTED NOT  DETECTED Final   Haemophilus influenzae NOT DETECTED NOT DETECTED Final   Neisseria meningitidis NOT DETECTED NOT DETECTED Final   Pseudomonas aeruginosa NOT DETECTED NOT DETECTED Final   Candida albicans NOT DETECTED NOT DETECTED Final   Candida glabrata NOT DETECTED NOT DETECTED Final   Candida krusei NOT DETECTED NOT DETECTED Final   Candida parapsilosis NOT DETECTED NOT DETECTED Final   Candida tropicalis NOT DETECTED NOT DETECTED Final    Comment: Performed at [facility], [address], [place], [place] [address]  Culture, blood (Routine X 2) w Reflex to ID Panel     Status: Abnormal   Collection Time: 11/09/16  2:58 AM  Result Value Ref Range Status   Specimen Description BLOOD LEFT ANTECUBITAL  Final   Special Requests   Final    BOTTLES DRAWN AEROBIC ONLY Blood Culture adequate volume   Culture  Setup Time   Final    GRAM POSITIVE COCCI IN CLUSTERS AEROBIC BOTTLE ONLY CRITICAL RESULT CALLED TO, READ BACK BY AND VERIFIED WITH: PHARMD M BELL [id number] MLM    Culture (A)  Final    STAPHYLOCOCCUS AUREUS SUSCEPTIBILITIES PERFORMED ON PREVIOUS CULTURE WITHIN THE LAST 5 DAYS. Performed at [facility], [address] [place], [place] [address]    Report Status 11/10/2016 FINAL  Final  Body fluid culture     Status: None   Collection Time: 11/09/16  4:12 AM  Result Value Ref Range Status   Specimen Description SYNOVIAL  Final   Special Requests NONE  Final   Gram Stain   Final    ABUNDANT WBC PRESENT,BOTH PMN AND MONONUCLEAR NO ORGANISMS SEEN    Culture   Final    NO GROWTH 3 DAYS INTERPRET RESULTS WITH CAUTION DUE TO LIMITED SPECIMEN VOLUME Performed at [facility], [address] [place], [place] [address]    Report Status 11/12/2016 FINAL  Final  Culture, blood (Routine X 2) w Reflex to ID Panel     Status: Abnormal   Collection Time: 11/09/16  7:44 AM  Result Value Ref Range Status   Specimen Description BLOOD RIGHT ARM  Final   Special  Requests IN PEDIATRIC BOTTLE Blood Culture adequate volume  Final   Culture  Setup Time   Final    GRAM POSITIVE COCCI IN CLUSTERS IN PEDIATRIC BOTTLE CRITICAL RESULT CALLED TO, READ BACK BY AND VERIFIED WITH: G.ABBOTT, PHARMD 11/10/16 0017 L.CHAMPION    Culture (A)  Final    STAPHYLOCOCCUS AUREUS SUSCEPTIBILITIES PERFORMED ON PREVIOUS CULTURE WITHIN THE LAST 5 DAYS. Performed at [facility], [address], [place], [place] [address]    Report Status 11/11/2016 FINAL  Final  Urine Culture     Status: Abnormal   Collection Time: 11/10/16 10:52 AM  Result Value Ref Range Status   Specimen Description URINE, RANDOM  Final   Special Requests NONE  Final   Culture <10,000 COLONIES/mL INSIGNIFICANT GROWTH (A)  Final   Report Status 11/11/2016 FINAL  Final  Culture, blood (Routine X 2) w Reflex to ID Panel     Status: Abnormal   Collection Time: 11/11/16  6:44 PM  Result Value Ref Range Status   Specimen Description BLOOD LEFT HAND  Final   Special Requests   Final    BOTTLES DRAWN  AEROBIC AND ANAEROBIC Blood Culture adequate volume   Culture  Setup Time   Final    GRAM POSITIVE COCCI IN CLUSTERS ANAEROBIC BOTTLE ONLY CRITICAL VALUE NOTED.  VALUE IS CONSISTENT WITH PREVIOUSLY REPORTED AND CALLED VALUE.    Culture (A)  Final    STAPHYLOCOCCUS AUREUS SUSCEPTIBILITIES PERFORMED ON PREVIOUS CULTURE WITHIN THE LAST 5 DAYS.    Report Status 11/14/2016 FINAL  Final  Culture, blood (Routine X 2) w Reflex to ID Panel     Status: Abnormal   Collection Time: 11/11/16  6:44 PM  Result Value Ref Range Status   Specimen Description BLOOD LEFT ARM  Final   Special Requests IN PEDIATRIC BOTTLE Blood Culture adequate volume  Final   Culture  Setup Time   Final    GRAM POSITIVE COCCI IN CLUSTERS IN PEDIATRIC BOTTLE Organism ID to follow CRITICAL RESULT CALLED TO, READ BACK BY AND VERIFIED WITH: H BAIRD 11/12/16 @ 1326 M VESTAL    Culture STAPHYLOCOCCUS AUREUS (A)  Final   Report Status  11/14/2016 FINAL  Final   Organism ID, Bacteria STAPHYLOCOCCUS AUREUS  Final      Susceptibility   Staphylococcus aureus - MIC*    CIPROFLOXACIN <=0.5 SENSITIVE Sensitive     ERYTHROMYCIN >=8 RESISTANT Resistant     GENTAMICIN <=0.5 SENSITIVE Sensitive     OXACILLIN 0.5 SENSITIVE Sensitive     TETRACYCLINE <=1 SENSITIVE Sensitive     VANCOMYCIN <=0.5 SENSITIVE Sensitive     TRIMETH/SULFA <=10 SENSITIVE Sensitive     CLINDAMYCIN RESISTANT Resistant     RIFAMPIN <=0.5 SENSITIVE Sensitive     Inducible Clindamycin POSITIVE Resistant     * STAPHYLOCOCCUS AUREUS  Blood Culture ID Panel (Reflexed)     Status: Abnormal   Collection Time: 11/11/16  6:44 PM  Result Value Ref Range Status   Enterococcus species NOT DETECTED NOT DETECTED Final   Vancomycin resistance NOT DETECTED NOT DETECTED Final   Listeria monocytogenes NOT DETECTED NOT DETECTED Final   Staphylococcus species DETECTED (A) NOT DETECTED Final    Comment: CRITICAL RESULT CALLED TO, READ BACK BY AND VERIFIED WITH: H BAIRD 11/12/16 @ 1326 M VESTAL    Staphylococcus aureus DETECTED (A) NOT DETECTED Final    Comment: CRITICAL RESULT CALLED TO, READ BACK BY AND VERIFIED WITH: H BAIRD 11/12/16 @ 1326 M VESTAL    Methicillin resistance NOT DETECTED NOT DETECTED Final   Streptococcus species NOT DETECTED NOT DETECTED Final   Streptococcus agalactiae NOT DETECTED NOT DETECTED Final   Streptococcus pneumoniae NOT DETECTED NOT DETECTED Final   Streptococcus pyogenes NOT DETECTED NOT DETECTED Final   Acinetobacter baumannii NOT DETECTED NOT DETECTED Final   Enterobacteriaceae species NOT DETECTED NOT DETECTED Final   Enterobacter cloacae complex NOT DETECTED NOT DETECTED Final   Escherichia coli NOT DETECTED NOT DETECTED Final   Klebsiella oxytoca NOT DETECTED NOT DETECTED Final   Klebsiella pneumoniae NOT DETECTED NOT DETECTED Final   Proteus species NOT DETECTED NOT DETECTED Final   Serratia marcescens NOT DETECTED NOT DETECTED  Final   Carbapenem resistance NOT DETECTED NOT DETECTED Final   Haemophilus influenzae NOT DETECTED NOT DETECTED Final   Neisseria meningitidis NOT DETECTED NOT DETECTED Final   Pseudomonas aeruginosa NOT DETECTED NOT DETECTED Final   Candida albicans NOT DETECTED NOT DETECTED Final   Candida glabrata NOT DETECTED NOT DETECTED Final   Candida krusei NOT DETECTED NOT DETECTED Final   Candida parapsilosis NOT DETECTED NOT DETECTED Final   Candida tropicalis NOT  DETECTED NOT DETECTED Final  Culture, blood (Routine X 2) w Reflex to ID Panel     Status: None (Preliminary result)   Collection Time: 11/12/16  4:30 PM  Result Value Ref Range Status   Specimen Description BLOOD LEFT ARM  Final   Special Requests   Final    BOTTLES DRAWN AEROBIC AND ANAEROBIC Blood Culture results may not be optimal due to an excessive volume of blood received in culture bottles   Culture NO GROWTH < 24 HOURS  Final   Report Status PENDING  Incomplete  Culture, blood (Routine X 2) w Reflex to ID Panel     Status: None (Preliminary result)   Collection Time: 11/12/16  4:42 PM  Result Value Ref Range Status   Specimen Description BLOOD LEFT WRIST  Final   Special Requests   Final    BOTTLES DRAWN AEROBIC AND ANAEROBIC Blood Culture results may not be optimal due to an excessive volume of blood received in culture bottles   Culture NO GROWTH < 24 HOURS  Final   Report Status PENDING  Incomplete     Liver Function Tests:  Recent Labs Lab 11/09/16 0257 11/11/16 0250 11/12/16 0545  AST 44* 32 35  ALT 52 45 43  ALKPHOS 210* 217* 199*  BILITOT 1.2 1.1 1.4*  PROT 5.8* 5.3* 6.3*  ALBUMIN 2.7* 2.1* 2.3*   No results for input(s): LIPASE, AMYLASE in the last 168 hours. No results for input(s): AMMONIA in the last 168 hours.  Cardiac Enzymes: No results for input(s): CKTOTAL, CKMB, CKMBINDEX, TROPONINI in the last 168 hours. BNP (last 3 results)  Recent Labs  11/09/16 0258  BNP 139.6*    ProBNP (last  3 results) No results for input(s): PROBNP in the last 8760 hours.    Studies: No results found.  Scheduled Meds: . buprenorphine-naloxone  1 tablet Sublingual BID  . [START ON 11/15/2016] cloNIDine  0.1 mg Oral QAC breakfast  . enoxaparin (LOVENOX) injection  40 mg Subcutaneous Q24H  . ketorolac  15 mg Intravenous Q6H  . pantoprazole  40 mg Oral Daily  . sodium chloride flush  3 mL Intravenous Q12H      Time spent: 20 min  [facility]   [facility] Pager [phone]. If 7PM-7AM, please contact night-coverage at [email or web address], Office  [phone]  password [email or web address]  11/14/2016, 1:27 PM  LOS: 4 days

## 2016-11-14 NOTE — Anesthesia Preprocedure Evaluation (Addendum)
Anesthesia Evaluation  Patient identified by MRN, date of birth, ID band Patient awake    Reviewed: Allergy & Precautions, NPO status , Patient's Chart, lab work & pertinent test results  Airway Mallampati: II  TM Distance: >3 FB Neck ROM: Full    Dental no notable dental hx. (+) Dental Advisory Given   Pulmonary pneumonia, unresolved,    Pulmonary exam normal breath sounds clear to auscultation       Cardiovascular Normal cardiovascular exam Rhythm:Regular Rate:Normal  Endocarditis   Neuro/Psych negative neurological ROS  negative psych ROS   GI/Hepatic negative GI ROS, (+) Hepatitis -, C  Endo/Other  negative endocrine ROS  Renal/GU negative Renal ROS  negative genitourinary   Musculoskeletal negative musculoskeletal ROS (+)   Abdominal   Peds  Hematology  (+) anemia ,   Anesthesia Other Findings IV Heroin abuse  Reproductive/Obstetrics                            Anesthesia Physical Anesthesia Plan  ASA: II  Anesthesia Plan: MAC   Post-op Pain Management:    Induction: Intravenous  PONV Risk Score and Plan: Treatment may vary due to age or medical condition  Airway Management Planned: Nasal Cannula  Additional Equipment:   Intra-op Plan:   Post-operative Plan:   Informed Consent: I have reviewed the patients History and Physical, chart, labs and discussed the procedure including the risks, benefits and alternatives for the proposed anesthesia with the patient or authorized representative who has indicated his/her understanding and acceptance.   Dental advisory given  Plan Discussed with: CRNA  Anesthesia Plan Comments:         Anesthesia Quick Evaluation

## 2016-11-14 NOTE — Interval H&P Note (Signed)
History and Physical Interval Note:  11/14/2016 11:50 AM  Cody Rios  has presented today for surgery, with the diagnosis of BACTEREMIA  The various methods of treatment have been discussed with the patient and family. After consideration of risks, benefits and other options for treatment, the patient has consented to  Procedure(s): TRANSESOPHAGEAL ECHOCARDIOGRAM (TEE) (N/A) as a surgical intervention .  The patient's history has been reviewed, patient examined, no change in status, stable for surgery.  I have reviewed the patient's chart and labs.  Questions were answered to the patient's satisfaction.     Kristeen MissPhilip Nahser

## 2016-11-15 ENCOUNTER — Encounter (HOSPITAL_COMMUNITY): Payer: Self-pay | Admitting: Physician Assistant

## 2016-11-15 DIAGNOSIS — I071 Rheumatic tricuspid insufficiency: Secondary | ICD-10-CM

## 2016-11-15 DIAGNOSIS — M869 Osteomyelitis, unspecified: Secondary | ICD-10-CM

## 2016-11-15 DIAGNOSIS — I2690 Septic pulmonary embolism without acute cor pulmonale: Secondary | ICD-10-CM

## 2016-11-15 LAB — BASIC METABOLIC PANEL
Anion gap: 10 (ref 5–15)
BUN: 14 mg/dL (ref 6–20)
CALCIUM: 7.8 mg/dL — AB (ref 8.9–10.3)
CO2: 24 mmol/L (ref 22–32)
CREATININE: 0.72 mg/dL (ref 0.61–1.24)
Chloride: 98 mmol/L — ABNORMAL LOW (ref 101–111)
Glucose, Bld: 118 mg/dL — ABNORMAL HIGH (ref 65–99)
Potassium: 5 mmol/L (ref 3.5–5.1)
SODIUM: 132 mmol/L — AB (ref 135–145)

## 2016-11-15 LAB — CBC
HCT: 27.8 % — ABNORMAL LOW (ref 39.0–52.0)
HEMOGLOBIN: 9.2 g/dL — AB (ref 13.0–17.0)
MCH: 27.9 pg (ref 26.0–34.0)
MCHC: 33.1 g/dL (ref 30.0–36.0)
MCV: 84.2 fL (ref 78.0–100.0)
PLATELETS: 311 10*3/uL (ref 150–400)
RBC: 3.3 MIL/uL — ABNORMAL LOW (ref 4.22–5.81)
RDW: 14.9 % (ref 11.5–15.5)
WBC: 12.5 10*3/uL — ABNORMAL HIGH (ref 4.0–10.5)

## 2016-11-15 LAB — VANCOMYCIN, TROUGH: VANCOMYCIN TR: 17 ug/mL (ref 15–20)

## 2016-11-15 MED ORDER — DIPHENHYDRAMINE HCL 25 MG PO CAPS
25.0000 mg | ORAL_CAPSULE | Freq: Three times a day (TID) | ORAL | Status: DC | PRN
  Administered 2016-11-15 – 2016-11-16 (×2): 25 mg via ORAL
  Filled 2016-11-15 (×2): qty 1

## 2016-11-15 MED ORDER — LINEZOLID 600 MG PO TABS
600.0000 mg | ORAL_TABLET | Freq: Two times a day (BID) | ORAL | Status: DC
  Administered 2016-11-15 – 2016-11-17 (×4): 600 mg via ORAL
  Filled 2016-11-15 (×5): qty 1

## 2016-11-15 NOTE — Care Management Note (Addendum)
Case Management Note  Patient Details  Name: Cody Rios MRN: [id number] Date of Birth: 1990/09/20  Subjective/Objective: Pt presented for MSSA Endocarditis and multifocal Pneumonia with Septic Pulmonary Emboli. 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 Pt is without insurance at this time. Plan will be for home once stable.                  Action/Plan: CM received consult for MATCH Program: CM had checked cost for medication Linezolid(ZYVOX) 600 mg po bid/ cash price will be $139.00 for generic.  Pt is aware of cost and he states he will be able to afford. MATCH not needed at this time. No further needs from CM at this time.    Expected Discharge Date:                  Expected Discharge Plan:  Home/Self Care  In-House Referral:  Clinical Social Work, Museum/gallery exhibitions officerinancial Counselor  Discharge planning Services  CM Consult, Medication Assistance  Post Acute Care Choice:  NA Choice offered to:  NA  DME Arranged:  N/A DME Agency:  NA  HH Arranged:  NA HH Agency:  NA  Status of Service:  Completed, signed off  If discussed at [employer] Length of Stay Meetings, dates discussed:  11-17-16  Additional Comments: 1453 11-17-16 Tomi BambergerBrenda Graves-Bigelow, RN,BSN [phone] CM did [facility] to [facility]-Rx written for Suboxone and pt is aware that he has a follow up at the [facility] on next Tuesday at 11:00 am. No further needs from CM at this time.    1156 11-17-16 Tomi BambergerBrenda Graves-Bigelow, RN,BSN [phone] CM did cal [facility] this am in regards to cost of medication Linezolid(ZYVOX). Medication [facility]. CM will place this location for pick up on AVS for Linezolid(ZYVOX). Pt states he will be compliant with medication and is able to afford cost of medication.    Gala LewandowskyGraves-Bigelow, Briauna Gilmartin Kaye, RN 11/15/2016, 3:59 PM

## 2016-11-15 NOTE — Progress Notes (Signed)
Progress Note  Patient Name: Cody Rios Date of Encounter: 11/15/2016  Primary Cardiologist: Dr. Jens Som  Subjective   Did have some soft blood pressures overnight. TEE revealed multiple vegetations of tricuspid valve with mod-sev regurg.  Body hurts all over.   Inpatient Medications    Scheduled Meds: . buprenorphine-naloxone  1 tablet Sublingual BID  . cloNIDine  0.1 mg Oral QAC breakfast  . enoxaparin (LOVENOX) injection  40 mg Subcutaneous Q24H  . pantoprazole  40 mg Oral Daily  . sodium chloride flush  3 mL Intravenous Q12H   Continuous Infusions: . vancomycin 1,250 mg (11/15/16 0857)   PRN Meds: acetaminophen **OR** acetaminophen, ondansetron **OR** ondansetron (ZOFRAN) IV   Vital Signs    Vitals:   11/14/16 1245 11/14/16 1300 11/14/16 2359 11/15/16 0421  BP: (!) 101/40 (!) 100/45 (!) 103/56 106/65  Pulse: 77 80 99 93  Resp: 15 18 18 20   Temp:  98.6 F (37 C) 99.4 F (37.4 C) 98.9 F (37.2 C)  TempSrc:  Oral Oral Oral  SpO2: 98% 98% 95% 96%  Weight:    162 lb 0.6 oz (73.5 kg)  Height:        Intake/Output Summary (Last 24 hours) at 11/15/16 0901 Last data filed at 11/15/16 0525  Gross per 24 hour  Intake             1790 ml  Output                0 ml  Net             1790 ml   Filed Weights   11/14/16 0623 11/14/16 1136 11/15/16 0421  Weight: 159 lb 9.6 oz (72.4 kg) 159 lb (72.1 kg) 162 lb 0.6 oz (73.5 kg)    Telemetry    SR, - Personally Reviewed   Physical Exam   GEN: Well nourished, well developed, caucasian male with no acute distress. HEENT: normal  Neck: no JVD, carotid bruits, or masses Cardiac: RRR. no murmurs, rubs, or gallops,no edema. Intact distal pulses bilaterally.  Respiratory: clear to auscultation bilaterally, normal work of breathing GI: soft, nontender, nondistended, + BS MS: no deformity or atrophy  Skin: warm and dry, no rash  Neuro: Alert and Oriented x 3, Strength and sensation are intact Psych:    Full affect  Labs    Chemistry Recent Labs Lab 11/09/16 0257  11/11/16 0250 11/12/16 0545 11/14/16 0637 11/15/16 0217  NA 135  < > 134* 136 133* 132*  K 3.7  < > 3.4* 4.9 4.2 5.0  CL 101  < > 102 102 98* 98*  CO2 28  < > 24 26 29 24   GLUCOSE 113*  < > 137* 91 112* 118*  BUN 10  < > 8 17 12 14   CREATININE 0.64  < > 0.63 0.67 0.65 0.72  CALCIUM 8.2*  < > 7.8* 8.4* 8.2* 7.8*  PROT 5.8*  --  5.3* 6.3*  --   --   ALBUMIN 2.7*  --  2.1* 2.3*  --   --   AST 44*  --  32 35  --   --   ALT 52  --  45 43  --   --   ALKPHOS 210*  --  217* 199*  --   --   BILITOT 1.2  --  1.1 1.4*  --   --   GFRNONAA >60  < > >60 >60 >60 >60  GFRAA >60  < > >  60 >60 >60 >60  ANIONGAP 6  < > 8 8 6 10   < > = values in this interval not displayed.   Hematology Recent Labs Lab 11/13/16 0324 11/14/16 0637 11/15/16 0217  WBC 11.8* 12.8* 12.5*  RBC 3.63* 3.51* 3.30*  HGB 10.1* 9.9* 9.2*  HCT 30.3* 29.2* 27.8*  MCV 83.5 83.2 84.2  MCH 27.8 28.2 27.9  MCHC 33.3 33.9 33.1  RDW 15.3 15.0 14.9  PLT 422* 335 311    Cardiac EnzymesNo results for input(s): TROPONINI in the last 168 hours.  Recent Labs Lab 11/09/16 0311 11/09/16 2244  TROPIPOC 0.00 0.00     BNP Recent Labs Lab 11/09/16 0258  BNP 139.6*     DDimer No results for input(s): DDIMER in the last 168 hours.   Radiology    No results found.  Cardiac Studies   Echocardiogram Transesophageal: 11/15/2016 Study Conclusions  - Left ventricle: Systolic function was vigorous. The estimated   ejection fraction was in the range of 65% to 70%. - Aortic valve: No evidence of vegetation. - Mitral valve: No evidence of vegetation. - Left atrium: No evidence of thrombus in the atrial cavity or   appendage. - Tricuspid valve: There was a vegetation. There was   moderate-severe regurgitation.   Echocardiogram: 11/09/2016 Study Conclusions  - Left ventricle: The cavity size was mildly dilated. There was mild focal basal  hypertrophy of the septum. Systolic function was normal. The estimated ejection fraction was in the range of 60% to 65%. Wall motion was normal; there were no regional wall motion abnormalities. Left ventricular diastolic function parameters were normal. - Mitral valve: There was mild regurgitation. - Left atrium: The atrium was mildly dilated. - Right atrium: The atrium was severely dilated. - Tricuspid valve: Mobile density on the atrial side of the tricuspid valve measuring 0.9 x 0.57cm in diameter and consistent with vegetation. There was severe regurgitation. - Pulmonary arteries: PA peak pressure: 59 mm Hg (S). - Pericardium, extracardiac: A trivial pericardial effusion was identified posterior to the heart.  Impressions:  - There was a vegetation, consistent with endocarditis. The right ventricular systolic pressure was increased consistent with moderate pulmonary hypertension.  Patient Profile     26 y.o. 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 Blood cultures are positive for methicillin sensitive staph aureus. Chest CT shows probable septic emboli and small pericardial effusion.    Assessment & Plan    1. Tricuspid valve endocarditis/septic emboli/MSSA Bacteremia: Mr. Fowle has multiple vegetations on his tricuspid valve with moderate-severe associated regurgitation. No abscess formation noted. His EF is 60-65% with no diastolic dysfunction.  -- He was IDU and has been caught injecting in his room but has been started on Suboxone for opiate withdrawal treatment and is feeling better from that standpoint. [facility] to get off pills but started using heroine at the same time. --  He is being followed by [facility] for antibiotic therapy they have seen him this morning and are trying to get approval for PO medications -- Unfortunately, he  is a poor surgical candidate given his difficulty with not using injectable drugs and ability to be compliant with long term anticoagulants but he may need a formal surgical evaluation for the Tricuspid Valve, will discuss this with attending  2. Heroin abuse: On Suboxone 16 mg and tolerating this well, per IM  3.  Osteomyelitis: Orthopedics has seen. No surgical drainage needed. Continue IV abx.  4. Chest pain: This is likely related to the septic embolic and MSK. Continue NSAIDs.  Suan HalterSigned, Acxel Dingee G, PA-C  11/15/2016, 9:01 AM

## 2016-11-15 NOTE — Progress Notes (Signed)
      INFECTIOUS DISEASE ATTENDING ADDENDUM:   Date: 11/15/2016  Patient name: Cody Rios  Medical record number: [id number]  Date of birth: 03-29-1991    This patient has been seen and discussed with the house staff. Please see the resident's note for complete details. I concur with their findings with the following additions/corrections:  Since I examined Bryant this am he developed a rash from his ankles but also on his arms.It is intensely pruritic  See pics  11/15/16:       I am DC his vancomycin in case this is causing this rash. It does not look like oslers or janeways  I will start zyvox orally.  We still do need CVTS opinion.    Acey LavCornelius Van Dam 11/15/2016, 7:02 PM

## 2016-11-15 NOTE — Progress Notes (Signed)
Pharmacy Antibiotic Note  Cody Rios is a 26 y.o. male with MSSA bacteremia/TV endocarditis, R ankle osteo and infectious tenosynovotis.  Vancomycin trough this evening = 17, therapeutic  Plan: Continue vancomycin 1250 mg iv Q 6 hours Continue to follow  Height: 6\' 2"  (188 cm) Weight: 162 lb 0.6 oz (73.5 kg) IBW/kg (Calculated) : 82.2  Temp (24hrs), Avg:98.9 F (37.2 C), Min:98.4 F (36.9 C), Max:99.4 F (37.4 C)   Recent Labs Lab 11/09/16 0313 11/09/16 1030 11/09/16 1356 11/09/16 2214 11/10/16 1216 11/11/16 0250  11/12/16 0545 11/13/16 0324 11/14/16 0637 11/15/16 0217 11/15/16 1541  WBC  --   --   --  17.6*  --  17.8*  --  12.4* 11.8* 12.8* 12.5*  --   CREATININE  --   --   --  0.59*  --  0.63  --  0.67  --  0.65 0.72  --   LATICACIDVEN 0.88 1.3 2.1*  --  1.1  --   --   --   --   --   --   --   VANCOTROUGH  --   --   --   --   --   --   < > 10*  --  9*  --  17  < > = values in this interval not displayed.  Estimated Creatinine Clearance: 144.2 mL/min (by C-G formula based on SCr of 0.72 mg/dL).    Allergies  Allergen Reactions  . Cephalosporins Hives  . Penicillins Hives, Shortness Of Breath and Rash    8/9 Azactam x 1 dose 8/9 vancomycin >>  8/11 VT 10 (on 1000mg  IV q8)- true trough 8/13 VT= 9 on 1250mg  IV q8  8/11 blood x 2- ngtd 8/10 blood x 2  S.Aureus in both *BCID MSSA) 8/9 urine- neg 8/8 HIV: non reactive  8/7: BCx: GPC/clusters 2/2; MSSA per BCID   Thank you for allowing pharmacy to be a part of this patient's care. Okey RegalLisa Arif Amendola, PharmD [phone]  11/15/2016 5:01 PM

## 2016-11-15 NOTE — Progress Notes (Signed)
[facility]  PROGRESS NOTE  Ardath SaxDustin A Bunner ZOX:[id number] DOB: 08/22/1990 DOA: 11/10/2016 PCP: System, [name]   Brief HPI:    26 y.o. male who presents with IVDA w/ known MSSA bacteremia, endocarditis w/ multifocal pneumonia and septic pulmonary emboli. Of note pt left [facility] hospital the day prior AMA. Patient was called at home for MSSA  bacteremia and he came back to hospital.  Patient was started on IV vancomycin, as he has allergy to cephalosporin, penicillin. He underwent TEE which confirmed multiple vegetations on tricuspid valve with moderate to severe tricuspid regurgitation. Patient has been started on Suboxone by internal medicine clinic.  Subjective      Assessment/Plan:     1. Tricuspid valve endocarditis/septic pulmonary emboli- CT chest shows bilateral multifocal pneumonia likely septic emboli from right side endocarditis. TTE shows vegetation on tricuspid valve with significant tricuspid regurgitation and high PA pressure. Blood cultures x 2  positive for MSSA. Patient is currently on vancomycin. ID and Cardiology  are following. TEE Confirmed multiple vegetations on tricuspid valve with moderate to severe regurgitation. Will consult CT surgery for further recommendations.. 2. MSSA bacteremia- blood cultures on 11/09/2016 showed MSSA bacteremia in 2 out of 2 bottles. Patient currently on vancomycin per pharmacy. ID plans to discharge patient on Zyvox once he is ready for discharge.. 3. Right ankle osteomyelitis- MRI of the right ankle shows osteomyelitis, continue antibiotics as above. Orthopedic surgery was consulted, and recommended to continue with antibiotics. No surgical intervention at this time. 4. Left knee pain- synovial fluid aspirated by orthopedics on 8/8 showed no growth. Continue Toradol 15 mg IV every 6 hours 5. 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 He has been started on Suboxone by internal medicine  clinic attending Dr. [name] Duncan Thomas. Will continue with Suboxone,Bentyl, Catapres tapering dose to help with withdrawal symptoms. No withdrawal l symptoms at this time. 6. Tenosynovitis of right wrist- tenosynovitis likely infectious confirmed on MRI 11/09/2016. Continue antibiotics as above. 7. History of juvenile rheumatoid arthritis- patient has been taking sulfasalazine for 2-3 years but stopped because he didn't think it was helping. 8. Anemia- hemoglobin is stable at 9.2, likely due to malnutrition in setting of IV drug abuse. Iron level 42, saturation 16%, ferritin 276, B12 1379, folate 18.2.    DVT prophylaxis: Lovenox  Code Status: Full code  Family Communication: No family present at bedside  Disposition Plan: Pending CT surgery recommendation   Consultants:  Infectious disease   Procedures:  None  Continuous infusions . vancomycin Stopped (11/15/16 1027)      Antibiotics:   Anti-infectives    Start     Dose/Rate Route Frequency Ordered Stop   11/14/16 1000  vancomycin (VANCOCIN) 1,250 mg in sodium chloride 0.9 % 250 mL IVPB     1,250 mg 166.7 mL/hr over 90 Minutes Intravenous Every 6 hours 11/14/16 0914     11/12/16 2200  vancomycin (VANCOCIN) 1,500 mg in sodium chloride 0.9 % 500 mL IVPB  Status:  Discontinued     1,500 mg 250 mL/hr over 120 Minutes Intravenous Every 8 hours 11/12/16 1255 11/12/16 1346   11/12/16 2200  vancomycin (VANCOCIN) 1,250 mg in sodium chloride 0.9 % 250 mL IVPB  Status:  Discontinued     1,250 mg 166.7 mL/hr over 90 Minutes Intravenous Every 8 hours 11/12/16 1346 11/14/16 0914   11/12/16 1300  vancomycin (VANCOCIN) 1,750 mg in sodium chloride 0.9 % 500 mL IVPB  1,750 mg 250 mL/hr over 120 Minutes Intravenous NOW 11/12/16 1255 11/12/16 1533   11/10/16 1400  aztreonam (AZACTAM) 2 g in dextrose 5 % 50 mL IVPB  Status:  Discontinued     2 g 100 mL/hr over 30 Minutes Intravenous Every 8 hours 11/10/16 0740 11/10/16 0912    11/10/16 1400  vancomycin (VANCOCIN) IVPB 1000 mg/200 mL premix  Status:  Discontinued     1,000 mg 200 mL/hr over 60 Minutes Intravenous Every 8 hours 11/10/16 0740 11/12/16 1255   11/10/16 0730  aztreonam (AZACTAM) 2 g in dextrose 5 % 50 mL IVPB     2 g 100 mL/hr over 30 Minutes Intravenous  Once 11/10/16 0720 11/10/16 0930   11/10/16 0730  vancomycin (VANCOCIN) IVPB 1000 mg/200 mL premix  Status:  Discontinued     1,000 mg 200 mL/hr over 60 Minutes Intravenous  Once 11/10/16 0720 11/10/16 0726   11/10/16 0030  vancomycin (VANCOCIN) IVPB 1000 mg/200 mL premix     1,000 mg 200 mL/hr over 60 Minutes Intravenous  Once 11/10/16 0027 11/10/16 0756       Objective   Vitals:   11/14/16 2359 11/15/16 0421 11/15/16 0900 11/15/16 1300  BP: (!) 103/56 106/65 114/67 101/60  Pulse: 99 93 91 84  Resp: 18 20 18 18   Temp: 99.4 F (37.4 C) 98.9 F (37.2 C) 99 F (37.2 C) 98.4 F (36.9 C)  TempSrc: Oral Oral Oral Oral  SpO2: 95% 96% 100% 100%  Weight:  73.5 kg (162 lb 0.6 oz)    Height:        Intake/Output Summary (Last 24 hours) at 11/15/16 1606 Last data filed at 11/15/16 0525  Gross per 24 hour  Intake             1490 ml  Output                0 ml  Net             1490 ml   Filed Weights   11/14/16 0623 11/14/16 1136 11/15/16 0421  Weight: 72.4 kg (159 lb 9.6 oz) 72.1 kg (159 lb) 73.5 kg (162 lb 0.6 oz)     Physical Examination:   Physical Exam: Eyes: No icterus, extraocular muscles intact  Mouth: Oral mucosa is moist, no lesions on palate,  Neck: Supple, no deformities, masses, or tenderness Lungs: Normal respiratory effort, bilateral clear to auscultation, no crackles or wheezes.  Heart: Regular rate and rhythm, S1 and S2 normal, no murmurs, rubs auscultated Abdomen: BS normoactive,soft,nondistended,non-tender to palpation,no organomegaly Extremities: No pretibial edema, no erythema, no cyanosis, no clubbing Neuro : Alert and oriented to time, place and person, No  focal deficits Skin: No rashes seen on exam    Data Reviewed: I have personally reviewed following labs and imaging studies  CBG: No results for input(s): GLUCAP in the last 168 hours.  CBC:  Recent Labs Lab 11/09/16 0257  11/11/16 0250 11/12/16 0545 11/13/16 0324 11/14/16 0637 11/15/16 0217  WBC 16.4*  < > 17.8* 12.4* 11.8* 12.8* 12.5*  NEUTROABS 12.0*  --   --   --   --   --   --   HGB 9.9*  < > 9.0* 11.0* 10.1* 9.9* 9.2*  HCT 27.9*  < > 25.9* 31.1* 30.3* 29.2* 27.8*  MCV 83.0  < > 80.7 83.2 83.5 83.2 84.2  PLT 189  < > 310 371 422* 335 311  < > = values in this interval  not displayed.  Basic Metabolic Panel:  Recent Labs Lab 11/09/16 2214 11/10/16 1804 11/11/16 0250 11/12/16 0545 11/14/16 0637 11/15/16 0217  NA 133*  --  134* 136 133* 132*  K 4.0  --  3.4* 4.9 4.2 5.0  CL 101  --  102 102 98* 98*  CO2 25  --  24 26 29 24   GLUCOSE 115*  --  137* 91 112* 118*  BUN 6  --  8 17 12 14   CREATININE 0.59*  --  0.63 0.67 0.65 0.72  CALCIUM 8.0*  --  7.8* 8.4* 8.2* 7.8*  MG  --  1.9  --   --   --   --     Recent Results (from the past 240 hour(s))  Culture, blood (routine x 2)     Status: Abnormal   Collection Time: 11/08/16 12:15 AM  Result Value Ref Range Status   Specimen Description BLOOD RIGHT ARM  Final   Special Requests   Final    BOTTLES DRAWN AEROBIC AND ANAEROBIC Blood Culture adequate volume   Culture  Setup Time   Final    GRAM POSITIVE COCCI IN CLUSTERS IN BOTH AEROBIC AND ANAEROBIC BOTTLES CRITICAL RESULT CALLED TO, READ BACK BY AND VERIFIED WITH: A. HARTLEY RN 11/18/16 1835 BEAMJ    Culture (A)  Final    STAPHYLOCOCCUS AUREUS SUSCEPTIBILITIES PERFORMED ON PREVIOUS CULTURE WITHIN THE LAST 5 DAYS. Performed at [facility], [address], [place], [place] [address]    Report Status 11/10/2016 FINAL  Final  Culture, blood (routine x 2)     Status: Abnormal   Collection Time: 11/08/16 12:18 AM  Result Value Ref Range Status   Specimen  Description BLOOD BLOOD RIGHT HAND  Final   Special Requests IN PEDIATRIC BOTTLE Blood Culture adequate volume  Final   Culture  Setup Time   Final    GRAM POSITIVE COCCI IN CLUSTERS IN PEDIATRIC BOTTLE CRITICAL VALUE NOTED.  VALUE IS CONSISTENT WITH PREVIOUSLY REPORTED AND CALLED VALUE. Performed at [facility], [address] [place], [place] [address]    Culture STAPHYLOCOCCUS AUREUS (A)  Final   Report Status 11/10/2016 FINAL  Final   Organism ID, Bacteria STAPHYLOCOCCUS AUREUS  Final      Susceptibility   Staphylococcus aureus - MIC*    CIPROFLOXACIN <=0.5 SENSITIVE Sensitive     ERYTHROMYCIN >=8 RESISTANT Resistant     GENTAMICIN <=0.5 SENSITIVE Sensitive     OXACILLIN 0.5 SENSITIVE Sensitive     TETRACYCLINE <=1 SENSITIVE Sensitive     VANCOMYCIN <=0.5 SENSITIVE Sensitive     TRIMETH/SULFA <=10 SENSITIVE Sensitive     CLINDAMYCIN RESISTANT Resistant     RIFAMPIN <=0.5 SENSITIVE Sensitive     Inducible Clindamycin POSITIVE Resistant     * STAPHYLOCOCCUS AUREUS  Blood Culture ID Panel (Reflexed)     Status: Abnormal   Collection Time: 11/08/16 12:18 AM  Result Value Ref Range Status   Enterococcus species NOT DETECTED NOT DETECTED Final   Listeria monocytogenes NOT DETECTED NOT DETECTED Final   Staphylococcus species DETECTED (A) NOT DETECTED Final    Comment: CRITICAL RESULT CALLED TO, READ BACK BY AND VERIFIED WITH: A. HARTLEY RN 11/08/16 1835 BEAMJ    Staphylococcus aureus DETECTED (A) NOT DETECTED Final    Comment: Methicillin (oxacillin) susceptible Staphylococcus aureus (MSSA). Preferred therapy is anti staphylococcal beta lactam antibiotic (Cefazolin or Nafcillin), unless clinically contraindicated. CRITICAL RESULT CALLED TO, READ BACK BY AND VERIFIED WITH:  A. HARTLEY RN 11/08/16 1835 BEAMJ    Methicillin resistance NOT DETECTED NOT DETECTED Final   Streptococcus species NOT DETECTED NOT DETECTED Final   Streptococcus agalactiae NOT DETECTED NOT DETECTED  Final   Streptococcus pneumoniae NOT DETECTED NOT DETECTED Final   Streptococcus pyogenes NOT DETECTED NOT DETECTED Final   Acinetobacter baumannii NOT DETECTED NOT DETECTED Final   Enterobacteriaceae species NOT DETECTED NOT DETECTED Final   Enterobacter cloacae complex NOT DETECTED NOT DETECTED Final   Escherichia coli NOT DETECTED NOT DETECTED Final   Klebsiella oxytoca NOT DETECTED NOT DETECTED Final   Klebsiella pneumoniae NOT DETECTED NOT DETECTED Final   Proteus species NOT DETECTED NOT DETECTED Final   Serratia marcescens NOT DETECTED NOT DETECTED Final   Haemophilus influenzae NOT DETECTED NOT DETECTED Final   Neisseria meningitidis NOT DETECTED NOT DETECTED Final   Pseudomonas aeruginosa NOT DETECTED NOT DETECTED Final   Candida albicans NOT DETECTED NOT DETECTED Final   Candida glabrata NOT DETECTED NOT DETECTED Final   Candida krusei NOT DETECTED NOT DETECTED Final   Candida parapsilosis NOT DETECTED NOT DETECTED Final   Candida tropicalis NOT DETECTED NOT DETECTED Final    Comment: Performed at [facility], [address], [place], [place] [address]  Culture, blood (Routine X 2) w Reflex to ID Panel     Status: Abnormal   Collection Time: 11/09/16  2:58 AM  Result Value Ref Range Status   Specimen Description BLOOD LEFT ANTECUBITAL  Final   Special Requests   Final    BOTTLES DRAWN AEROBIC ONLY Blood Culture adequate volume   Culture  Setup Time   Final    GRAM POSITIVE COCCI IN CLUSTERS AEROBIC BOTTLE ONLY CRITICAL RESULT CALLED TO, READ BACK BY AND VERIFIED WITH: PHARMD M BELL [id number] MLM    Culture (A)  Final    STAPHYLOCOCCUS AUREUS SUSCEPTIBILITIES PERFORMED ON PREVIOUS CULTURE WITHIN THE LAST 5 DAYS. Performed at [facility], [address] [place], [place] [address]    Report Status 11/10/2016 FINAL  Final  Body fluid culture     Status: None   Collection Time: 11/09/16  4:12 AM  Result Value Ref Range Status   Specimen Description  SYNOVIAL  Final   Special Requests NONE  Final   Gram Stain   Final    ABUNDANT WBC PRESENT,BOTH PMN AND MONONUCLEAR NO ORGANISMS SEEN    Culture   Final    NO GROWTH 3 DAYS INTERPRET RESULTS WITH CAUTION DUE TO LIMITED SPECIMEN VOLUME Performed at [facility], [address], [place], [place] [address]    Report Status 11/12/2016 FINAL  Final  Culture, blood (Routine X 2) w Reflex to ID Panel     Status: Abnormal   Collection Time: 11/09/16  7:44 AM  Result Value Ref Range Status   Specimen Description BLOOD RIGHT ARM  Final   Special Requests IN PEDIATRIC BOTTLE Blood Culture adequate volume  Final   Culture  Setup Time   Final    GRAM POSITIVE COCCI IN CLUSTERS IN PEDIATRIC BOTTLE CRITICAL RESULT CALLED TO, READ BACK BY AND VERIFIED WITH: G.ABBOTT, PHARMD 11/10/16 0017 L.CHAMPION    Culture (A)  Final    STAPHYLOCOCCUS AUREUS SUSCEPTIBILITIES PERFORMED ON PREVIOUS CULTURE WITHIN THE LAST 5 DAYS. Performed at [facility], [address], [place], [place] [address]    Report Status 11/11/2016 FINAL  Final  Urine Culture     Status: Abnormal   Collection  Time: 11/10/16 10:52 AM  Result Value Ref Range Status   Specimen Description URINE, RANDOM  Final   Special Requests NONE  Final   Culture <10,000 COLONIES/mL INSIGNIFICANT GROWTH (A)  Final   Report Status 11/11/2016 FINAL  Final  Culture, blood (Routine X 2) w Reflex to ID Panel     Status: Abnormal   Collection Time: 11/11/16  6:44 PM  Result Value Ref Range Status   Specimen Description BLOOD LEFT HAND  Final   Special Requests   Final    BOTTLES DRAWN AEROBIC AND ANAEROBIC Blood Culture adequate volume   Culture  Setup Time   Final    GRAM POSITIVE COCCI IN CLUSTERS ANAEROBIC BOTTLE ONLY CRITICAL VALUE NOTED.  VALUE IS CONSISTENT WITH PREVIOUSLY REPORTED AND CALLED VALUE.    Culture (A)  Final    STAPHYLOCOCCUS AUREUS SUSCEPTIBILITIES PERFORMED ON PREVIOUS CULTURE WITHIN THE LAST 5 DAYS.    Report  Status 11/14/2016 FINAL  Final  Culture, blood (Routine X 2) w Reflex to ID Panel     Status: Abnormal   Collection Time: 11/11/16  6:44 PM  Result Value Ref Range Status   Specimen Description BLOOD LEFT ARM  Final   Special Requests IN PEDIATRIC BOTTLE Blood Culture adequate volume  Final   Culture  Setup Time   Final    GRAM POSITIVE COCCI IN CLUSTERS IN PEDIATRIC BOTTLE Organism ID to follow CRITICAL RESULT CALLED TO, READ BACK BY AND VERIFIED WITH: H BAIRD 11/12/16 @ 1326 M VESTAL    Culture STAPHYLOCOCCUS AUREUS (A)  Final   Report Status 11/14/2016 FINAL  Final   Organism ID, Bacteria STAPHYLOCOCCUS AUREUS  Final      Susceptibility   Staphylococcus aureus - MIC*    CIPROFLOXACIN <=0.5 SENSITIVE Sensitive     ERYTHROMYCIN >=8 RESISTANT Resistant     GENTAMICIN <=0.5 SENSITIVE Sensitive     OXACILLIN 0.5 SENSITIVE Sensitive     TETRACYCLINE <=1 SENSITIVE Sensitive     VANCOMYCIN <=0.5 SENSITIVE Sensitive     TRIMETH/SULFA <=10 SENSITIVE Sensitive     CLINDAMYCIN RESISTANT Resistant     RIFAMPIN <=0.5 SENSITIVE Sensitive     Inducible Clindamycin POSITIVE Resistant     * STAPHYLOCOCCUS AUREUS  Blood Culture ID Panel (Reflexed)     Status: Abnormal   Collection Time: 11/11/16  6:44 PM  Result Value Ref Range Status   Enterococcus species NOT DETECTED NOT DETECTED Final   Vancomycin resistance NOT DETECTED NOT DETECTED Final   Listeria monocytogenes NOT DETECTED NOT DETECTED Final   Staphylococcus species DETECTED (A) NOT DETECTED Final    Comment: CRITICAL RESULT CALLED TO, READ BACK BY AND VERIFIED WITH: H BAIRD 11/12/16 @ 1326 M VESTAL    Staphylococcus aureus DETECTED (A) NOT DETECTED Final    Comment: CRITICAL RESULT CALLED TO, READ BACK BY AND VERIFIED WITH: H BAIRD 11/12/16 @ 1326 M VESTAL    Methicillin resistance NOT DETECTED NOT DETECTED Final   Streptococcus species NOT DETECTED NOT DETECTED Final   Streptococcus agalactiae NOT DETECTED NOT DETECTED Final    Streptococcus pneumoniae NOT DETECTED NOT DETECTED Final   Streptococcus pyogenes NOT DETECTED NOT DETECTED Final   Acinetobacter baumannii NOT DETECTED NOT DETECTED Final   Enterobacteriaceae species NOT DETECTED NOT DETECTED Final   Enterobacter cloacae complex NOT DETECTED NOT DETECTED Final   Escherichia coli NOT DETECTED NOT DETECTED Final   Klebsiella oxytoca NOT DETECTED NOT DETECTED Final   Klebsiella pneumoniae NOT DETECTED NOT DETECTED Final  Proteus species NOT DETECTED NOT DETECTED Final   Serratia marcescens NOT DETECTED NOT DETECTED Final   Carbapenem resistance NOT DETECTED NOT DETECTED Final   Haemophilus influenzae NOT DETECTED NOT DETECTED Final   Neisseria meningitidis NOT DETECTED NOT DETECTED Final   Pseudomonas aeruginosa NOT DETECTED NOT DETECTED Final   Candida albicans NOT DETECTED NOT DETECTED Final   Candida glabrata NOT DETECTED NOT DETECTED Final   Candida krusei NOT DETECTED NOT DETECTED Final   Candida parapsilosis NOT DETECTED NOT DETECTED Final   Candida tropicalis NOT DETECTED NOT DETECTED Final  Culture, blood (Routine X 2) w Reflex to ID Panel     Status: None (Preliminary result)   Collection Time: 11/12/16  4:30 PM  Result Value Ref Range Status   Specimen Description BLOOD LEFT ARM  Final   Special Requests   Final    BOTTLES DRAWN AEROBIC AND ANAEROBIC Blood Culture results may not be optimal due to an excessive volume of blood received in culture bottles   Culture NO GROWTH 3 DAYS  Final   Report Status PENDING  Incomplete  Culture, blood (Routine X 2) w Reflex to ID Panel     Status: None (Preliminary result)   Collection Time: 11/12/16  4:42 PM  Result Value Ref Range Status   Specimen Description BLOOD LEFT WRIST  Final   Special Requests   Final    BOTTLES DRAWN AEROBIC AND ANAEROBIC Blood Culture results may not be optimal due to an excessive volume of blood received in culture bottles   Culture NO GROWTH 3 DAYS  Final   Report Status  PENDING  Incomplete     Liver Function Tests:  Recent Labs Lab 11/09/16 0257 11/11/16 0250 11/12/16 0545  AST 44* 32 35  ALT 52 45 43  ALKPHOS 210* 217* 199*  BILITOT 1.2 1.1 1.4*  PROT 5.8* 5.3* 6.3*  ALBUMIN 2.7* 2.1* 2.3*   No results for input(s): LIPASE, AMYLASE in the last 168 hours. No results for input(s): AMMONIA in the last 168 hours.  Cardiac Enzymes: No results for input(s): CKTOTAL, CKMB, CKMBINDEX, TROPONINI in the last 168 hours. BNP (last 3 results)  Recent Labs  11/09/16 0258  BNP 139.6*    ProBNP (last 3 results) No results for input(s): PROBNP in the last 8760 hours.    Studies: No results found.  Scheduled Meds: . buprenorphine-naloxone  1 tablet Sublingual BID  . cloNIDine  0.1 mg Oral QAC breakfast  . enoxaparin (LOVENOX) injection  40 mg Subcutaneous Q24H  . pantoprazole  40 mg Oral Daily  . sodium chloride flush  3 mL Intravenous Q12H      Time spent: 20 min  [facility]   [facility] Pager [phone]. If 7PM-7AM, please contact night-coverage at [email or web address], Office  [phone]  password [email or web address]  11/15/2016, 4:06 PM  LOS: 5 days

## 2016-11-15 NOTE — Progress Notes (Addendum)
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 Will follow as outpatient.  -Continue vancomycin, pharmacy assistance with dosing. Vancomycin trough appears low.  -good renal function cr=0.72  Right Third Metatarsal Osteomyelitis MRI of right ankle and right foot showed presence of osteomyelitis in the proximal 3rd metatarsal shaft. The patient continues to have pain in his right 1st and 3rd digits.  -Per Ortho there is no indication for surgical procedure at this time.  -Continue vancomycin  Tenosynovitis of right wrist  MRI on 8/8 showed indication of tenosynovitis thought to be due to infection. The patient states that he no longer has pain in his wrist.  Septic pulmonary embolism CT angio from 8/9 shows presence of bilateral septic emboli -continue vancomycin   Right Knee Effusion Synovial fluid does not show any growth per gram stain and culture. Cell count and diff was not done.  IVDU Patient is tolerating suboxone therapy well. -continue 16mg  suboxone, clonidine, bentyl, loperamide, and zofran for symptomatic treatment   . buprenorphine-naloxone  1 tablet Sublingual BID  . cloNIDine  0.1 mg Oral QAC breakfast  . enoxaparin (LOVENOX) injection  40 mg Subcutaneous Q24H  .  pantoprazole  40 mg Oral Daily  . sodium chloride flush  3 mL Intravenous Q12H    SUBJECTIVE: Cody Rios was seen laying in his bed this morning. He denied having any chills, sweating, sob, chest pain. The patient has started to have small 1-88mm erythematous rash that is itchy. The patient attributes the rash to not bathing and dry skin.   Review of Systems: ROS as above   Allergies  Allergen Reactions  . Cephalosporins Hives  . Penicillins Hives, Shortness Of Breath and Rash    OBJECTIVE: Vitals:   11/14/16 1245 11/14/16 1300 11/14/16 2359 11/15/16 0421  BP: (!) 101/40 (!) 100/45 (!) 103/56 106/65  Pulse: 77 80 99 93  Resp: 15 18 18 20   Temp:  98.6 F (37 C) 99.4 F (37.4 C) 98.9 F (37.2 C)  TempSrc:  Oral Oral Oral  SpO2: 98% 98% 95% 96%  Weight:    162 lb 0.6 oz (73.5 kg)  Height:       Body mass index is 20.8 kg/m.  Physical Exam  Constitutional: He is well-developed, well-nourished, and in no distress. No distress.  HENT:  Head: Normocephalic and atraumatic.  Cardiovascular: Normal rate, regular rhythm, normal heart sounds and intact distal pulses.   No murmur heard. Pulmonary/Chest: Effort normal and breath sounds normal. No respiratory distress. He has no wheezes.  Abdominal: Soft. Bowel sounds are normal. He exhibits no distension. There is no tenderness.  Skin: Rash (small 1-2 mm raised lesions on right lower leg and ankle and right elbow) noted.  Lab Results Lab Results  Component Value Date   WBC 12.5 (H) 11/15/2016   HGB 9.2 (L) 11/15/2016   HCT 27.8 (L) 11/15/2016   MCV 84.2 11/15/2016   PLT 311 11/15/2016    Lab Results  Component Value Date   CREATININE 0.72 11/15/2016   BUN 14 11/15/2016   NA 132 (L) 11/15/2016   K 5.0 11/15/2016   CL 98 (L) 11/15/2016   CO2 24 11/15/2016    Lab Results  Component Value Date   ALT 43 11/12/2016   AST 35 11/12/2016   ALKPHOS 199 (H) 11/12/2016   BILITOT 1.4 (H) 11/12/2016      Microbiology: Recent Results (from the past 240 hour(s))  Culture, blood (routine x 2)     Status: Abnormal   Collection Time: 11/08/16 12:15 AM  Result Value Ref Range Status   Specimen Description BLOOD RIGHT ARM  Final   Special Requests   Final    BOTTLES DRAWN AEROBIC AND ANAEROBIC Blood Culture adequate volume   Culture  Setup Time   Final    GRAM POSITIVE COCCI IN CLUSTERS IN BOTH AEROBIC AND ANAEROBIC BOTTLES CRITICAL RESULT CALLED TO, READ BACK BY AND VERIFIED WITH: A. HARTLEY RN 11/18/16 1835 BEAMJ    Culture (A)  Final    STAPHYLOCOCCUS AUREUS SUSCEPTIBILITIES PERFORMED ON PREVIOUS CULTURE WITHIN THE LAST 5 DAYS. Performed at [facility], [address] [place], [place] [address]    Report Status 11/10/2016 FINAL  Final  Culture, blood (routine x 2)     Status: Abnormal   Collection Time: 11/08/16 12:18 AM  Result Value Ref Range Status   Specimen Description BLOOD BLOOD RIGHT HAND  Final   Special Requests IN PEDIATRIC BOTTLE Blood Culture adequate volume  Final   Culture  Setup Time   Final    GRAM POSITIVE COCCI IN CLUSTERS IN PEDIATRIC BOTTLE CRITICAL VALUE NOTED.  VALUE IS CONSISTENT WITH PREVIOUSLY REPORTED AND CALLED VALUE. Performed at [facility], [address], [place], [place] [address]    Culture STAPHYLOCOCCUS AUREUS (A)  Final   Report Status 11/10/2016 FINAL  Final   Organism ID, Bacteria STAPHYLOCOCCUS AUREUS  Final      Susceptibility   Staphylococcus aureus - MIC*    CIPROFLOXACIN <=0.5 SENSITIVE Sensitive     ERYTHROMYCIN >=8 RESISTANT Resistant     GENTAMICIN <=0.5 SENSITIVE Sensitive     OXACILLIN 0.5 SENSITIVE Sensitive     TETRACYCLINE <=1 SENSITIVE Sensitive     VANCOMYCIN <=0.5 SENSITIVE Sensitive     TRIMETH/SULFA <=10 SENSITIVE Sensitive     CLINDAMYCIN RESISTANT Resistant     RIFAMPIN <=0.5 SENSITIVE Sensitive     Inducible Clindamycin POSITIVE Resistant     * STAPHYLOCOCCUS AUREUS  Blood Culture ID Panel  (Reflexed)     Status: Abnormal   Collection Time: 11/08/16 12:18 AM  Result Value Ref Range Status   Enterococcus species NOT DETECTED NOT DETECTED Final   Listeria monocytogenes NOT DETECTED NOT DETECTED Final   Staphylococcus species DETECTED (A) NOT DETECTED Final    Comment: CRITICAL RESULT CALLED TO, READ BACK BY AND VERIFIED WITH: A. HARTLEY RN 11/08/16 1835 BEAMJ    Staphylococcus aureus DETECTED (A) NOT DETECTED Final    Comment: Methicillin (oxacillin) susceptible Staphylococcus aureus (MSSA). Preferred therapy is anti staphylococcal beta lactam antibiotic (Cefazolin or Nafcillin), unless clinically contraindicated. CRITICAL RESULT CALLED TO, READ BACK BY AND VERIFIED WITH: A. HARTLEY RN 11/08/16 1835 BEAMJ  Methicillin resistance NOT DETECTED NOT DETECTED Final   Streptococcus species NOT DETECTED NOT DETECTED Final   Streptococcus agalactiae NOT DETECTED NOT DETECTED Final   Streptococcus pneumoniae NOT DETECTED NOT DETECTED Final   Streptococcus pyogenes NOT DETECTED NOT DETECTED Final   Acinetobacter baumannii NOT DETECTED NOT DETECTED Final   Enterobacteriaceae species NOT DETECTED NOT DETECTED Final   Enterobacter cloacae complex NOT DETECTED NOT DETECTED Final   Escherichia coli NOT DETECTED NOT DETECTED Final   Klebsiella oxytoca NOT DETECTED NOT DETECTED Final   Klebsiella pneumoniae NOT DETECTED NOT DETECTED Final   Proteus species NOT DETECTED NOT DETECTED Final   Serratia marcescens NOT DETECTED NOT DETECTED Final   Haemophilus influenzae NOT DETECTED NOT DETECTED Final   Neisseria meningitidis NOT DETECTED NOT DETECTED Final   Pseudomonas aeruginosa NOT DETECTED NOT DETECTED Final   Candida albicans NOT DETECTED NOT DETECTED Final   Candida glabrata NOT DETECTED NOT DETECTED Final   Candida krusei NOT DETECTED NOT DETECTED Final   Candida parapsilosis NOT DETECTED NOT DETECTED Final   Candida tropicalis NOT DETECTED NOT DETECTED Final    Comment: Performed  at [facility], [address], [place], [place] [address]  Culture, blood (Routine X 2) w Reflex to ID Panel     Status: Abnormal   Collection Time: 11/09/16  2:58 AM  Result Value Ref Range Status   Specimen Description BLOOD LEFT ANTECUBITAL  Final   Special Requests   Final    BOTTLES DRAWN AEROBIC ONLY Blood Culture adequate volume   Culture  Setup Time   Final    GRAM POSITIVE COCCI IN CLUSTERS AEROBIC BOTTLE ONLY CRITICAL RESULT CALLED TO, READ BACK BY AND VERIFIED WITH: PHARMD M BELL [id number] MLM    Culture (A)  Final    STAPHYLOCOCCUS AUREUS SUSCEPTIBILITIES PERFORMED ON PREVIOUS CULTURE WITHIN THE LAST 5 DAYS. Performed at [facility], [address], [place], [place] [address]    Report Status 11/10/2016 FINAL  Final  Body fluid culture     Status: None   Collection Time: 11/09/16  4:12 AM  Result Value Ref Range Status   Specimen Description SYNOVIAL  Final   Special Requests NONE  Final   Gram Stain   Final    ABUNDANT WBC PRESENT,BOTH PMN AND MONONUCLEAR NO ORGANISMS SEEN    Culture   Final    NO GROWTH 3 DAYS INTERPRET RESULTS WITH CAUTION DUE TO LIMITED SPECIMEN VOLUME Performed at [facility], [address] [place], [place] [address]    Report Status 11/12/2016 FINAL  Final  Culture, blood (Routine X 2) w Reflex to ID Panel     Status: Abnormal   Collection Time: 11/09/16  7:44 AM  Result Value Ref Range Status   Specimen Description BLOOD RIGHT ARM  Final   Special Requests IN PEDIATRIC BOTTLE Blood Culture adequate volume  Final   Culture  Setup Time   Final    GRAM POSITIVE COCCI IN CLUSTERS IN PEDIATRIC BOTTLE CRITICAL RESULT CALLED TO, READ BACK BY AND VERIFIED WITH: G.ABBOTT, PHARMD 11/10/16 0017 L.CHAMPION    Culture (A)  Final    STAPHYLOCOCCUS AUREUS SUSCEPTIBILITIES PERFORMED ON PREVIOUS CULTURE WITHIN THE LAST 5 DAYS. Performed at [facility], [address] [place], [place] [address]    Report Status  11/11/2016 FINAL  Final  Urine Culture     Status: Abnormal   Collection Time: 11/10/16 10:52 AM  Result Value Ref Range  Status   Specimen Description URINE, RANDOM  Final   Special Requests NONE  Final   Culture <10,000 COLONIES/mL INSIGNIFICANT GROWTH (A)  Final   Report Status 11/11/2016 FINAL  Final  Culture, blood (Routine X 2) w Reflex to ID Panel     Status: Abnormal   Collection Time: 11/11/16  6:44 PM  Result Value Ref Range Status   Specimen Description BLOOD LEFT HAND  Final   Special Requests   Final    BOTTLES DRAWN AEROBIC AND ANAEROBIC Blood Culture adequate volume   Culture  Setup Time   Final    GRAM POSITIVE COCCI IN CLUSTERS ANAEROBIC BOTTLE ONLY CRITICAL VALUE NOTED.  VALUE IS CONSISTENT WITH PREVIOUSLY REPORTED AND CALLED VALUE.    Culture (A)  Final    STAPHYLOCOCCUS AUREUS SUSCEPTIBILITIES PERFORMED ON PREVIOUS CULTURE WITHIN THE LAST 5 DAYS.    Report Status 11/14/2016 FINAL  Final  Culture, blood (Routine X 2) w Reflex to ID Panel     Status: Abnormal   Collection Time: 11/11/16  6:44 PM  Result Value Ref Range Status   Specimen Description BLOOD LEFT ARM  Final   Special Requests IN PEDIATRIC BOTTLE Blood Culture adequate volume  Final   Culture  Setup Time   Final    GRAM POSITIVE COCCI IN CLUSTERS IN PEDIATRIC BOTTLE Organism ID to follow CRITICAL RESULT CALLED TO, READ BACK BY AND VERIFIED WITH: H BAIRD 11/12/16 @ 1326 M VESTAL    Culture STAPHYLOCOCCUS AUREUS (A)  Final   Report Status 11/14/2016 FINAL  Final   Organism ID, Bacteria STAPHYLOCOCCUS AUREUS  Final      Susceptibility   Staphylococcus aureus - MIC*    CIPROFLOXACIN <=0.5 SENSITIVE Sensitive     ERYTHROMYCIN >=8 RESISTANT Resistant     GENTAMICIN <=0.5 SENSITIVE Sensitive     OXACILLIN 0.5 SENSITIVE Sensitive     TETRACYCLINE <=1 SENSITIVE Sensitive     VANCOMYCIN <=0.5 SENSITIVE Sensitive     TRIMETH/SULFA <=10 SENSITIVE Sensitive     CLINDAMYCIN RESISTANT Resistant      RIFAMPIN <=0.5 SENSITIVE Sensitive     Inducible Clindamycin POSITIVE Resistant     * STAPHYLOCOCCUS AUREUS  Blood Culture ID Panel (Reflexed)     Status: Abnormal   Collection Time: 11/11/16  6:44 PM  Result Value Ref Range Status   Enterococcus species NOT DETECTED NOT DETECTED Final   Vancomycin resistance NOT DETECTED NOT DETECTED Final   Listeria monocytogenes NOT DETECTED NOT DETECTED Final   Staphylococcus species DETECTED (A) NOT DETECTED Final    Comment: CRITICAL RESULT CALLED TO, READ BACK BY AND VERIFIED WITH: H BAIRD 11/12/16 @ 1326 M VESTAL    Staphylococcus aureus DETECTED (A) NOT DETECTED Final    Comment: CRITICAL RESULT CALLED TO, READ BACK BY AND VERIFIED WITH: H BAIRD 11/12/16 @ 1326 M VESTAL    Methicillin resistance NOT DETECTED NOT DETECTED Final   Streptococcus species NOT DETECTED NOT DETECTED Final   Streptococcus agalactiae NOT DETECTED NOT DETECTED Final   Streptococcus pneumoniae NOT DETECTED NOT DETECTED Final   Streptococcus pyogenes NOT DETECTED NOT DETECTED Final   Acinetobacter baumannii NOT DETECTED NOT DETECTED Final   Enterobacteriaceae species NOT DETECTED NOT DETECTED Final   Enterobacter cloacae complex NOT DETECTED NOT DETECTED Final   Escherichia coli NOT DETECTED NOT DETECTED Final   Klebsiella oxytoca NOT DETECTED NOT DETECTED Final   Klebsiella pneumoniae NOT DETECTED NOT DETECTED Final   Proteus species NOT DETECTED NOT DETECTED Final  Serratia marcescens NOT DETECTED NOT DETECTED Final   Carbapenem resistance NOT DETECTED NOT DETECTED Final   Haemophilus influenzae NOT DETECTED NOT DETECTED Final   Neisseria meningitidis NOT DETECTED NOT DETECTED Final   Pseudomonas aeruginosa NOT DETECTED NOT DETECTED Final   Candida albicans NOT DETECTED NOT DETECTED Final   Candida glabrata NOT DETECTED NOT DETECTED Final   Candida krusei NOT DETECTED NOT DETECTED Final   Candida parapsilosis NOT DETECTED NOT DETECTED Final   Candida tropicalis  NOT DETECTED NOT DETECTED Final  Culture, blood (Routine X 2) w Reflex to ID Panel     Status: None (Preliminary result)   Collection Time: 11/12/16  4:30 PM  Result Value Ref Range Status   Specimen Description BLOOD LEFT ARM  Final   Special Requests   Final    BOTTLES DRAWN AEROBIC AND ANAEROBIC Blood Culture results may not be optimal due to an excessive volume of blood received in culture bottles   Culture NO GROWTH 2 DAYS  Final   Report Status PENDING  Incomplete  Culture, blood (Routine X 2) w Reflex to ID Panel     Status: None (Preliminary result)   Collection Time: 11/12/16  4:42 PM  Result Value Ref Range Status   Specimen Description BLOOD LEFT WRIST  Final   Special Requests   Final    BOTTLES DRAWN AEROBIC AND ANAEROBIC Blood Culture results may not be optimal due to an excessive volume of blood received in culture bottles   Culture NO GROWTH 2 DAYS  Final   Report Status PENDING  Incomplete    Lorenso Courier, MD [facility] [phone] pager   [phone] cell 11/15/2016, 8:30 AM

## 2016-11-16 ENCOUNTER — Inpatient Hospital Stay (HOSPITAL_COMMUNITY): Payer: Self-pay

## 2016-11-16 DIAGNOSIS — I361 Nonrheumatic tricuspid (valve) insufficiency: Secondary | ICD-10-CM

## 2016-11-16 DIAGNOSIS — I339 Acute and subacute endocarditis, unspecified: Secondary | ICD-10-CM

## 2016-11-16 LAB — BASIC METABOLIC PANEL
ANION GAP: 8 (ref 5–15)
BUN: 14 mg/dL (ref 6–20)
CALCIUM: 8.4 mg/dL — AB (ref 8.9–10.3)
CHLORIDE: 93 mmol/L — AB (ref 101–111)
CO2: 28 mmol/L (ref 22–32)
Creatinine, Ser: 0.82 mg/dL (ref 0.61–1.24)
GFR calc non Af Amer: >60 mL/min (ref >60)
Glucose, Bld: 110 mg/dL — ABNORMAL HIGH (ref 65–99)
Potassium: 4.6 mmol/L (ref 3.5–5.1)
SODIUM: 129 mmol/L — AB (ref 135–145)

## 2016-11-16 LAB — CBC
HEMATOCRIT: 28.2 % — AB (ref 39.0–52.0)
HEMOGLOBIN: 9.5 g/dL — AB (ref 13.0–17.0)
MCH: 27.9 pg (ref 26.0–34.0)
MCHC: 33.7 g/dL (ref 30.0–36.0)
MCV: 82.9 fL (ref 78.0–100.0)
Platelets: 373 10*3/uL (ref 150–400)
RBC: 3.4 MIL/uL — ABNORMAL LOW (ref 4.22–5.81)
RDW: 14.3 % (ref 11.5–15.5)
WBC: 14 10*3/uL — AB (ref 4.0–10.5)

## 2016-11-16 MED ORDER — DIPHENHYDRAMINE HCL 25 MG PO CAPS
25.0000 mg | ORAL_CAPSULE | Freq: Four times a day (QID) | ORAL | Status: DC | PRN
  Administered 2016-11-16 – 2016-11-17 (×3): 25 mg via ORAL
  Filled 2016-11-16 (×3): qty 1

## 2016-11-16 NOTE — Progress Notes (Signed)
      INFECTIOUS DISEASE ATTENDING ADDENDUM:   Date: 11/16/2016  Patient name: Cody Rios  Medical record number: [id number]  Date of birth: 10-15-1990    This patient has been seen and discussed with the house staff. Please see the resident's note for complete details. I concur with their findings with the following additions/corrections:  Yesterday when I examined Cody Rios and he developed an intensely pruritic rash and I stopped his vancomycin and started oral Zyvox. Rash does persist today and is still quite pruritic. Is not have much in the way of complaints besides the severe pleurisy and difficulty sleeping and lying down due to the chest pain when he takes a deep breath.  On exam he has a regular rate and rhythm without new murmurs. His lungs are fairly clear to auscultation throughout.  Skin exam  See pics  11/15/16:     11/16/16:         I am checking CXR 2 V to evaluate his lungs again  We need CT surgery to see him and render opinion on whether they would consider for valve replacement.  Not clear what is going on with this rash I assume it is due to something like the vancomycin that might still be in his system but one may need to consider different culprit.  Greatly appreciate multidisciplinary care he is receiving.  If he does not end up undergoing cardiothoracic surgery his preference would be to go home on oral Zyvox.  If the latter is the case pharmacy can work to obtain Zyvox for him.  Dr. Orvan Falconerampbell will take over the service tomorrow.     Acey LavCornelius Van Dam 11/16/2016, 12:07 PM

## 2016-11-16 NOTE — Progress Notes (Signed)
[facility]  PROGRESS NOTE  Cody Rios ZOX:[id number] DOB: 09-Oct-1990 DOA: 11/10/2016 PCP: System, [name]   Brief HPI:    26 y.o. male   IVDA w/ known MSSA bacteremia,  endocarditis w/ multifocal pneumonia   septic pulmonary emboli.   [facility] the day prior AMA.  Patient was called at home for MSSA  bacteremia and he came back to hospital.  Patient was started on IV vancomycin, as he has allergy to cephalosporin, penicillin. He underwent TEE which confirmed multiple vegetations on tricuspid valve with moderate to severe tricuspid regurgitation. Patient has been started on Suboxone by internal medicine clinic.  Subjective   Patient grimacing as if in pain On passing the room earlier patient was up and about and did not seem to be in any distress Tolerating diet States that pain is central chest I've told him that his x-ray does not show any significant changes and he understands    Assessment/Plan:     Tricuspid valve endocarditis/septic pulmonary emboli-  CT chest shows bilateral multifocal pneumonia likely septic emboli from right side endocarditis.  TTE shows vegetation on tricuspid valve with significant tricuspid regurgitation and high PA pressure.  Blood cultures x 2  positive for MSSA.  Patient is currently on vancomycin. ID and Cardiology  are following.  TEE Confirmed multiple vegetations on tricuspid valve with moderate to severe regurgitation.  CT surgery Consult appreciated and agree that no specific workup or surgery is indicated in his case MSSA bacteremia-  blood cultures on 11/09/2016 showed MSSA bacteremia in 2 out of 2 bottles.  Patient currently on vancomycin per pharmacy. I ID plans to discharge patient on Zyvox as early as 11/17/2016 and we will ask your management to help with antibiotics Right ankle osteomyelitis- MRI of the right ankle shows osteomyelitis, continue antibiotics as above. Orthopedic surgery was consulted, and  recommended to continue with antibiotics. No surgical intervention at this time. 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 Continue Toradol 15 mg IV every 6 hours Will change to ibuprofen on discharge 400 3 times a day  Heroine abuse-  patient has a history of IV drug abuse, and is active heroin abuser.  He has been started on Suboxone by internal medicine clinic attending [name].  Will continue with Suboxone,Bentyl, Catapres tapering dose to help with withdrawal symptoms. The patient is somewhat dramatic in terms of his presentation and states he's having chest pain His vitals are stable when I examined him  Tenosynovitis of right wrist- tenosynovitis likely infectious confirmed on MRI 11/09/2016. Continue antibiotics as above.  History of juvenile rheumatoid arthritis- patient has been taking sulfasalazine for 2-3 years but stopped because he didn't think it was helping.  Anemia- hemoglobin is stable at 9.2, likely due to malnutrition in setting of IV drug abuse. Iron level 42, saturation 16%, ferritin 276, B12 1379, folate 18.2.    DVT prophylaxis: Lovenox  Code Status: Full code  Family Communication: No family present at bedside  Disposition Plan: Pending CT surgery recommendation   Consultants:  Infectious disease   Procedures:  None  Continuous infusions     Antibiotics:   Anti-infectives    Start     Dose/Rate Route Frequency Ordered Stop   11/15/16 2200  linezolid (ZYVOX) tablet 600 mg     600 mg Oral Every 12 hours 11/15/16 1719  11/14/16 1000  vancomycin (VANCOCIN) 1,250 mg in sodium chloride 0.9 % 250 mL IVPB  Status:  Discontinued     1,250 mg 166.7 mL/hr over 90 Minutes Intravenous Every 6 hours 11/14/16 0914 11/15/16 1719   11/12/16 2200  vancomycin (VANCOCIN) 1,500 mg in sodium  chloride 0.9 % 500 mL IVPB  Status:  Discontinued     1,500 mg 250 mL/hr over 120 Minutes Intravenous Every 8 hours 11/12/16 1255 11/12/16 1346   11/12/16 2200  vancomycin (VANCOCIN) 1,250 mg in sodium chloride 0.9 % 250 mL IVPB  Status:  Discontinued     1,250 mg 166.7 mL/hr over 90 Minutes Intravenous Every 8 hours 11/12/16 1346 11/14/16 0914   11/12/16 1300  vancomycin (VANCOCIN) 1,750 mg in sodium chloride 0.9 % 500 mL IVPB     1,750 mg 250 mL/hr over 120 Minutes Intravenous NOW 11/12/16 1255 11/12/16 1533   11/10/16 1400  aztreonam (AZACTAM) 2 g in dextrose 5 % 50 mL IVPB  Status:  Discontinued     2 g 100 mL/hr over 30 Minutes Intravenous Every 8 hours 11/10/16 0740 11/10/16 0912   11/10/16 1400  vancomycin (VANCOCIN) IVPB 1000 mg/200 mL premix  Status:  Discontinued     1,000 mg 200 mL/hr over 60 Minutes Intravenous Every 8 hours 11/10/16 0740 11/12/16 1255   11/10/16 0730  aztreonam (AZACTAM) 2 g in dextrose 5 % 50 mL IVPB     2 g 100 mL/hr over 30 Minutes Intravenous  Once 11/10/16 0720 11/10/16 0930   11/10/16 0730  vancomycin (VANCOCIN) IVPB 1000 mg/200 mL premix  Status:  Discontinued     1,000 mg 200 mL/hr over 60 Minutes Intravenous  Once 11/10/16 0720 11/10/16 0726   11/10/16 0030  vancomycin (VANCOCIN) IVPB 1000 mg/200 mL premix     1,000 mg 200 mL/hr over 60 Minutes Intravenous  Once 11/10/16 0027 11/10/16 0756       Objective   Vitals:   11/15/16 1711 11/15/16 2100 11/16/16 0602 11/16/16 1400  BP: (!) 91/57 117/72 (!) 110/56 110/73  Pulse: 88 (!) 101 (!) 106 88  Resp: 20 20 20 20   Temp: 98 F (36.7 C) 98 F (36.7 C) (!) 100.6 F (38.1 C) (!) 97.3 F (36.3 C)  TempSrc: Oral Oral Oral Oral  SpO2: 98% 98% 98% 98%  Weight:   72.5 kg (159 lb 14.4 oz)   Height:        Intake/Output Summary (Last 24 hours) at 11/16/16 1646 Last data filed at 11/16/16 0900  Gross per 24 hour  Intake              480 ml  Output                0 ml  Net              480 ml    Filed Weights   11/14/16 1136 11/15/16 0421 11/16/16 0602  Weight: 72.1 kg (159 lb) 73.5 kg (162 lb 0.6 oz) 72.5 kg (159 lb 14.4 oz)     Physical Examination:   Alert oriented and seems to be in distress No other issue Extraocular movements intact S1-S2 no murmur rub or gallop sinus rhythm Chest clinically clear no added sound Abdomen soft nontender no rebound Neurologically intact His skin shows some petechial areas over the flexor and extensor aspect of mainly the acral areas The worst part of the rash is over his right upper extremity on the flexor and extensor aspect  and is more confluent macular it does not blanch and it is not scaly   Data Reviewed: I have personally reviewed following labs and imaging studies  CBG: No results for input(s): GLUCAP in the last 168 hours.  CBC:  Recent Labs Lab 11/12/16 0545 11/13/16 0324 11/14/16 0637 11/15/16 0217 11/16/16 0704  WBC 12.4* 11.8* 12.8* 12.5* 14.0*  HGB 11.0* 10.1* 9.9* 9.2* 9.5*  HCT 31.1* 30.3* 29.2* 27.8* 28.2*  MCV 83.2 83.5 83.2 84.2 82.9  PLT 371 422* 335 311 373    Basic Metabolic Panel:  Recent Labs Lab 11/10/16 1804 11/11/16 0250 11/12/16 0545 11/14/16 0637 11/15/16 0217 11/16/16 0704  NA  --  134* 136 133* 132* 129*  K  --  3.4* 4.9 4.2 5.0 4.6  CL  --  102 102 98* 98* 93*  CO2  --  24 26 29 24 28   GLUCOSE  --  137* 91 112* 118* 110*  BUN  --  8 17 12 14 14   CREATININE  --  0.63 0.67 0.65 0.72 0.82  CALCIUM  --  7.8* 8.4* 8.2* 7.8* 8.4*  MG 1.9  --   --   --   --   --     Recent Results (from the past 240 hour(s))  Culture, blood (routine x 2)     Status: Abnormal   Collection Time: 11/08/16 12:15 AM  Result Value Ref Range Status   Specimen Description BLOOD RIGHT ARM  Final   Special Requests   Final    BOTTLES DRAWN AEROBIC AND ANAEROBIC Blood Culture adequate volume   Culture  Setup Time   Final    GRAM POSITIVE COCCI IN CLUSTERS IN BOTH AEROBIC AND ANAEROBIC  BOTTLES CRITICAL RESULT CALLED TO, READ BACK BY AND VERIFIED WITH: A. HARTLEY RN 11/18/16 1835 BEAMJ    Culture (A)  Final    STAPHYLOCOCCUS AUREUS SUSCEPTIBILITIES PERFORMED ON PREVIOUS CULTURE WITHIN THE LAST 5 DAYS. Performed at [facility], [address] [place], [place] [address]    Report Status 11/10/2016 FINAL  Final  Culture, blood (routine x 2)     Status: Abnormal   Collection Time: 11/08/16 12:18 AM  Result Value Ref Range Status   Specimen Description BLOOD BLOOD RIGHT HAND  Final   Special Requests IN PEDIATRIC BOTTLE Blood Culture adequate volume  Final   Culture  Setup Time   Final    GRAM POSITIVE COCCI IN CLUSTERS IN PEDIATRIC BOTTLE CRITICAL VALUE NOTED.  VALUE IS CONSISTENT WITH PREVIOUSLY REPORTED AND CALLED VALUE. Performed at [facility], [address], [place], [place] [address]    Culture STAPHYLOCOCCUS AUREUS (A)  Final   Report Status 11/10/2016 FINAL  Final   Organism ID, Bacteria STAPHYLOCOCCUS AUREUS  Final      Susceptibility   Staphylococcus aureus - MIC*    CIPROFLOXACIN <=0.5 SENSITIVE Sensitive     ERYTHROMYCIN >=8 RESISTANT Resistant     GENTAMICIN <=0.5 SENSITIVE Sensitive     OXACILLIN 0.5 SENSITIVE Sensitive     TETRACYCLINE <=1 SENSITIVE Sensitive     VANCOMYCIN <=0.5 SENSITIVE Sensitive     TRIMETH/SULFA <=10 SENSITIVE Sensitive     CLINDAMYCIN RESISTANT Resistant     RIFAMPIN <=0.5 SENSITIVE Sensitive     Inducible Clindamycin POSITIVE Resistant     * STAPHYLOCOCCUS AUREUS  Blood Culture ID Panel (Reflexed)     Status: Abnormal   Collection Time: 11/08/16 12:18 AM  Result Value Ref Range Status  Enterococcus species NOT DETECTED NOT DETECTED Final   Listeria monocytogenes NOT DETECTED NOT DETECTED Final   Staphylococcus species DETECTED (A) NOT DETECTED Final    Comment: CRITICAL RESULT CALLED TO, READ BACK BY AND VERIFIED WITH: A. HARTLEY RN 11/08/16 1835 BEAMJ    Staphylococcus aureus DETECTED (A) NOT DETECTED  Final    Comment: Methicillin (oxacillin) susceptible Staphylococcus aureus (MSSA). Preferred therapy is anti staphylococcal beta lactam antibiotic (Cefazolin or Nafcillin), unless clinically contraindicated. CRITICAL RESULT CALLED TO, READ BACK BY AND VERIFIED WITH: A. HARTLEY RN 11/08/16 1835 BEAMJ    Methicillin resistance NOT DETECTED NOT DETECTED Final   Streptococcus species NOT DETECTED NOT DETECTED Final   Streptococcus agalactiae NOT DETECTED NOT DETECTED Final   Streptococcus pneumoniae NOT DETECTED NOT DETECTED Final   Streptococcus pyogenes NOT DETECTED NOT DETECTED Final   Acinetobacter baumannii NOT DETECTED NOT DETECTED Final   Enterobacteriaceae species NOT DETECTED NOT DETECTED Final   Enterobacter cloacae complex NOT DETECTED NOT DETECTED Final   Escherichia coli NOT DETECTED NOT DETECTED Final   Klebsiella oxytoca NOT DETECTED NOT DETECTED Final   Klebsiella pneumoniae NOT DETECTED NOT DETECTED Final   Proteus species NOT DETECTED NOT DETECTED Final   Serratia marcescens NOT DETECTED NOT DETECTED Final   Haemophilus influenzae NOT DETECTED NOT DETECTED Final   Neisseria meningitidis NOT DETECTED NOT DETECTED Final   Pseudomonas aeruginosa NOT DETECTED NOT DETECTED Final   Candida albicans NOT DETECTED NOT DETECTED Final   Candida glabrata NOT DETECTED NOT DETECTED Final   Candida krusei NOT DETECTED NOT DETECTED Final   Candida parapsilosis NOT DETECTED NOT DETECTED Final   Candida tropicalis NOT DETECTED NOT DETECTED Final    Comment: Performed at [facility], [address], [place], [place] [address]  Culture, blood (Routine X 2) w Reflex to ID Panel     Status: Abnormal   Collection Time: 11/09/16  2:58 AM  Result Value Ref Range Status   Specimen Description BLOOD LEFT ANTECUBITAL  Final   Special Requests   Final    BOTTLES DRAWN AEROBIC ONLY Blood Culture adequate volume   Culture  Setup Time   Final    GRAM POSITIVE COCCI IN CLUSTERS AEROBIC BOTTLE  ONLY CRITICAL RESULT CALLED TO, READ BACK BY AND VERIFIED WITH: PHARMD M BELL [id number] MLM    Culture (A)  Final    STAPHYLOCOCCUS AUREUS SUSCEPTIBILITIES PERFORMED ON PREVIOUS CULTURE WITHIN THE LAST 5 DAYS. Performed at [facility], [address] [place], [place] [address]    Report Status 11/10/2016 FINAL  Final  Body fluid culture     Status: None   Collection Time: 11/09/16  4:12 AM  Result Value Ref Range Status   Specimen Description SYNOVIAL  Final   Special Requests NONE  Final   Gram Stain   Final    ABUNDANT WBC PRESENT,BOTH PMN AND MONONUCLEAR NO ORGANISMS SEEN    Culture   Final    NO GROWTH 3 DAYS INTERPRET RESULTS WITH CAUTION DUE TO LIMITED SPECIMEN VOLUME Performed at [facility], [address], [place], [place] [address]    Report Status 11/12/2016 FINAL  Final  Culture, blood (Routine X 2) w Reflex to ID Panel     Status: Abnormal   Collection Time: 11/09/16  7:44 AM  Result Value Ref Range Status   Specimen Description BLOOD RIGHT ARM  Final   Special Requests IN PEDIATRIC BOTTLE Blood Culture adequate volume  Final  Culture  Setup Time   Final    GRAM POSITIVE COCCI IN CLUSTERS IN PEDIATRIC BOTTLE CRITICAL RESULT CALLED TO, READ BACK BY AND VERIFIED WITH: G.ABBOTT, PHARMD 11/10/16 0017 L.CHAMPION    Culture (A)  Final    STAPHYLOCOCCUS AUREUS SUSCEPTIBILITIES PERFORMED ON PREVIOUS CULTURE WITHIN THE LAST 5 DAYS. Performed at [facility], [address], [place], [place] [address]    Report Status 11/11/2016 FINAL  Final  Urine Culture     Status: Abnormal   Collection Time: 11/10/16 10:52 AM  Result Value Ref Range Status   Specimen Description URINE, RANDOM  Final   Special Requests NONE  Final   Culture <10,000 COLONIES/mL INSIGNIFICANT GROWTH (A)  Final   Report Status 11/11/2016 FINAL  Final  Culture, blood (Routine X 2) w Reflex to ID Panel     Status: Abnormal   Collection Time: 11/11/16  6:44 PM  Result Value Ref  Range Status   Specimen Description BLOOD LEFT HAND  Final   Special Requests   Final    BOTTLES DRAWN AEROBIC AND ANAEROBIC Blood Culture adequate volume   Culture  Setup Time   Final    GRAM POSITIVE COCCI IN CLUSTERS ANAEROBIC BOTTLE ONLY CRITICAL VALUE NOTED.  VALUE IS CONSISTENT WITH PREVIOUSLY REPORTED AND CALLED VALUE.    Culture (A)  Final    STAPHYLOCOCCUS AUREUS SUSCEPTIBILITIES PERFORMED ON PREVIOUS CULTURE WITHIN THE LAST 5 DAYS.    Report Status 11/14/2016 FINAL  Final  Culture, blood (Routine X 2) w Reflex to ID Panel     Status: Abnormal   Collection Time: 11/11/16  6:44 PM  Result Value Ref Range Status   Specimen Description BLOOD LEFT ARM  Final   Special Requests IN PEDIATRIC BOTTLE Blood Culture adequate volume  Final   Culture  Setup Time   Final    GRAM POSITIVE COCCI IN CLUSTERS IN PEDIATRIC BOTTLE Organism ID to follow CRITICAL RESULT CALLED TO, READ BACK BY AND VERIFIED WITH: H BAIRD 11/12/16 @ 1326 M VESTAL    Culture STAPHYLOCOCCUS AUREUS (A)  Final   Report Status 11/14/2016 FINAL  Final   Organism ID, Bacteria STAPHYLOCOCCUS AUREUS  Final      Susceptibility   Staphylococcus aureus - MIC*    CIPROFLOXACIN <=0.5 SENSITIVE Sensitive     ERYTHROMYCIN >=8 RESISTANT Resistant     GENTAMICIN <=0.5 SENSITIVE Sensitive     OXACILLIN 0.5 SENSITIVE Sensitive     TETRACYCLINE <=1 SENSITIVE Sensitive     VANCOMYCIN <=0.5 SENSITIVE Sensitive     TRIMETH/SULFA <=10 SENSITIVE Sensitive     CLINDAMYCIN RESISTANT Resistant     RIFAMPIN <=0.5 SENSITIVE Sensitive     Inducible Clindamycin POSITIVE Resistant     * STAPHYLOCOCCUS AUREUS  Blood Culture ID Panel (Reflexed)     Status: Abnormal   Collection Time: 11/11/16  6:44 PM  Result Value Ref Range Status   Enterococcus species NOT DETECTED NOT DETECTED Final   Vancomycin resistance NOT DETECTED NOT DETECTED Final   Listeria monocytogenes NOT DETECTED NOT DETECTED Final   Staphylococcus species DETECTED (A)  NOT DETECTED Final    Comment: CRITICAL RESULT CALLED TO, READ BACK BY AND VERIFIED WITH: H BAIRD 11/12/16 @ 1326 M VESTAL    Staphylococcus aureus DETECTED (A) NOT DETECTED Final    Comment: CRITICAL RESULT CALLED TO, READ BACK BY AND VERIFIED WITH: H BAIRD 11/12/16 @ 1326 M VESTAL    Methicillin resistance NOT DETECTED NOT DETECTED Final  Streptococcus species NOT DETECTED NOT DETECTED Final   Streptococcus agalactiae NOT DETECTED NOT DETECTED Final   Streptococcus pneumoniae NOT DETECTED NOT DETECTED Final   Streptococcus pyogenes NOT DETECTED NOT DETECTED Final   Acinetobacter baumannii NOT DETECTED NOT DETECTED Final   Enterobacteriaceae species NOT DETECTED NOT DETECTED Final   Enterobacter cloacae complex NOT DETECTED NOT DETECTED Final   Escherichia coli NOT DETECTED NOT DETECTED Final   Klebsiella oxytoca NOT DETECTED NOT DETECTED Final   Klebsiella pneumoniae NOT DETECTED NOT DETECTED Final   Proteus species NOT DETECTED NOT DETECTED Final   Serratia marcescens NOT DETECTED NOT DETECTED Final   Carbapenem resistance NOT DETECTED NOT DETECTED Final   Haemophilus influenzae NOT DETECTED NOT DETECTED Final   Neisseria meningitidis NOT DETECTED NOT DETECTED Final   Pseudomonas aeruginosa NOT DETECTED NOT DETECTED Final   Candida albicans NOT DETECTED NOT DETECTED Final   Candida glabrata NOT DETECTED NOT DETECTED Final   Candida krusei NOT DETECTED NOT DETECTED Final   Candida parapsilosis NOT DETECTED NOT DETECTED Final   Candida tropicalis NOT DETECTED NOT DETECTED Final  Culture, blood (Routine X 2) w Reflex to ID Panel     Status: None (Preliminary result)   Collection Time: 11/12/16  4:30 PM  Result Value Ref Range Status   Specimen Description BLOOD LEFT ARM  Final   Special Requests   Final    BOTTLES DRAWN AEROBIC AND ANAEROBIC Blood Culture results may not be optimal due to an excessive volume of blood received in culture bottles   Culture NO GROWTH 4 DAYS  Final    Report Status PENDING  Incomplete  Culture, blood (Routine X 2) w Reflex to ID Panel     Status: None (Preliminary result)   Collection Time: 11/12/16  4:42 PM  Result Value Ref Range Status   Specimen Description BLOOD LEFT WRIST  Final   Special Requests   Final    BOTTLES DRAWN AEROBIC AND ANAEROBIC Blood Culture results may not be optimal due to an excessive volume of blood received in culture bottles   Culture NO GROWTH 4 DAYS  Final   Report Status PENDING  Incomplete     Liver Function Tests:  Recent Labs Lab 11/11/16 0250 11/12/16 0545  AST 32 35  ALT 45 43  ALKPHOS 217* 199*  BILITOT 1.1 1.4*  PROT 5.3* 6.3*  ALBUMIN 2.1* 2.3*   No results for input(s): LIPASE, AMYLASE in the last 168 hours. No results for input(s): AMMONIA in the last 168 hours.  Cardiac Enzymes: No results for input(s): CKTOTAL, CKMB, CKMBINDEX, TROPONINI in the last 168 hours. BNP (last 3 results)  Recent Labs  11/09/16 0258  BNP 139.6*    ProBNP (last 3 results) No results for input(s): PROBNP in the last 8760 hours.    Studies: Dg Chest 2 View  Result Date: 11/16/2016 CLINICAL DATA:  Endocarditis. Persistent chest pain shortness of breath. EXAM: CHEST  2 VIEW COMPARISON:  CT 11/10/2016.  Plain film of 11/09/2016. FINDINGS: Midline trachea. Normal heart size. Trace left pleural fluid. No pneumothorax. Patchy bilateral pulmonary opacities. Cavitary lesion in the right upper lobe is grossly similar to the prior CT, given cross modality comparison. There is also a a lingular lesion with cavitation which is grossly similar. Minimal cavitation within the superior segment right lower lobe lesion. Patchy bibasilar airspace disease also seen. Left apical area of consolidation demonstrates probable new cavitation. IMPRESSION: Given cross modality comparison, relatively similar appearance of multifocal pneumonia and septic  emboli. This is progressive compared 11/09/2016 radiograph. Small left pleural  effusion. Electronically Signed   By: Jeronimo GreavesKyle  Talbot M.D.   On: 11/16/2016 12:20    Scheduled Meds: . buprenorphine-naloxone  1 tablet Sublingual BID  . enoxaparin (LOVENOX) injection  40 mg Subcutaneous Q24H  . linezolid  600 mg Oral Q12H  . pantoprazole  40 mg Oral Daily  . sodium chloride flush  3 mL Intravenous Q12H      Time spent: 20 min  Mahala MenghiniSAMTANI, [facility] [id number]   [facility] Pager [phone]. If 7PM-7AM, please contact night-coverage at [email or web address], Office  [phone]  password [email or web address]  11/16/2016, 4:46 PM  LOS: 6 days

## 2016-11-16 NOTE — Consult Note (Signed)
[address]       [place] [address]             [phone]        NABOR THOMANN [facility] Medical Record #[id number] Date of Birth: [birth year]  Referring: Dr. Zenaida Niece [facility]: System, [name]  Chief Complaint:    Chief Complaint  Patient presents with  . Chest Pain    History of Present Illness:      Mr. 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  The patient developed complaints of intermittent sharp chest pain across his left chest and shortness of breath about 3 weeks ago.  He developed full body aches at which time he presented to [facility] ED for evaluation.  He was give azithromycin and discharged.  He again presented to the ED 1 week ago with complaints of fever, chills, and worsening discomfort in his chest which also was present in his back, knees, and wrist.  He started a workup for endocarditis with blood cultures being obtained.  He declined admission at that time.  The blood culture results came back positive and the patient was contacted and informed he could have bacterial endocarditis and was encouraged to return to the hospital for admission.  He was agreeable to this and got re-admitted on 11/09/2016.  He was started on antibiotics and infectious disease consult was obtained.  CT scan was obtained and showed bilateral pulmonary emboli consistent with associated cavitary pneumonia and septic emboli.  He was found to have Tenosynovitis of his wrist and knee.  Orthopedics was consulted for this and no surgical intervention was indicated.  Cardiology consult was obtained and Echocardiogram was ordered.  This showed a vegetation on the patient's tricuspid valve.  There was also moderate tricuspid valve regurgitation.  They did not feel surgical intervention was required.  We have been consulted for possible surgical intervention.  Currently the patient complains of  continued chest discomfort, worse when attempting to take a deep breath and lay flat.  He also complains of an itchy rash, that was attributed to vancomycin and he has been transitioned to Zyvox per ID.    Current Activity/ Functional Status: Patient is independent with mobility/ambulation, transfers, ADL's, IADL's.   Past Medical History:  Diagnosis Date  . Endocarditis   . Hepatitis C   . Pneumonia 11/10/2016    Past Surgical History:  Procedure Laterality Date  . HERNIA REPAIR    . KNEE SURGERY    . TEE WITHOUT CARDIOVERSION N/A 11/14/2016   Procedure: TRANSESOPHAGEAL ECHOCARDIOGRAM (TEE);  Surgeon: Elease Hashimoto Deloris Ping, MD;  Location: [facility] ENDOSCOPY;  Service: Cardiovascular;  Laterality: N/A;    History  Smoking Status  . Never Smoker  Smokeless Tobacco  . Current User  . Types: Chew    History  Alcohol Use No    Social History   Social History  . Marital status: Single    Spouse name: N/A  . Number of children: N/A  . Years of education: N/A   Occupational History  . Not on file.   Social History Main Topics  . Smoking status: Never Smoker  . Smokeless tobacco: Current User    Types: Chew  . Alcohol use No  . Drug use: Yes    Types: IV, Heroin     Comment: Last used: 22:00   . Sexual activity: Not on  file   Other Topics Concern  . Not on file   Social History Narrative  . No narrative on file    Allergies  Allergen Reactions  . Cephalosporins Hives  . Penicillins Hives, Shortness Of Breath and Rash    Current Facility-Administered Medications  Medication Dose Route Frequency Provider Last Rate Last Dose  . acetaminophen (TYLENOL) tablet 650 mg  650 mg Oral Q6H PRN Russella Dar, NP   650 mg at 11/16/16 0848   Or  . acetaminophen (TYLENOL) suppository 650 mg  650 mg Rectal Q6H PRN Russella Dar, NP      . buprenorphine-naloxone (SUBOXONE) 8-2 mg per SL tablet 1 tablet  1 tablet Sublingual BID [name], MD   1 tablet at 11/16/16  [phone]  . diphenhydrAMINE (BENADRYL) capsule 25 mg  25 mg Oral Q8H PRN Meredeth Ide, MD   25 mg at 11/16/16 [phone]  . enoxaparin (LOVENOX) injection 40 mg  40 mg Subcutaneous Q24H Russella Dar, NP   40 mg at 11/13/16 2135  . linezolid (ZYVOX) tablet 600 mg  600 mg Oral Q12H Daiva Eves, Lisette Grinder, MD   600 mg at 11/16/16 [phone]  . ondansetron (ZOFRAN) tablet 4 mg  4 mg Oral Q6H PRN Russella Dar, NP       Or  . ondansetron [facility]) injection 4 mg  4 mg Intravenous Q6H PRN Russella Dar, NP      . pantoprazole (PROTONIX) EC tablet 40 mg  40 mg Oral Daily Russella Dar, NP   40 mg at 11/16/16 8119  . sodium chloride flush (NS) 0.9 % injection 3 mL  3 mL Intravenous Q12H Russella Dar, NP   3 mL at 11/15/16 2202    Prescriptions Prior to Admission  Medication Sig Dispense Refill Last Dose  . azithromycin (ZITHROMAX) 250 MG tablet Take first 2 tablets together, then 1 every day until finished. (Patient not taking: Reported on 11/09/2016) 6 tablet 0 Completed Course at Unknown time  . ibuprofen (ADVIL,MOTRIN) 800 MG tablet Take 1 tablet (800 mg total) by mouth every 8 (eight) hours as needed. (Patient not taking: Reported on 11/10/2016) 21 tablet 0 Completed Course at Unknown time  . naproxen (NAPROSYN) 500 MG tablet Take 1 tablet (500 mg total) by mouth 2 (two) times daily. (Patient not taking: Reported on 11/10/2016) 30 tablet 0 Completed Course at Unknown time    Family History  Problem Relation Age of Onset  . Hypertension Father    Review of Systems:  Pertinent items are noted in HPI.     Cardiac Review of Systems: Y or N  Chest Pain [ y   ]  Resting SOB [   ] Exertional SOB  Cove.Etienne  ]  Orthopnea [  ]   Pedal Edema [ y  ]    Palpitations [  ] Syncope  [  ]   Presyncope [   ]  General Review of Systems: [Y] = yes [  ]=no Constitional: recent weight change [  ]; anorexia [  ]; fatigue [  ]; nausea [  ]; night sweats [  ]; fever Cove.Etienne  ]; or chills [  ]  Dental: poor dentition[ y ]; Last Dentist visit:   Eye : blurred vision [  ]; diplopia [   ]; vision changes [  ];  Amaurosis fugax[  ]; Resp: cough [  ];  wheezing[  ];  hemoptysis[ N ]; shortness of breath[  ]; paroxysmal nocturnal dyspnea[  ]; dyspnea on exertion[  ]; or orthopnea[ y ];  GI:  gallstones[  ], vomiting[  ];  dysphagia[  ]; melena[  ];  hematochezia [  ]; heartburn[  ];   Hx of  Colonoscopy[  ]; GU: kidney stones [  ]; hematuria[  ];   dysuria [  ];  nocturia[  ];  history of     obstruction [  ]; urinary frequency [  ]             Skin: rash, swelling[y  ];, hair loss[  ];  peripheral edema[  ];  or itching[y  ]; Musculosketetal: myalgias[  ];  joint swelling[  ];  joint erythema[  ];  joint pain[y  ];  back pain[ y ];  Heme/Lymph: bruising[  ];  bleeding[  ];  anemia[  ];  Neuro: TIA[  ];  headaches[  ];  stroke[  ];  vertigo[  ];  seizures[  ];   paresthesias[  ];  difficulty walking[  ];  Psych:depression[  ]; anxiety[  ];  Endocrine: diabetes[  ];  thyroid dysfunction[  ];  Immunizations: Flu [  ]; Pneumococcal[  ];  Other:  Physical Exam: BP (!) 110/56 (BP Location: Left Arm)   Pulse (!) 106   Temp (!) 100.6 F (38.1 C) (Oral)   Resp 20   Ht 6\' 2"  (1.88 m)   Wt 159 lb 14.4 oz (72.5 kg)   SpO2 98%   BMI 20.53 kg/m   General appearance: alert, cooperative and no distress Head: Normocephalic, without obvious abnormality, atraumatic Resp: clear to auscultation bilaterally Cardio: regular rate and rhythm GI: soft, non-tender; bowel sounds normal; no masses,  no organomegaly Extremities: mild edema, rash present bilateral upper and lower extremities Neurologic: Grossly normal  Diagnostic Studies & Laboratory data:     Recent Radiology Findings:   Dg Chest 2 View  Result Date: 11/16/2016 CLINICAL DATA:  Endocarditis. Persistent chest pain shortness of breath. EXAM: CHEST  2 VIEW COMPARISON:  CT 11/10/2016.  Plain film of 11/09/2016.  FINDINGS: Midline trachea. Normal heart size. Trace left pleural fluid. No pneumothorax. Patchy bilateral pulmonary opacities. Cavitary lesion in the right upper lobe is grossly similar to the prior CT, given cross modality comparison. There is also a a lingular lesion with cavitation which is grossly similar. Minimal cavitation within the superior segment right lower lobe lesion. Patchy bibasilar airspace disease also seen. Left apical area of consolidation demonstrates probable new cavitation. IMPRESSION: Given cross modality comparison, relatively similar appearance of multifocal pneumonia and septic emboli. This is progressive compared 11/09/2016 radiograph. Small left pleural effusion. Electronically Signed   By: Jeronimo Greaves M.D.   On: 11/16/2016 12:20     I have independently reviewed the above radiologic studies.  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 Please continue ABX per ID 2. IV drug abuse- patient is actively using at time of admission, would like re-infect any intervention performed surgically 3. Dispo- care per primary, will sign off.   I  spent 40 minutes counseling the patient face to face and 50% or more the  time was spent in counseling and coordination of care. The total time spent in the appointment was 55 minutes.    Lowella DandyBARRETT, ERIN, PA-C  11/16/2016 1:06 PM  I have seen and examined the patient and agree with the assessment and plan as outlined.  I have personally examined the  patient and reviewed his recent TTE and TEE.  There are no indications for surgical intervention at this time.  We agree w/ recommendations outlined by the cardiology team.  The patient's long term prognosis is likely to be determined by whether or not he can cure his problems with narcotic dependence/abuse, and not related to his tricuspid regurgitation.  Please call if questions arise.  Purcell Nailslarence H Owen, MD 11/16/2016 1:46 PM

## 2016-11-16 NOTE — Progress Notes (Signed)
[facility]  Date of Admission:  11/10/2016   Total days of antibiotics 8        Vancomycin 8/9-8/14        Day 2 Zyvox         ASSESSMENT and PLAN:  MSSA Bacteremia  Tricuspid valve Endocarditis ?Septic pulmonary embolism Patient has mssa bacteremia per [facility]fire 8/8 and blood culture collected 8/7 resulted for mssa. 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  Repeat blood cultures x 2 drawn 8/11 show ngtd  -Cardiology opted not to do any surgical intervention for tv endocarditis at this time due to patient's repeated ivdu. Will follow as outpatient.  The patient developed an erythematous maculopapular rash on his bilateral lower legs, feet, and elbow. The patient described the rash to be itchy. The rash has worsened from yesterday and is now larger in size and also present on the patient's abdomen. Vancomycin has been found to cause localized skin reactions especially when combined with narcotics (morphine), sedatives, radiocontrast agents. The vancomycin is likely still in the patient's system.  -Stopped Vancomycin 8/14 -Started Zyvox 8/14   -daily cbc to monitor for potential cytopenia -chest x-ray ordered today 8/15 to investigate pleuritic chest pain showed progressive changes compared to previous xray on 8/8. Multifocal pneumonia and septic emboli were seen.  Right Third Metatarsal Osteomyelitis MRI of right ankle and right foot showed presence of osteomyelitis in the proximal 3rd metatarsal shaft. The patient continues to have pain in his right 1st and 3rd digits.  -Per Ortho there is no indication for surgical procedure at this time.  -Continue vancomycin  Septic pulmonary embolism CT angio from 8/9 shows presence of bilateral septic emboli -continue vancomycin    IVDU Patient is tolerating suboxone therapy well. -continue 16mg  suboxone, clonidine, bentyl, loperamide, and zofran for symptomatic treatment   . buprenorphine-naloxone  1 tablet Sublingual BID  . enoxaparin (LOVENOX) injection  40 mg Subcutaneous Q24H  . linezolid  600 mg Oral Q12H  . pantoprazole  40 mg Oral Daily  . sodium chloride flush  3 mL Intravenous Q12H    SUBJECTIVE: Cody Rios was seen laying in his bed. He stated that he was having some right sided chest pain that is sharp in nature, positional, present all night, accompanied with sob. An ice pack placed on his side helped the ches pain. The patient had a temperature between 98-100 last night. The patient's itchy rash is also still present. The patient denies headache, abdominal pain, chills, and feelings of nervousness.   Review of Systems: ROS as above  Allergies  Allergen Reactions  . Cephalosporins Hives  . Penicillins Hives, Shortness Of Breath and Rash    OBJECTIVE: Vitals:   11/15/16 1300 11/15/16 1711 11/15/16 2100 11/16/16 0602  BP: 101/60 (!) 91/57 117/72 (!) 110/56  Pulse: 84 88 (!) 101 (!) 106  Resp: 18 20 20 20   Temp: 98.4 F (36.9 C) 98 F (36.7 C) 98 F (36.7 C) (!) 100.6 F (38.1 C)  TempSrc: Oral Oral Oral Oral  SpO2: 100% 98% 98% 98%  Weight:    159 lb 14.4 oz (72.5 kg)  Height:       Body mass index is 20.53 kg/m.  Physical Exam  Constitutional: He is well-developed, well-nourished, and in no distress. No distress.  HENT:  Head: Normocephalic and atraumatic.  Cardiovascular: Normal rate, regular rhythm, normal heart sounds and intact distal pulses.   Pulmonary/Chest: Breath sounds normal. No respiratory distress. He has no wheezes.  Abdominal: Soft. Bowel sounds are normal. There is no tenderness.  Erythematous rash present on abdomen  Musculoskeletal: He exhibits no tenderness (no tenderness in right wrist, right knee, and right foot).  Skin: Rash (present on abdomen,  elbows, bilateral feet and ankles) noted.  Psychiatric: Mood, memory, affect and judgment normal.    Lab Results Lab Results  Component Value Date   WBC 14.0 (H) 11/16/2016   HGB 9.5 (L) 11/16/2016   HCT 28.2 (L) 11/16/2016   MCV 82.9 11/16/2016   PLT 373 11/16/2016    Lab Results  Component Value Date   CREATININE 0.82 11/16/2016   BUN 14 11/16/2016   NA 129 (L) 11/16/2016   K 4.6 11/16/2016   CL 93 (L) 11/16/2016   CO2 28 11/16/2016    Lab Results  Component Value Date   ALT 43 11/12/2016   AST 35 11/12/2016   ALKPHOS 199 (H) 11/12/2016   BILITOT 1.4 (H) 11/12/2016     Microbiology: Recent Results (from the past 240 hour(s))  Culture, blood (routine x 2)     Status: Abnormal   Collection Time: 11/08/16 12:15 AM  Result Value Ref Range Status   Specimen Description BLOOD RIGHT ARM  Final   Special Requests   Final    BOTTLES DRAWN AEROBIC AND ANAEROBIC Blood Culture adequate volume   Culture  Setup Time   Final    GRAM POSITIVE COCCI IN CLUSTERS IN BOTH AEROBIC AND ANAEROBIC BOTTLES CRITICAL RESULT CALLED TO, READ BACK BY AND VERIFIED WITH: A. HARTLEY RN 11/18/16 1835 BEAMJ    Culture (A)  Final    STAPHYLOCOCCUS AUREUS SUSCEPTIBILITIES PERFORMED ON PREVIOUS CULTURE WITHIN THE LAST 5 DAYS. Performed at [facility], [address] [place], [place] [address]    Report Status 11/10/2016 FINAL  Final  Culture, blood (routine x 2)     Status: Abnormal   Collection Time: 11/08/16 12:18 AM  Result Value Ref Range Status   Specimen Description BLOOD BLOOD RIGHT HAND  Final   Special Requests IN PEDIATRIC BOTTLE Blood Culture adequate volume  Final   Culture  Setup Time   Final    GRAM POSITIVE COCCI IN CLUSTERS IN PEDIATRIC BOTTLE CRITICAL VALUE NOTED.  VALUE IS CONSISTENT WITH PREVIOUSLY REPORTED AND CALLED VALUE. Performed at [facility], [address], [place], [place] [address]    Culture STAPHYLOCOCCUS AUREUS (A)  Final   Report Status  11/10/2016 FINAL  Final   Organism ID, Bacteria STAPHYLOCOCCUS AUREUS  Final      Susceptibility   Staphylococcus aureus - MIC*    CIPROFLOXACIN <=0.5 SENSITIVE Sensitive     ERYTHROMYCIN >=8 RESISTANT Resistant     GENTAMICIN <=0.5 SENSITIVE Sensitive     OXACILLIN 0.5 SENSITIVE Sensitive     TETRACYCLINE <=1 SENSITIVE Sensitive     VANCOMYCIN <=0.5 SENSITIVE Sensitive     TRIMETH/SULFA <=10 SENSITIVE Sensitive     CLINDAMYCIN RESISTANT Resistant     RIFAMPIN <=0.5 SENSITIVE Sensitive     Inducible Clindamycin POSITIVE Resistant     * STAPHYLOCOCCUS AUREUS  Blood Culture ID Panel (Reflexed)     Status: Abnormal   Collection Time: 11/08/16 12:18 AM  Result Value Ref Range Status   Enterococcus species NOT DETECTED NOT DETECTED Final   Listeria  monocytogenes NOT DETECTED NOT DETECTED Final   Staphylococcus species DETECTED (A) NOT DETECTED Final    Comment: CRITICAL RESULT CALLED TO, READ BACK BY AND VERIFIED WITH: A. HARTLEY RN 11/08/16 1835 BEAMJ    Staphylococcus aureus DETECTED (A) NOT DETECTED Final    Comment: Methicillin (oxacillin) susceptible Staphylococcus aureus (MSSA). Preferred therapy is anti staphylococcal beta lactam antibiotic (Cefazolin or Nafcillin), unless clinically contraindicated. CRITICAL RESULT CALLED TO, READ BACK BY AND VERIFIED WITH: A. HARTLEY RN 11/08/16 1835 BEAMJ    Methicillin resistance NOT DETECTED NOT DETECTED Final   Streptococcus species NOT DETECTED NOT DETECTED Final   Streptococcus agalactiae NOT DETECTED NOT DETECTED Final   Streptococcus pneumoniae NOT DETECTED NOT DETECTED Final   Streptococcus pyogenes NOT DETECTED NOT DETECTED Final   Acinetobacter baumannii NOT DETECTED NOT DETECTED Final   Enterobacteriaceae species NOT DETECTED NOT DETECTED Final   Enterobacter cloacae complex NOT DETECTED NOT DETECTED Final   Escherichia coli NOT DETECTED NOT DETECTED Final   Klebsiella oxytoca NOT DETECTED NOT DETECTED Final   Klebsiella  pneumoniae NOT DETECTED NOT DETECTED Final   Proteus species NOT DETECTED NOT DETECTED Final   Serratia marcescens NOT DETECTED NOT DETECTED Final   Haemophilus influenzae NOT DETECTED NOT DETECTED Final   Neisseria meningitidis NOT DETECTED NOT DETECTED Final   Pseudomonas aeruginosa NOT DETECTED NOT DETECTED Final   Candida albicans NOT DETECTED NOT DETECTED Final   Candida glabrata NOT DETECTED NOT DETECTED Final   Candida krusei NOT DETECTED NOT DETECTED Final   Candida parapsilosis NOT DETECTED NOT DETECTED Final   Candida tropicalis NOT DETECTED NOT DETECTED Final    Comment: Performed at [facility], [address] [place], [place] [address]  Culture, blood (Routine X 2) w Reflex to ID Panel     Status: Abnormal   Collection Time: 11/09/16  2:58 AM  Result Value Ref Range Status   Specimen Description BLOOD LEFT ANTECUBITAL  Final   Special Requests   Final    BOTTLES DRAWN AEROBIC ONLY Blood Culture adequate volume   Culture  Setup Time   Final    GRAM POSITIVE COCCI IN CLUSTERS AEROBIC BOTTLE ONLY CRITICAL RESULT CALLED TO, READ BACK BY AND VERIFIED WITH: PHARMD M BELL [id number] MLM    Culture (A)  Final    STAPHYLOCOCCUS AUREUS SUSCEPTIBILITIES PERFORMED ON PREVIOUS CULTURE WITHIN THE LAST 5 DAYS. Performed at [facility], [address], [place], [place] [address]    Report Status 11/10/2016 FINAL  Final  Body fluid culture     Status: None   Collection Time: 11/09/16  4:12 AM  Result Value Ref Range Status   Specimen Description SYNOVIAL  Final   Special Requests NONE  Final   Gram Stain   Final    ABUNDANT WBC PRESENT,BOTH PMN AND MONONUCLEAR NO ORGANISMS SEEN    Culture   Final    NO GROWTH 3 DAYS INTERPRET RESULTS WITH CAUTION DUE TO LIMITED SPECIMEN VOLUME Performed at [facility], [address] [place], [place] [address]    Report Status 11/12/2016 FINAL  Final  Culture, blood (Routine X 2) w Reflex to ID Panel     Status: Abnormal    Collection Time: 11/09/16  7:44 AM  Result Value Ref Range Status   Specimen Description BLOOD RIGHT ARM  Final   Special Requests IN PEDIATRIC BOTTLE Blood Culture adequate volume  Final   Culture  Setup Time   Final  GRAM POSITIVE COCCI IN CLUSTERS IN PEDIATRIC BOTTLE CRITICAL RESULT CALLED TO, READ BACK BY AND VERIFIED WITH: G.ABBOTT, PHARMD 11/10/16 0017 L.CHAMPION    Culture (A)  Final    STAPHYLOCOCCUS AUREUS SUSCEPTIBILITIES PERFORMED ON PREVIOUS CULTURE WITHIN THE LAST 5 DAYS. Performed at [facility], [address] [place], [place] [address]    Report Status 11/11/2016 FINAL  Final  Urine Culture     Status: Abnormal   Collection Time: 11/10/16 10:52 AM  Result Value Ref Range Status   Specimen Description URINE, RANDOM  Final   Special Requests NONE  Final   Culture <10,000 COLONIES/mL INSIGNIFICANT GROWTH (A)  Final   Report Status 11/11/2016 FINAL  Final  Culture, blood (Routine X 2) w Reflex to ID Panel     Status: Abnormal   Collection Time: 11/11/16  6:44 PM  Result Value Ref Range Status   Specimen Description BLOOD LEFT HAND  Final   Special Requests   Final    BOTTLES DRAWN AEROBIC AND ANAEROBIC Blood Culture adequate volume   Culture  Setup Time   Final    GRAM POSITIVE COCCI IN CLUSTERS ANAEROBIC BOTTLE ONLY CRITICAL VALUE NOTED.  VALUE IS CONSISTENT WITH PREVIOUSLY REPORTED AND CALLED VALUE.    Culture (A)  Final    STAPHYLOCOCCUS AUREUS SUSCEPTIBILITIES PERFORMED ON PREVIOUS CULTURE WITHIN THE LAST 5 DAYS.    Report Status 11/14/2016 FINAL  Final  Culture, blood (Routine X 2) w Reflex to ID Panel     Status: Abnormal   Collection Time: 11/11/16  6:44 PM  Result Value Ref Range Status   Specimen Description BLOOD LEFT ARM  Final   Special Requests IN PEDIATRIC BOTTLE Blood Culture adequate volume  Final   Culture  Setup Time   Final    GRAM POSITIVE COCCI IN CLUSTERS IN PEDIATRIC BOTTLE Organism ID to follow CRITICAL RESULT CALLED TO,  READ BACK BY AND VERIFIED WITH: H BAIRD 11/12/16 @ 1326 M VESTAL    Culture STAPHYLOCOCCUS AUREUS (A)  Final   Report Status 11/14/2016 FINAL  Final   Organism ID, Bacteria STAPHYLOCOCCUS AUREUS  Final      Susceptibility   Staphylococcus aureus - MIC*    CIPROFLOXACIN <=0.5 SENSITIVE Sensitive     ERYTHROMYCIN >=8 RESISTANT Resistant     GENTAMICIN <=0.5 SENSITIVE Sensitive     OXACILLIN 0.5 SENSITIVE Sensitive     TETRACYCLINE <=1 SENSITIVE Sensitive     VANCOMYCIN <=0.5 SENSITIVE Sensitive     TRIMETH/SULFA <=10 SENSITIVE Sensitive     CLINDAMYCIN RESISTANT Resistant     RIFAMPIN <=0.5 SENSITIVE Sensitive     Inducible Clindamycin POSITIVE Resistant     * STAPHYLOCOCCUS AUREUS  Blood Culture ID Panel (Reflexed)     Status: Abnormal   Collection Time: 11/11/16  6:44 PM  Result Value Ref Range Status   Enterococcus species NOT DETECTED NOT DETECTED Final   Vancomycin resistance NOT DETECTED NOT DETECTED Final   Listeria monocytogenes NOT DETECTED NOT DETECTED Final   Staphylococcus species DETECTED (A) NOT DETECTED Final    Comment: CRITICAL RESULT CALLED TO, READ BACK BY AND VERIFIED WITH: H BAIRD 11/12/16 @ 1326 M VESTAL    Staphylococcus aureus DETECTED (A) NOT DETECTED Final    Comment: CRITICAL RESULT CALLED TO, READ BACK BY AND VERIFIED WITH: H BAIRD 11/12/16 @ 1326 M VESTAL    Methicillin resistance NOT DETECTED NOT DETECTED Final   Streptococcus species NOT DETECTED NOT DETECTED Final   Streptococcus agalactiae  NOT DETECTED NOT DETECTED Final   Streptococcus pneumoniae NOT DETECTED NOT DETECTED Final   Streptococcus pyogenes NOT DETECTED NOT DETECTED Final   Acinetobacter baumannii NOT DETECTED NOT DETECTED Final   Enterobacteriaceae species NOT DETECTED NOT DETECTED Final   Enterobacter cloacae complex NOT DETECTED NOT DETECTED Final   Escherichia coli NOT DETECTED NOT DETECTED Final   Klebsiella oxytoca NOT DETECTED NOT DETECTED Final   Klebsiella pneumoniae NOT  DETECTED NOT DETECTED Final   Proteus species NOT DETECTED NOT DETECTED Final   Serratia marcescens NOT DETECTED NOT DETECTED Final   Carbapenem resistance NOT DETECTED NOT DETECTED Final   Haemophilus influenzae NOT DETECTED NOT DETECTED Final   Neisseria meningitidis NOT DETECTED NOT DETECTED Final   Pseudomonas aeruginosa NOT DETECTED NOT DETECTED Final   Candida albicans NOT DETECTED NOT DETECTED Final   Candida glabrata NOT DETECTED NOT DETECTED Final   Candida krusei NOT DETECTED NOT DETECTED Final   Candida parapsilosis NOT DETECTED NOT DETECTED Final   Candida tropicalis NOT DETECTED NOT DETECTED Final  Culture, blood (Routine X 2) w Reflex to ID Panel     Status: None (Preliminary result)   Collection Time: 11/12/16  4:30 PM  Result Value Ref Range Status   Specimen Description BLOOD LEFT ARM  Final   Special Requests   Final    BOTTLES DRAWN AEROBIC AND ANAEROBIC Blood Culture results may not be optimal due to an excessive volume of blood received in culture bottles   Culture NO GROWTH 3 DAYS  Final   Report Status PENDING  Incomplete  Culture, blood (Routine X 2) w Reflex to ID Panel     Status: None (Preliminary result)   Collection Time: 11/12/16  4:42 PM  Result Value Ref Range Status   Specimen Description BLOOD LEFT WRIST  Final   Special Requests   Final    BOTTLES DRAWN AEROBIC AND ANAEROBIC Blood Culture results may not be optimal due to an excessive volume of blood received in culture bottles   Culture NO GROWTH 3 DAYS  Final   Report Status PENDING  Incomplete    Cody CourierVahini Carmine Youngberg, MD [facility] [phone] pager   [phone] cell 11/16/2016, 9:08 AM

## 2016-11-17 DIAGNOSIS — M868X7 Other osteomyelitis, ankle and foot: Secondary | ICD-10-CM

## 2016-11-17 DIAGNOSIS — M6588 Other synovitis and tenosynovitis, other site: Secondary | ICD-10-CM

## 2016-11-17 DIAGNOSIS — I079 Rheumatic tricuspid valve disease, unspecified: Secondary | ICD-10-CM

## 2016-11-17 DIAGNOSIS — B9561 Methicillin susceptible Staphylococcus aureus infection as the cause of diseases classified elsewhere: Secondary | ICD-10-CM

## 2016-11-17 DIAGNOSIS — M0800 Unspecified juvenile rheumatoid arthritis of unspecified site: Secondary | ICD-10-CM

## 2016-11-17 DIAGNOSIS — D649 Anemia, unspecified: Secondary | ICD-10-CM

## 2016-11-17 DIAGNOSIS — M25562 Pain in left knee: Secondary | ICD-10-CM

## 2016-11-17 LAB — CULTURE, BLOOD (ROUTINE X 2)
CULTURE: NO GROWTH
Culture: NO GROWTH

## 2016-11-17 LAB — HCV RNA QUANT RFLX ULTRA OR GENOTYP
HCV RNA Qnt(log copy/mL): 5.669 log10 IU/mL
HepC Qn: 467000 IU/mL

## 2016-11-17 LAB — HEPATITIS C GENOTYPE

## 2016-11-17 LAB — CREATININE, SERUM
Creatinine, Ser: 0.9 mg/dL (ref 0.61–1.24)
GFR calc Af Amer: >60 mL/min (ref >60)
GFR calc non Af Amer: >60 mL/min (ref >60)

## 2016-11-17 MED ORDER — DIPHENHYDRAMINE HCL 25 MG PO CAPS: 25.0000 mg | ORAL_CAPSULE | Freq: Four times a day (QID) | ORAL | 0 refills | Status: DC | PRN

## 2016-11-17 MED ORDER — LINEZOLID 600 MG PO TABS: 600.0000 mg | ORAL_TABLET | Freq: Two times a day (BID) | ORAL | 0 refills | Status: AC

## 2016-11-17 MED ORDER — PANTOPRAZOLE SODIUM 40 MG PO TBEC: 40.0000 mg | DELAYED_RELEASE_TABLET | Freq: Every day | ORAL | 0 refills | Status: DC

## 2016-11-17 MED FILL — LINEZOLID 600 MG TAB: 600 | 29 days supply | Qty: 58 | Fill #0

## 2016-11-17 NOTE — Discharge Summary (Signed)
Physician Discharge Summary  Cody Rios BJY:[id number] DOB: 1990-12-18 DOA: 11/10/2016  PCP: System, [name]  Admit date: 11/10/2016 Discharge date: 11/17/2016  Time spent: 32 minutes  Recommendations for Outpatient Follow-up:  1. continue Zyvox until 9/14--THis was vetted by Dr. Orvan Falconer of ID and patient will need[facility]-they will call him with an appointment 2. Needs further suboxone as OP per Dr. Oswaldo Done  Will cc him on this note for Care coordination/care transition 3. patient aware of cessation resources 4.  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 Vancomycin versus other-improving on discharge  Discharge Diagnoses:  Principal Problem:   Endocarditis of tricuspid valve Active Problems:   Severe tricuspid regurgitation   Pulmonary HTN (HCC)   Multifocal pneumonia   ?? Septic pulmonary embolism (HCC)   Heroin abuse   Tenosynovitis of right wrist   Effusion of right knee   MSSA bacteremia   History of juvenile rheumatoid arthritis   Anemia   Abnormal urinalysis   Endocarditis   Acute hematogenous osteomyelitis of right foot (HCC)   Osteomyelitis of toe of right foot [facility])   Discharge Condition: inpatient  Diet recommendation: reg  Filed Weights   11/15/16 0421 11/16/16 0602 11/17/16 0500  Weight: 73.5 kg (162 lb 0.6 oz) 72.5 kg (159 lb 14.4 oz) 72.7 kg (160 lb 4.8 oz)    History of present illness:  26 y.o.male  IVDA w/ known MSSA bacteremia,  endocarditis w/ multifocal pneumonia   septic pulmonary emboli.   [facility] the day prior AMA.  Patient was called at home for MSSA  bacteremia and he came back to hospital.  Patient was started on IV vancomycin, as he has allergy to cephalosporin, penicillin. He underwent TEE which confirmed multiple vegetations on tricuspid valve with moderate to severe tricuspid regurgitation. Patient has been started on Suboxone by internal  medicine clinic.  Hospital Course:   Tricuspid valve endocarditis/septic pulmonary emboli- CT chest shows bilateral multifocal pneumonia likely septic emboli from right side endocarditis. TTE shows vegetation on tricuspid valve with significant tricuspid regurgitation and high PA pressure.  Blood cultures x 2  positive for MSSA.  Patient is currently on vancomycin. ID and Cardiology  are following. TEE Confirmed multiple vegetations on tricuspid valve with moderate to severe regurgitation.  CT surgery Consult appreciated and agree that no specific workup or surgery is indicated in his case MSSA bacteremia- BXC x 2 on 11/09/2016 showed MSSA bacteremia in 2 out of 2 bottles.  ID changed vancomycin as Zyvox  --end date 9/14 Right ankle osteomyelitis-   MRI of the right ankle shows osteomyelitis, continue antibiotics as above. Orthopedic surgery was consulted, and recommended to continue with antibiotics.  No surgical intervention at this time. 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 Continue Toradol 15 mg IV every 6 hours Will change to ibuprofen on discharge 400 3 times a day  Heroine abuse-  patient has a history of IV drug abuse, and is active heroin abuser.  He has been started on Suboxone by internal medicine clinic attending [name].  Will continue with Suboxone,Bentyl, Catapres tapering dose to help with withdrawal symptoms.  Tenosynovitis of right wrist- tenosynovitis likely infectious confirmed on MRI 11/09/2016. Continue antibiotics as above.  History of juvenile rheumatoid arthritis- patient has been taking sulfasalazine for 2-3 years but  stopped because he didn't think it was helping.  Anemia- hemoglobin is stable at 9.2, likely due to malnutrition in setting of IV drug abuse. Iron level 42,  saturation 16%, ferritin 276, B12 1379, folate 18.2.  Procedures: TEE - Left ventricle: Systolic function was vigorous. The estimated   ejection fraction was in the range of 65% to 70%. - Aortic valve: No evidence of vegetation. - Mitral valve: No evidence of vegetation. - Left atrium: No evidence of thrombus in the atrial cavity or   appendage. - Tricuspid valve: There was a vegetation. There was   moderate-severe regurgitation.  Consultations:  ID  CVTS  Cardiology  Discharge Exam: Vitals:   11/17/16 0553 11/17/16 1334  BP:  112/80  Pulse: 91 90  Resp: 15 16  Temp: 97.7 F (36.5 C) 98 F (36.7 C)  SpO2: 95% 97%    General: alert poor eye contact, no ict no pallor Cardiovascular: s1 s 2no m/r/g Respiratory: clear no added sound Abd soft nt nd Neuro intact Rash has vasculitic features --lower extremities shows purpuric features  Discharge Instructions   Discharge Instructions    Diet - low sodium heart healthy    Complete by:  As directed    Discharge instructions    Complete by:  As directed    Please follow with ID as an OP Your skin rash could be related to the medication you were on or could be a rash from your endocarditis I would recommend you follow with Dr. Sabino Niemann for your Suboxone scripts I would also recommend that [facility]--they will call you for an appt. Best of luck   Increase activity slowly    Complete by:  As directed      Current Discharge Medication List    START taking these medications   Details  diphenhydrAMINE (BENADRYL) 25 mg capsule Take 1 capsule (25 mg total) by mouth every 6 (six) hours as needed for itching or allergies. Qty: 15 capsule, Refills: 0    linezolid (ZYVOX) 600 MG tablet Take 1 tablet (600 mg total) by mouth every 12 (twelve) hours. Qty: 58 tablet, Refills: 0    pantoprazole (PROTONIX) 40 MG tablet Take 1 tablet (40 mg total) by mouth daily. Qty: 30 tablet,  Refills: 0      CONTINUE these medications which have NOT CHANGED   Details  ibuprofen (ADVIL,MOTRIN) 800 MG tablet Take 1 tablet (800 mg total) by mouth every 8 (eight) hours as needed. Qty: 21 tablet, Refills: 0      STOP taking these medications     azithromycin (ZITHROMAX) 250 MG tablet      naproxen (NAPROSYN) 500 MG tablet        Allergies  Allergen Reactions  . Cephalosporins Hives  . Penicillins Hives, Shortness Of Breath and Rash   Follow-up Information    [facility] Follow up.   Why:  Please Pick up Rx for linezolid (ZYVOX) tablet 600 mg @ this location.  Contact information: Located Behind: [facility] Address: [address] [place], [place], [place] [address] Hours: Open  Closes 6PM Phone: [phone]           The results of significant diagnostics from this hospitalization (including imaging, microbiology, ancillary and laboratory) are listed below for reference.    Significant Diagnostic Studies: Dg Chest 2 View  Result Date: 11/16/2016 CLINICAL DATA:  Endocarditis. Persistent chest pain shortness of breath. EXAM: CHEST  2  VIEW COMPARISON:  CT 11/10/2016.  Plain film of 11/09/2016. FINDINGS: Midline trachea. Normal heart size. Trace left pleural fluid. No pneumothorax. Patchy bilateral pulmonary opacities. Cavitary lesion in the right upper lobe is grossly similar to the prior CT, given cross modality comparison. There is also a a lingular lesion with cavitation which is grossly similar. Minimal cavitation within the superior segment right lower lobe lesion. Patchy bibasilar airspace disease also seen. Left apical area of consolidation demonstrates probable new cavitation. IMPRESSION: Given cross modality comparison, relatively similar appearance of multifocal pneumonia and septic emboli. This is progressive compared 11/09/2016 radiograph. Small left pleural effusion. Electronically Signed   By: Jeronimo Greaves M.D.   On:  11/16/2016 12:20   Dg Chest 2 View  Result Date: 11/09/2016 CLINICAL DATA:  Acute onset of generalized chest pain and shortness of breath. Bacteremia. Initial encounter. EXAM: CHEST  2 VIEW COMPARISON:  CT of the chest performed 11/04/2016 FINDINGS: New bilateral midlung airspace opacities raise concern for multifocal pneumonia. No pleural effusion or pneumothorax is seen. The heart is normal in size. No acute osseous abnormalities are seen. IMPRESSION: New bilateral midlung airspace opacities raise concern for multifocal pneumonia. Electronically Signed   By: [name] M.D.   On: 11/09/2016 04:58   Dg Chest 2 View  Result Date: 11/04/2016 CLINICAL DATA:  Chest pain after IV drug use at 10 p.m. EXAM: CHEST  2 VIEW COMPARISON:  10/16/2016 FINDINGS: Shallow inspiration. The heart size and mediastinal contours are within normal limits. Both lungs are clear. The visualized skeletal structures are unremarkable. IMPRESSION: No active cardiopulmonary disease. Electronically Signed   By: Burman Nieves M.D.   On: 11/04/2016 04:50   Dg Wrist 2 Views Right  Result Date: 11/09/2016 CLINICAL DATA:  Pain and swelling for 3 days. EXAM: RIGHT WRIST - 2 VIEW COMPARISON:  RIGHT hand radiograph July 22, 2010 FINDINGS: There is no evidence of fracture or dislocation. There is no evidence of arthropathy or other focal bone abnormality. Soft tissues are unremarkable. IMPRESSION: Negative. Electronically Signed   By: [name] M.D.   On: 11/09/2016 04:17   Dg Wrist Complete Left  Result Date: 11/09/2016 CLINICAL DATA:  Pain and swelling for 3 days. EXAM: LEFT WRIST - COMPLETE 3+ VIEW COMPARISON:  None. FINDINGS: There is no evidence of fracture or dislocation. There is no evidence of arthropathy or other focal bone abnormality. Soft tissues are unremarkable. IMPRESSION: Negative. Electronically Signed   By: [name] M.D.   On: 11/09/2016 04:18   Ct Angio Chest Pe W Or Wo Contrast  Result Date:  11/10/2016 CLINICAL DATA:  Septic arterial embolism. Tricuspid valve endocarditis. EXAM: CT ANGIOGRAPHY CHEST WITH CONTRAST TECHNIQUE: Multidetector CT imaging of the chest was performed using the standard protocol during bolus administration of intravenous contrast. Multiplanar CT image reconstructions and MIPs were obtained to evaluate the vascular anatomy. CONTRAST:  100 cc Isovue 370 intravenous COMPARISON:  11/04/2016 FINDINGS: Cardiovascular: Normal heart size. There is a small anterior pericardial effusion without visible serosal nodularity or thickening. The tricuspid valve is not characterized due to motion. Suboptimal contrast bolus. Injection rate was decreased due to IV access. There are at least 3 areas of subsegmental pulmonary artery thrombosis, including right upper lobe 7:152, right lower lobe 7:194, and lingula 7:167. Mediastinum/Nodes: Mild enlargement of mediastinal lymph nodes considered reactive in this setting. Lungs/Pleura: Numerous ground-glass and consolidative nodules randomly distributed in the bilateral lungs, some with cavitation, consistent with septic emboli history. There is no pleural effusion  or pneumothorax. No pulmonary edema. Upper Abdomen: No acute finding. Musculoskeletal: No acute finding.  No signs of osseous infection. Critical Value/emergent results were called by telephone at the time of interpretation on 11/10/2016 at 9:07 am to Dr. Konrad DoloresMerrell , who verbally acknowledged these results. Review of the MIP images confirms the above findings. IMPRESSION: 1. Bilateral subsegmental pulmonary emboli associated with cavitating pneumonias and consistent with history of septic emboli. No effusion or air leak. 2. Small anterior pericardial effusion. Electronically Signed   By: Marnee SpringJonathon  Watts M.D.   On: 11/10/2016 09:07   Ct Angio Chest Pe W And/or Wo Contrast  Result Date: 11/04/2016 CLINICAL DATA:  Sub sternal chest pain, worse with inspiration, shortness of breath, positive D-dimer  EXAM: CT ANGIOGRAPHY CHEST WITH CONTRAST TECHNIQUE: Multidetector CT imaging of the chest was performed using the standard protocol during bolus administration of intravenous contrast. Multiplanar CT image reconstructions and MIPs were obtained to evaluate the vascular anatomy. CONTRAST:  60 mL Isovue 370 IV COMPARISON:  None. FINDINGS: Cardiovascular: Heart size normal. Satisfactory opacification of pulmonary arteries noted, and there is no evidence of pulmonary emboli. Adequate contrast opacification of the thoracic aorta with no evidence of dissection, aneurysm, or stenosis. There is bovine variant brachiocephalic arch anatomy without proximal stenosis. No significant atheromatous irregularity. Mediastinum/Nodes: No enlarged mediastinal, hilar, or axillary lymph nodes. Thyroid gland, trachea, and esophagus demonstrate no significant findings. Lungs/Pleura: 7mm ground-glass nodule, superior segment right lower lobe. Lungs otherwise clear. No pleural effusion. No pneumothorax. Upper Abdomen: No acute abnormality. Musculoskeletal: Mild thoracic scoliosis without vertebral anomaly. Negative for fracture or worrisome bone lesion. Review of the MIP images confirms the above findings. IMPRESSION: 1. Negative for acute PE or thoracic aortic dissection. 2. Nonspecific 7 mm right lower lobe ground-glass nodule, statistically most likely inflammatory/infectious. Initial follow-up with CT at 6-12 months is recommended to confirm persistence. If persistent, repeat CT is recommended every 2 years until 5 years of stability has been established. This recommendation follows the consensus statement: Guidelines for Management of Incidental Pulmonary Nodules Detected on CT Images: From the Fleischner Society 2017; Radiology 2017; 284:228-243. Electronically Signed   By: Corlis Leak  Hassell M.D.   On: 11/04/2016 08:06   Mr Wrist Right Wo Contrast  Result Date: 11/09/2016 CLINICAL DATA:  History of IV drug abuse. Right wrist pain, concern  for infection. EXAM: MR OF THE RIGHT WRIST WITHOUT CONTRAST TECHNIQUE: Multiplanar, multisequence MR imaging of the right wrist was performed. No intravenous contrast was administered. COMPARISON:  Right wrist x-rays dated November 09, 2016. FINDINGS: Limited and incomplete examination due to patient's inability to tolerate further sequences secondary to pain. Ligaments: Intact scapholunate and lunotriquetral ligaments. Triangular fibrocartilage: Intact TFCC. Tendons: There is trace fluid in the adductor pollicis longus tendon sheath at the level of the distal scaphoid. There is fluid within the flexor carpi the radialis longus and brevis tendon sheaths at the level of Lister's tubercle. Mild thickening of the extensor carpi ulnaris tendon with a small amount of fluid in its tendon sheath. The flexor tendons are intact. Carpal tunnel/median nerve: Normal carpal tunnel. Normal median nerve. Guyon's canal: Normal. Joint/cartilage: Small radiocarpal joint effusion. No definite chondral defect. Bones/carpal alignment: No marrow signal abnormality. Normal alignment. No aggressive osseous lesion. Other: No fluid collection or hematoma. IMPRESSION: 1. Limited and incomplete examination due to patient's inability to tolerate further sequences secondary to pain. 2. Mild tenosynovitis of the abductor pollicis longus, flexor carpi the radialis, and extensor carpi ulnaris. Infectious tenosynovitis cannot be excluded  on the basis of imaging alone. 3. Small radiocarpal joint effusion, nonspecific. If there is clinical concern for septic arthritis, joint aspiration is recommended. Electronically Signed   By: Obie Dredge M.D.   On: 11/09/2016 11:45   Mr Foot Right Wo Contrast  Result Date: 11/10/2016 CLINICAL DATA:  Methicillin sensitive Staph aureus infection in blood cultures from August 7th. Patient presents with right foot pain and anterior right foot swelling. History of IV drug abuse with opiate addiction. EXAM: MRI OF  THE RIGHT FOREFOOT WITHOUT CONTRAST TECHNIQUE: Multiplanar, multisequence MR imaging of the right foot was performed. No intravenous contrast was administered. COMPARISON:  None. FINDINGS: Bones/Joint/Cartilage Marrow signal abnormality involving the proximal half of the right third metatarsal shaft from proximal metaphysis through mid diaphysis noted with subtle cortical irregularity along the plantar and lateral aspect of the metatarsal concerning for changes of acute osteomyelitis. Ligaments Negative Muscles and Tendons No evidence of intramuscular abscess or myositis. Extensor and flexor tendons crossing the foot are of normal signal intensity morphology. Soft tissues Generalized soft tissue edema overlying the dorsum of the mid and forefoot without focal abscess. IMPRESSION: Marrow signal abnormalities involving the right third metatarsal shaft from proximal metaphysis to mid diaphysis. This in conjunction with mild cortical irregularity along the lateral aspect raise concern for changes of osteomyelitis. Electronically Signed   By: Tollie Eth M.D.   On: 11/10/2016 23:21   Mr Ankle Right Wo Contrast  Result Date: 11/10/2016 CLINICAL DATA:  Is methicillin sensitive Staph aureus in blood cultures. Midfoot pain. EXAM: MRI OF THE RIGHT ANKLE WITHOUT CONTRAST TECHNIQUE: Multiplanar, multisequence MR imaging of the ankle was performed. No intravenous contrast was administered. COMPARISON:  None. FINDINGS: Patient refused IV contrast limiting assessment for osteomyelitis. TENDONS Peroneal: Intact peroneus longus and peroneus brevis tendons. Posteromedial: Intact tibialis posterior, flexor hallucis longus and flexor digitorum longus tendons. Anterior: Intact tibialis anterior, extensor hallucis longus and extensor digitorum longus tendons. Achilles: Intact. Plantar Fascia: Intact. LIGAMENTS Lateral: Intact. Medial: Intact. CARTILAGE Ankle Joint: Small ankle joint effusion.  No chondral defects. Subtalar  Joints/Sinus Tarsi: Trace fluid in the subtalar joint also noted. Bones: Marrow signal abnormality involving the third metatarsal shaft from proximal metaphysis through the mid diaphysis. This in conjunction with history of positive blood cultures for staph and overlying soft tissue swelling, findings raise concern for osteomyelitis. Other: None IMPRESSION: Findings suspicious for osteomyelitis of the proximal third metatarsal shaft. Trace tibiotalar and subtalar joint fluid.  No abscess identified. Electronically Signed   By: Tollie Eth M.D.   On: 11/10/2016 23:25   Dg Knee Complete 4 Views Right  Result Date: 11/09/2016 CLINICAL DATA:  Knee swelling for 3 days, no injury. EXAM: RIGHT KNEE - COMPLETE 4+ VIEW COMPARISON:  None. FINDINGS: No evidence of fracture, dislocation, or joint effusion. No evidence of arthropathy or other focal bone abnormality. Mild prepatellar soft tissue swelling. Small suprapatellar joint effusion. IMPRESSION: Soft-tissue swelling and small suprapatellar joint effusion. No acute osseous process or advanced degenerative change for age. Electronically Signed   By: [name] M.D.   On: 11/09/2016 04:15    Microbiology: Recent Results (from the past 240 hour(s))  Culture, blood (routine x 2)     Status: Abnormal   Collection Time: 11/08/16 12:15 AM  Result Value Ref Range Status   Specimen Description BLOOD RIGHT ARM  Final   Special Requests   Final    BOTTLES DRAWN AEROBIC AND ANAEROBIC Blood Culture adequate volume   Culture  Setup Time  Final    GRAM POSITIVE COCCI IN CLUSTERS IN BOTH AEROBIC AND ANAEROBIC BOTTLES CRITICAL RESULT CALLED TO, READ BACK BY AND VERIFIED WITH: A. HARTLEY RN 11/18/16 1835 BEAMJ    Culture (A)  Final    STAPHYLOCOCCUS AUREUS SUSCEPTIBILITIES PERFORMED ON PREVIOUS CULTURE WITHIN THE LAST 5 DAYS. Performed at [facility], [address], [place], [place] [address]    Report Status 11/10/2016 FINAL  Final  Culture, blood  (routine x 2)     Status: Abnormal   Collection Time: 11/08/16 12:18 AM  Result Value Ref Range Status   Specimen Description BLOOD BLOOD RIGHT HAND  Final   Special Requests IN PEDIATRIC BOTTLE Blood Culture adequate volume  Final   Culture  Setup Time   Final    GRAM POSITIVE COCCI IN CLUSTERS IN PEDIATRIC BOTTLE CRITICAL VALUE NOTED.  VALUE IS CONSISTENT WITH PREVIOUSLY REPORTED AND CALLED VALUE. Performed at [facility], [address] [place], [place] [address]    Culture STAPHYLOCOCCUS AUREUS (A)  Final   Report Status 11/10/2016 FINAL  Final   Organism ID, Bacteria STAPHYLOCOCCUS AUREUS  Final      Susceptibility   Staphylococcus aureus - MIC*    CIPROFLOXACIN <=0.5 SENSITIVE Sensitive     ERYTHROMYCIN >=8 RESISTANT Resistant     GENTAMICIN <=0.5 SENSITIVE Sensitive     OXACILLIN 0.5 SENSITIVE Sensitive     TETRACYCLINE <=1 SENSITIVE Sensitive     VANCOMYCIN <=0.5 SENSITIVE Sensitive     TRIMETH/SULFA <=10 SENSITIVE Sensitive     CLINDAMYCIN RESISTANT Resistant     RIFAMPIN <=0.5 SENSITIVE Sensitive     Inducible Clindamycin POSITIVE Resistant     * STAPHYLOCOCCUS AUREUS  Blood Culture ID Panel (Reflexed)     Status: Abnormal   Collection Time: 11/08/16 12:18 AM  Result Value Ref Range Status   Enterococcus species NOT DETECTED NOT DETECTED Final   Listeria monocytogenes NOT DETECTED NOT DETECTED Final   Staphylococcus species DETECTED (A) NOT DETECTED Final    Comment: CRITICAL RESULT CALLED TO, READ BACK BY AND VERIFIED WITH: A. HARTLEY RN 11/08/16 1835 BEAMJ    Staphylococcus aureus DETECTED (A) NOT DETECTED Final    Comment: Methicillin (oxacillin) susceptible Staphylococcus aureus (MSSA). Preferred therapy is anti staphylococcal beta lactam antibiotic (Cefazolin or Nafcillin), unless clinically contraindicated. CRITICAL RESULT CALLED TO, READ BACK BY AND VERIFIED WITH: A. HARTLEY RN 11/08/16 1835 BEAMJ    Methicillin resistance NOT DETECTED NOT DETECTED  Final   Streptococcus species NOT DETECTED NOT DETECTED Final   Streptococcus agalactiae NOT DETECTED NOT DETECTED Final   Streptococcus pneumoniae NOT DETECTED NOT DETECTED Final   Streptococcus pyogenes NOT DETECTED NOT DETECTED Final   Acinetobacter baumannii NOT DETECTED NOT DETECTED Final   Enterobacteriaceae species NOT DETECTED NOT DETECTED Final   Enterobacter cloacae complex NOT DETECTED NOT DETECTED Final   Escherichia coli NOT DETECTED NOT DETECTED Final   Klebsiella oxytoca NOT DETECTED NOT DETECTED Final   Klebsiella pneumoniae NOT DETECTED NOT DETECTED Final   Proteus species NOT DETECTED NOT DETECTED Final   Serratia marcescens NOT DETECTED NOT DETECTED Final   Haemophilus influenzae NOT DETECTED NOT DETECTED Final   Neisseria meningitidis NOT DETECTED NOT DETECTED Final   Pseudomonas aeruginosa NOT DETECTED NOT DETECTED Final   Candida albicans NOT DETECTED NOT DETECTED Final   Candida glabrata NOT DETECTED NOT DETECTED Final   Candida krusei NOT DETECTED NOT DETECTED Final   Candida parapsilosis NOT DETECTED NOT DETECTED Final  Candida tropicalis NOT DETECTED NOT DETECTED Final    Comment: Performed at [facility], [address] [place], [place] [address]  Culture, blood (Routine X 2) w Reflex to ID Panel     Status: Abnormal   Collection Time: 11/09/16  2:58 AM  Result Value Ref Range Status   Specimen Description BLOOD LEFT ANTECUBITAL  Final   Special Requests   Final    BOTTLES DRAWN AEROBIC ONLY Blood Culture adequate volume   Culture  Setup Time   Final    GRAM POSITIVE COCCI IN CLUSTERS AEROBIC BOTTLE ONLY CRITICAL RESULT CALLED TO, READ BACK BY AND VERIFIED WITH: PHARMD M BELL [id number] MLM    Culture (A)  Final    STAPHYLOCOCCUS AUREUS SUSCEPTIBILITIES PERFORMED ON PREVIOUS CULTURE WITHIN THE LAST 5 DAYS. Performed at [facility], [address], [place], [place] [address]    Report Status 11/10/2016 FINAL  Final  Body fluid culture      Status: None   Collection Time: 11/09/16  4:12 AM  Result Value Ref Range Status   Specimen Description SYNOVIAL  Final   Special Requests NONE  Final   Gram Stain   Final    ABUNDANT WBC PRESENT,BOTH PMN AND MONONUCLEAR NO ORGANISMS SEEN    Culture   Final    NO GROWTH 3 DAYS INTERPRET RESULTS WITH CAUTION DUE TO LIMITED SPECIMEN VOLUME Performed at [facility], [address], [place], [place] [address]    Report Status 11/12/2016 FINAL  Final  Culture, blood (Routine X 2) w Reflex to ID Panel     Status: Abnormal   Collection Time: 11/09/16  7:44 AM  Result Value Ref Range Status   Specimen Description BLOOD RIGHT ARM  Final   Special Requests IN PEDIATRIC BOTTLE Blood Culture adequate volume  Final   Culture  Setup Time   Final    GRAM POSITIVE COCCI IN CLUSTERS IN PEDIATRIC BOTTLE CRITICAL RESULT CALLED TO, READ BACK BY AND VERIFIED WITH: G.ABBOTT, PHARMD 11/10/16 0017 L.CHAMPION    Culture (A)  Final    STAPHYLOCOCCUS AUREUS SUSCEPTIBILITIES PERFORMED ON PREVIOUS CULTURE WITHIN THE LAST 5 DAYS. Performed at [facility], [address], [place], [place] [address]    Report Status 11/11/2016 FINAL  Final  Urine Culture     Status: Abnormal   Collection Time: 11/10/16 10:52 AM  Result Value Ref Range Status   Specimen Description URINE, RANDOM  Final   Special Requests NONE  Final   Culture <10,000 COLONIES/mL INSIGNIFICANT GROWTH (A)  Final   Report Status 11/11/2016 FINAL  Final  Culture, blood (Routine X 2) w Reflex to ID Panel     Status: Abnormal   Collection Time: 11/11/16  6:44 PM  Result Value Ref Range Status   Specimen Description BLOOD LEFT HAND  Final   Special Requests   Final    BOTTLES DRAWN AEROBIC AND ANAEROBIC Blood Culture adequate volume   Culture  Setup Time   Final    GRAM POSITIVE COCCI IN CLUSTERS ANAEROBIC BOTTLE ONLY CRITICAL VALUE NOTED.  VALUE IS CONSISTENT WITH PREVIOUSLY REPORTED AND CALLED VALUE.    Culture (A)  Final     STAPHYLOCOCCUS AUREUS SUSCEPTIBILITIES PERFORMED ON PREVIOUS CULTURE WITHIN THE LAST 5 DAYS.    Report Status 11/14/2016 FINAL  Final  Culture, blood (Routine X 2) w Reflex to ID Panel     Status: Abnormal   Collection Time: 11/11/16  6:44  PM  Result Value Ref Range Status   Specimen Description BLOOD LEFT ARM  Final   Special Requests IN PEDIATRIC BOTTLE Blood Culture adequate volume  Final   Culture  Setup Time   Final    GRAM POSITIVE COCCI IN CLUSTERS IN PEDIATRIC BOTTLE Organism ID to follow CRITICAL RESULT CALLED TO, READ BACK BY AND VERIFIED WITH: H BAIRD 11/12/16 @ 1326 M VESTAL    Culture STAPHYLOCOCCUS AUREUS (A)  Final   Report Status 11/14/2016 FINAL  Final   Organism ID, Bacteria STAPHYLOCOCCUS AUREUS  Final      Susceptibility   Staphylococcus aureus - MIC*    CIPROFLOXACIN <=0.5 SENSITIVE Sensitive     ERYTHROMYCIN >=8 RESISTANT Resistant     GENTAMICIN <=0.5 SENSITIVE Sensitive     OXACILLIN 0.5 SENSITIVE Sensitive     TETRACYCLINE <=1 SENSITIVE Sensitive     VANCOMYCIN <=0.5 SENSITIVE Sensitive     TRIMETH/SULFA <=10 SENSITIVE Sensitive     CLINDAMYCIN RESISTANT Resistant     RIFAMPIN <=0.5 SENSITIVE Sensitive     Inducible Clindamycin POSITIVE Resistant     * STAPHYLOCOCCUS AUREUS  Blood Culture ID Panel (Reflexed)     Status: Abnormal   Collection Time: 11/11/16  6:44 PM  Result Value Ref Range Status   Enterococcus species NOT DETECTED NOT DETECTED Final   Vancomycin resistance NOT DETECTED NOT DETECTED Final   Listeria monocytogenes NOT DETECTED NOT DETECTED Final   Staphylococcus species DETECTED (A) NOT DETECTED Final    Comment: CRITICAL RESULT CALLED TO, READ BACK BY AND VERIFIED WITH: H BAIRD 11/12/16 @ 1326 M VESTAL    Staphylococcus aureus DETECTED (A) NOT DETECTED Final    Comment: CRITICAL RESULT CALLED TO, READ BACK BY AND VERIFIED WITH: H BAIRD 11/12/16 @ 1326 M VESTAL    Methicillin resistance NOT DETECTED NOT DETECTED Final   Streptococcus  species NOT DETECTED NOT DETECTED Final   Streptococcus agalactiae NOT DETECTED NOT DETECTED Final   Streptococcus pneumoniae NOT DETECTED NOT DETECTED Final   Streptococcus pyogenes NOT DETECTED NOT DETECTED Final   Acinetobacter baumannii NOT DETECTED NOT DETECTED Final   Enterobacteriaceae species NOT DETECTED NOT DETECTED Final   Enterobacter cloacae complex NOT DETECTED NOT DETECTED Final   Escherichia coli NOT DETECTED NOT DETECTED Final   Klebsiella oxytoca NOT DETECTED NOT DETECTED Final   Klebsiella pneumoniae NOT DETECTED NOT DETECTED Final   Proteus species NOT DETECTED NOT DETECTED Final   Serratia marcescens NOT DETECTED NOT DETECTED Final   Carbapenem resistance NOT DETECTED NOT DETECTED Final   Haemophilus influenzae NOT DETECTED NOT DETECTED Final   Neisseria meningitidis NOT DETECTED NOT DETECTED Final   Pseudomonas aeruginosa NOT DETECTED NOT DETECTED Final   Candida albicans NOT DETECTED NOT DETECTED Final   Candida glabrata NOT DETECTED NOT DETECTED Final   Candida krusei NOT DETECTED NOT DETECTED Final   Candida parapsilosis NOT DETECTED NOT DETECTED Final   Candida tropicalis NOT DETECTED NOT DETECTED Final  Culture, blood (Routine X 2) w Reflex to ID Panel     Status: None   Collection Time: 11/12/16  4:30 PM  Result Value Ref Range Status   Specimen Description BLOOD LEFT ARM  Final   Special Requests   Final    BOTTLES DRAWN AEROBIC AND ANAEROBIC Blood Culture results may not be optimal due to an excessive volume of blood received in culture bottles   Culture NO GROWTH 5 DAYS  Final   Report Status 11/17/2016 FINAL  Final  Culture,  blood (Routine X 2) w Reflex to ID Panel     Status: None   Collection Time: 11/12/16  4:42 PM  Result Value Ref Range Status   Specimen Description BLOOD LEFT WRIST  Final   Special Requests   Final    BOTTLES DRAWN AEROBIC AND ANAEROBIC Blood Culture results may not be optimal due to an excessive volume of blood received in  culture bottles   Culture NO GROWTH 5 DAYS  Final   Report Status 11/17/2016 FINAL  Final     Labs: Basic Metabolic Panel:  Recent Labs Lab 11/10/16 1804  11/11/16 0250 11/12/16 0545 11/14/16 0637 11/15/16 0217 11/16/16 0704 11/17/16 0528  NA  --   --  134* 136 133* 132* 129*  --   K  --   --  3.4* 4.9 4.2 5.0 4.6  --   CL  --   --  102 102 98* 98* 93*  --   CO2  --   --  24 26 29 24 28   --   GLUCOSE  --   --  137* 91 112* 118* 110*  --   BUN  --   --  8 17 12 14 14   --   CREATININE  --   < > 0.63 0.67 0.65 0.72 0.82 0.90  CALCIUM  --   --  7.8* 8.4* 8.2* 7.8* 8.4*  --   MG 1.9  --   --   --   --   --   --   --   < > = values in this interval not displayed. Liver Function Tests:  Recent Labs Lab 11/11/16 0250 11/12/16 0545  AST 32 35  ALT 45 43  ALKPHOS 217* 199*  BILITOT 1.1 1.4*  PROT 5.3* 6.3*  ALBUMIN 2.1* 2.3*   No results for input(s): LIPASE, AMYLASE in the last 168 hours. No results for input(s): AMMONIA in the last 168 hours. CBC:  Recent Labs Lab 11/12/16 0545 11/13/16 0324 11/14/16 0637 11/15/16 0217 11/16/16 0704  WBC 12.4* 11.8* 12.8* 12.5* 14.0*  HGB 11.0* 10.1* 9.9* 9.2* 9.5*  HCT 31.1* 30.3* 29.2* 27.8* 28.2*  MCV 83.2 83.5 83.2 84.2 82.9  PLT 371 422* 335 311 373   Cardiac Enzymes: No results for input(s): CKTOTAL, CKMB, CKMBINDEX, TROPONINI in the last 168 hours. BNP: BNP (last 3 results)  Recent Labs  11/09/16 0258  BNP 139.6*    ProBNP (last 3 results) No results for input(s): PROBNP in the last 8760 hours.  CBG: No results for input(s): GLUCAP in the last 168 hours.     SignedRhetta Mura MD   [facility] 11/17/2016, 3:18 PM

## 2016-11-17 NOTE — Progress Notes (Signed)
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 Patient set up with outpatient suboxone clinic. CSW met with patient at bedside and offered additional substance use treatment resources. Patient lethargic but oriented and with polite demeanor. CSW discussed [facility] Stop program with patient for clean needle exchange and access to narcan. Patient indicated he is already familiar with substance use treatment resources in the area and is familiar with [facility] Stop. CSW offered to leave resource packet as a backup and patient declined. CSW signing off.  Cody Rios, [place]

## 2016-11-17 NOTE — Progress Notes (Signed)
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 Biofire on 8/8 resulted positive for mssa and blood culture collected 8/7 also showed mssa. Repeat blood cultures x2 drawn 8/11 indicate no growth to date. 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   The patient started to notice a maculopapular rash on 8/14 which appear more erythematous today and vesicular. The rash has remained in bilateral lower extremities and has not spread. 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 Vancomycin was stopped and we anticipate that the rash will continue to resolve.   The patient's antibiotic has been switched to po zyvox. Antibiotic choice was made with consideration to the patient's ivdu, penicillin and cephalosporin allergy, and antimicrobial stewardship. IV daptomycin, zosyn and unasyn are not narrow choices for antibiotic therapy. IV clindamycin has varied susceptibilities and Gentamicin requires a two drug regimen.    PLAN 1. Stop Vancomycin  2. Continue po zyvox for 4 weeks. Zyvox was started 11/15/16.       Monitor for potential thrombocytopenia,optic neuritis, or painful neuropathy. 3. Discharge planned for today 8/16 4. Follow up at outpatient ID clinic in 1 month and will evaluate patient's progress    . buprenorphine-naloxone  1 tablet Sublingual BID  . enoxaparin (LOVENOX) injection  40 mg Subcutaneous Q24H  . linezolid  600 mg Oral Q12H  . pantoprazole  40  mg Oral Daily  . sodium chloride flush  3 mL Intravenous Q12H    SUBJECTIVE: Mr. Courington was seen sitting in his bed this morning. He denied any sob or chest pain. The patient states that he does have fatigue and continues to note a rash on his lower extremities.   Review of Systems: ROS as above  Allergies  Allergen Reactions  . Cephalosporins Hives  . Penicillins Hives, Shortness Of Breath and Rash    OBJECTIVE: Vitals:   11/16/16 1400 11/16/16 2022 11/17/16 0500 11/17/16 0553  BP: 110/73 102/78    Pulse: 88 87  91  Resp: 20 20  15   Temp: (!) 97.3 F (36.3 C) 98.1 F (36.7 C)  97.7 F (36.5 C)  TempSrc: Oral Oral  Axillary  SpO2: 98%   95%  Weight:   160 lb 4.8 oz (72.7 kg)   Height:       Body mass index is 20.58 kg/m.  Physical Exam  Constitutional: He is well-developed, well-nourished, and in no distress. He appears lethargic. No distress.  HENT:  Head: Normocephalic and atraumatic.  Cardiovascular: Normal rate, regular rhythm, normal heart sounds and intact distal pulses.   Pulmonary/Chest: Effort normal and breath sounds normal. No respiratory distress. He has no wheezes.  Abdominal: Soft. Bowel sounds are normal. He exhibits no distension. There is no tenderness.  Neurological: He appears lethargic.  Skin: Rash (on bilateral lower extremity that is erythematous and vesicular in nature) noted.  Psychiatric: Mood, memory, affect and judgment  normal.    Lab Results Lab Results  Component Value Date   WBC 14.0 (H) 11/16/2016   HGB 9.5 (L) 11/16/2016   HCT 28.2 (L) 11/16/2016   MCV 82.9 11/16/2016   PLT 373 11/16/2016    Lab Results  Component Value Date   CREATININE 0.90 11/17/2016   BUN 14 11/16/2016   NA 129 (L) 11/16/2016   K 4.6 11/16/2016   CL 93 (L) 11/16/2016   CO2 28 11/16/2016    Lab Results  Component Value Date   ALT 43 11/12/2016   AST 35 11/12/2016   ALKPHOS 199 (H) 11/12/2016   BILITOT 1.4 (H) 11/12/2016      Microbiology: Recent Results (from the past 240 hour(s))  Culture, blood (routine x 2)     Status: Abnormal   Collection Time: 11/08/16 12:15 AM  Result Value Ref Range Status   Specimen Description BLOOD RIGHT ARM  Final   Special Requests   Final    BOTTLES DRAWN AEROBIC AND ANAEROBIC Blood Culture adequate volume   Culture  Setup Time   Final    GRAM POSITIVE COCCI IN CLUSTERS IN BOTH AEROBIC AND ANAEROBIC BOTTLES CRITICAL RESULT CALLED TO, READ BACK BY AND VERIFIED WITH: A. HARTLEY RN 11/18/16 1835 BEAMJ    Culture (A)  Final    STAPHYLOCOCCUS AUREUS SUSCEPTIBILITIES PERFORMED ON PREVIOUS CULTURE WITHIN THE LAST 5 DAYS. Performed at [facility], [address] [place], [place] [address]    Report Status 11/10/2016 FINAL  Final  Culture, blood (routine x 2)     Status: Abnormal   Collection Time: 11/08/16 12:18 AM  Result Value Ref Range Status   Specimen Description BLOOD BLOOD RIGHT HAND  Final   Special Requests IN PEDIATRIC BOTTLE Blood Culture adequate volume  Final   Culture  Setup Time   Final    GRAM POSITIVE COCCI IN CLUSTERS IN PEDIATRIC BOTTLE CRITICAL VALUE NOTED.  VALUE IS CONSISTENT WITH PREVIOUSLY REPORTED AND CALLED VALUE. Performed at [facility], [address] [place], [place] [address]    Culture STAPHYLOCOCCUS AUREUS (A)  Final   Report Status 11/10/2016 FINAL  Final   Organism ID, Bacteria STAPHYLOCOCCUS AUREUS  Final      Susceptibility   Staphylococcus aureus - MIC*    CIPROFLOXACIN <=0.5 SENSITIVE Sensitive     ERYTHROMYCIN >=8 RESISTANT Resistant     GENTAMICIN <=0.5 SENSITIVE Sensitive     OXACILLIN 0.5 SENSITIVE Sensitive     TETRACYCLINE <=1 SENSITIVE Sensitive     VANCOMYCIN <=0.5 SENSITIVE Sensitive     TRIMETH/SULFA <=10 SENSITIVE Sensitive     CLINDAMYCIN RESISTANT Resistant     RIFAMPIN <=0.5 SENSITIVE Sensitive     Inducible Clindamycin POSITIVE Resistant     * STAPHYLOCOCCUS AUREUS  Blood Culture ID Panel  (Reflexed)     Status: Abnormal   Collection Time: 11/08/16 12:18 AM  Result Value Ref Range Status   Enterococcus species NOT DETECTED NOT DETECTED Final   Listeria monocytogenes NOT DETECTED NOT DETECTED Final   Staphylococcus species DETECTED (A) NOT DETECTED Final    Comment: CRITICAL RESULT CALLED TO, READ BACK BY AND VERIFIED WITH: A. HARTLEY RN 11/08/16 1835 BEAMJ    Staphylococcus aureus DETECTED (A) NOT DETECTED Final    Comment: Methicillin (oxacillin) susceptible Staphylococcus aureus (MSSA). Preferred therapy is anti staphylococcal beta lactam antibiotic (Cefazolin or Nafcillin), unless clinically contraindicated. CRITICAL RESULT CALLED TO, READ BACK BY AND VERIFIED WITH: A. HARTLEY RN 11/08/16 1835  BEAMJ    Methicillin resistance NOT DETECTED NOT DETECTED Final   Streptococcus species NOT DETECTED NOT DETECTED Final   Streptococcus agalactiae NOT DETECTED NOT DETECTED Final   Streptococcus pneumoniae NOT DETECTED NOT DETECTED Final   Streptococcus pyogenes NOT DETECTED NOT DETECTED Final   Acinetobacter baumannii NOT DETECTED NOT DETECTED Final   Enterobacteriaceae species NOT DETECTED NOT DETECTED Final   Enterobacter cloacae complex NOT DETECTED NOT DETECTED Final   Escherichia coli NOT DETECTED NOT DETECTED Final   Klebsiella oxytoca NOT DETECTED NOT DETECTED Final   Klebsiella pneumoniae NOT DETECTED NOT DETECTED Final   Proteus species NOT DETECTED NOT DETECTED Final   Serratia marcescens NOT DETECTED NOT DETECTED Final   Haemophilus influenzae NOT DETECTED NOT DETECTED Final   Neisseria meningitidis NOT DETECTED NOT DETECTED Final   Pseudomonas aeruginosa NOT DETECTED NOT DETECTED Final   Candida albicans NOT DETECTED NOT DETECTED Final   Candida glabrata NOT DETECTED NOT DETECTED Final   Candida krusei NOT DETECTED NOT DETECTED Final   Candida parapsilosis NOT DETECTED NOT DETECTED Final   Candida tropicalis NOT DETECTED NOT DETECTED Final    Comment: Performed  at [facility], [address] [place], [place] [address]  Culture, blood (Routine X 2) w Reflex to ID Panel     Status: Abnormal   Collection Time: 11/09/16  2:58 AM  Result Value Ref Range Status   Specimen Description BLOOD LEFT ANTECUBITAL  Final   Special Requests   Final    BOTTLES DRAWN AEROBIC ONLY Blood Culture adequate volume   Culture  Setup Time   Final    GRAM POSITIVE COCCI IN CLUSTERS AEROBIC BOTTLE ONLY CRITICAL RESULT CALLED TO, READ BACK BY AND VERIFIED WITH: PHARMD M BELL [id number] MLM    Culture (A)  Final    STAPHYLOCOCCUS AUREUS SUSCEPTIBILITIES PERFORMED ON PREVIOUS CULTURE WITHIN THE LAST 5 DAYS. Performed at [facility], [address], [place], [place] [address]    Report Status 11/10/2016 FINAL  Final  Body fluid culture     Status: None   Collection Time: 11/09/16  4:12 AM  Result Value Ref Range Status   Specimen Description SYNOVIAL  Final   Special Requests NONE  Final   Gram Stain   Final    ABUNDANT WBC PRESENT,BOTH PMN AND MONONUCLEAR NO ORGANISMS SEEN    Culture   Final    NO GROWTH 3 DAYS INTERPRET RESULTS WITH CAUTION DUE TO LIMITED SPECIMEN VOLUME Performed at [facility], [address] [place], [place] [address]    Report Status 11/12/2016 FINAL  Final  Culture, blood (Routine X 2) w Reflex to ID Panel     Status: Abnormal   Collection Time: 11/09/16  7:44 AM  Result Value Ref Range Status   Specimen Description BLOOD RIGHT ARM  Final   Special Requests IN PEDIATRIC BOTTLE Blood Culture adequate volume  Final   Culture  Setup Time   Final    GRAM POSITIVE COCCI IN CLUSTERS IN PEDIATRIC BOTTLE CRITICAL RESULT CALLED TO, READ BACK BY AND VERIFIED WITH: G.ABBOTT, PHARMD 11/10/16 0017 L.CHAMPION    Culture (A)  Final    STAPHYLOCOCCUS AUREUS SUSCEPTIBILITIES PERFORMED ON PREVIOUS CULTURE WITHIN THE LAST 5 DAYS. Performed at [facility], [address], [place], [place] [address]    Report Status  11/11/2016 FINAL  Final  Urine Culture     Status: Abnormal   Collection Time: 11/10/16 10:52 AM  Result Value Ref Range Status   Specimen Description URINE, RANDOM  Final   Special Requests NONE  Final   Culture <10,000 COLONIES/mL INSIGNIFICANT GROWTH (A)  Final   Report Status 11/11/2016 FINAL  Final  Culture, blood (Routine X 2) w Reflex to ID Panel     Status: Abnormal   Collection Time: 11/11/16  6:44 PM  Result Value Ref Range Status   Specimen Description BLOOD LEFT HAND  Final   Special Requests   Final    BOTTLES DRAWN AEROBIC AND ANAEROBIC Blood Culture adequate volume   Culture  Setup Time   Final    GRAM POSITIVE COCCI IN CLUSTERS ANAEROBIC BOTTLE ONLY CRITICAL VALUE NOTED.  VALUE IS CONSISTENT WITH PREVIOUSLY REPORTED AND CALLED VALUE.    Culture (A)  Final    STAPHYLOCOCCUS AUREUS SUSCEPTIBILITIES PERFORMED ON PREVIOUS CULTURE WITHIN THE LAST 5 DAYS.    Report Status 11/14/2016 FINAL  Final  Culture, blood (Routine X 2) w Reflex to ID Panel     Status: Abnormal   Collection Time: 11/11/16  6:44 PM  Result Value Ref Range Status   Specimen Description BLOOD LEFT ARM  Final   Special Requests IN PEDIATRIC BOTTLE Blood Culture adequate volume  Final   Culture  Setup Time   Final    GRAM POSITIVE COCCI IN CLUSTERS IN PEDIATRIC BOTTLE Organism ID to follow CRITICAL RESULT CALLED TO, READ BACK BY AND VERIFIED WITH: H BAIRD 11/12/16 @ 1326 M VESTAL    Culture STAPHYLOCOCCUS AUREUS (A)  Final   Report Status 11/14/2016 FINAL  Final   Organism ID, Bacteria STAPHYLOCOCCUS AUREUS  Final      Susceptibility   Staphylococcus aureus - MIC*    CIPROFLOXACIN <=0.5 SENSITIVE Sensitive     ERYTHROMYCIN >=8 RESISTANT Resistant     GENTAMICIN <=0.5 SENSITIVE Sensitive     OXACILLIN 0.5 SENSITIVE Sensitive     TETRACYCLINE <=1 SENSITIVE Sensitive     VANCOMYCIN <=0.5 SENSITIVE Sensitive     TRIMETH/SULFA <=10 SENSITIVE Sensitive     CLINDAMYCIN RESISTANT Resistant      RIFAMPIN <=0.5 SENSITIVE Sensitive     Inducible Clindamycin POSITIVE Resistant     * STAPHYLOCOCCUS AUREUS  Blood Culture ID Panel (Reflexed)     Status: Abnormal   Collection Time: 11/11/16  6:44 PM  Result Value Ref Range Status   Enterococcus species NOT DETECTED NOT DETECTED Final   Vancomycin resistance NOT DETECTED NOT DETECTED Final   Listeria monocytogenes NOT DETECTED NOT DETECTED Final   Staphylococcus species DETECTED (A) NOT DETECTED Final    Comment: CRITICAL RESULT CALLED TO, READ BACK BY AND VERIFIED WITH: H BAIRD 11/12/16 @ 1326 M VESTAL    Staphylococcus aureus DETECTED (A) NOT DETECTED Final    Comment: CRITICAL RESULT CALLED TO, READ BACK BY AND VERIFIED WITH: H BAIRD 11/12/16 @ 1326 M VESTAL    Methicillin resistance NOT DETECTED NOT DETECTED Final   Streptococcus species NOT DETECTED NOT DETECTED Final   Streptococcus agalactiae NOT DETECTED NOT DETECTED Final   Streptococcus pneumoniae NOT DETECTED NOT DETECTED Final   Streptococcus pyogenes NOT DETECTED NOT DETECTED Final   Acinetobacter baumannii NOT DETECTED NOT DETECTED Final   Enterobacteriaceae species NOT DETECTED NOT DETECTED Final   Enterobacter cloacae complex NOT DETECTED NOT DETECTED Final   Escherichia coli NOT DETECTED NOT DETECTED Final   Klebsiella oxytoca NOT DETECTED NOT DETECTED Final   Klebsiella pneumoniae NOT DETECTED NOT DETECTED Final   Proteus species NOT DETECTED  NOT DETECTED Final   Serratia marcescens NOT DETECTED NOT DETECTED Final   Carbapenem resistance NOT DETECTED NOT DETECTED Final   Haemophilus influenzae NOT DETECTED NOT DETECTED Final   Neisseria meningitidis NOT DETECTED NOT DETECTED Final   Pseudomonas aeruginosa NOT DETECTED NOT DETECTED Final   Candida albicans NOT DETECTED NOT DETECTED Final   Candida glabrata NOT DETECTED NOT DETECTED Final   Candida krusei NOT DETECTED NOT DETECTED Final   Candida parapsilosis NOT DETECTED NOT DETECTED Final   Candida tropicalis  NOT DETECTED NOT DETECTED Final  Culture, blood (Routine X 2) w Reflex to ID Panel     Status: None (Preliminary result)   Collection Time: 11/12/16  4:30 PM  Result Value Ref Range Status   Specimen Description BLOOD LEFT ARM  Final   Special Requests   Final    BOTTLES DRAWN AEROBIC AND ANAEROBIC Blood Culture results may not be optimal due to an excessive volume of blood received in culture bottles   Culture NO GROWTH 4 DAYS  Final   Report Status PENDING  Incomplete  Culture, blood (Routine X 2) w Reflex to ID Panel     Status: None (Preliminary result)   Collection Time: 11/12/16  4:42 PM  Result Value Ref Range Status   Specimen Description BLOOD LEFT WRIST  Final   Special Requests   Final    BOTTLES DRAWN AEROBIC AND ANAEROBIC Blood Culture results may not be optimal due to an excessive volume of blood received in culture bottles   Culture NO GROWTH 4 DAYS  Final   Report Status PENDING  Incomplete    Lorenso CourierVahini Ashelynn Marks, MD [facility] [phone] pager   [phone] cell 11/17/2016, 9:47 AM

## 2016-11-17 NOTE — Discharge Instructions (Signed)
Endocarditis Endocarditis is an infection of the inner layer of the heart (endocardium) or an infection of the heart valves. Endocarditis can cause growths inside the heart or on the heart valves. Over time, these growths can destroy heart tissue and cause heart failure or problems with heart rhythm. They can also cause stroke if they break away and form a blood clot in the brain. Early treatment offers the best chance for curing endocarditis and preventing complications. What are the causes? This condition may be caused by:  Germs that normally live in or on your body. The germs that most commonly cause endocarditis are bacteria.  A fungus.  What increases the risk? This condition is more likely to develop in people who have:  A heart defect.  Artificial (prosthetic) heart valves.  An abnormal or damaged heart valve.  A history of endocarditis.  Having certain procedures may also increase the risk of germs getting into the heart or bloodstream. What are the signs or symptoms? Symptoms of this condition may start suddenly, or they may start slowly and gradually get worse. Symptoms include:  Fever.  Chills.  Night sweats.  Muscle aches.  Fatigue.  Weakness.  Shortness of breath.  Chest pain.  Blood spots in the eyes.  Bleeding under the fingernails or toenails.  Painless red spots on the palms.  Painful lumps in the fingertips or toes.  Swelling in the feet or ankles.  How is this diagnosed? This condition may be diagnosed based on:  A physical exam. Your health care provider will listen to your heart to check for abnormal heart sounds (murmur). He or she may also use a scope to check for bleeding at the back of your eyes (retinas).  Tests. They may include: ? Blood tests to look for the germs that cause endocarditis. ? Imaging tests. A chest x-ray, CT scan, or echocardiogram may be used to create an image of your heart. A type of echocardiogram called a  transesophageal echocardiogram may be done to look at certain heart valves more closely.  How is this treated? Treatment for this condition depends on the cause of the endocarditis. Treatment may include:  Antibiotic medicines. These may be given through a tube into one of your veins (IV antibiotics) or taken by mouth. You may need to be on more than one antibiotic medicine.  Surgery to replace your heart valve. You may need surgery if: ? The endocarditis does not respond to treatment. ? You develop complications. ? Your heart valve is severely damaged.  Follow these instructions at home: Medicines  Take over-the-counter and prescription medicines only as told by your health care provider.  If you were prescribed an antibiotic medicine, take it as told by your health care provider. Do not stop taking the antibiotic even if you start to feel better. You may need to be on intravenous antibiotics for several weeks.  Do not use IV drugs unless it is part of your medical treatment. Lifestyle  Do not get tattoos or body piercings.  Practice good oral hygiene. This includes: ? Brushing and flossing regularly. ? Scheduling routine dental appointments.  Do not use any products that contain nicotine or tobacco, such as cigarettes and e-cigarettes. If you need help quitting, ask your health care provider.  Limit alcohol intake to no more than 1 drink a day for nonpregnant women and 2 drinks a day for men. One drink equals 12 oz of beer, 5 oz of wine, or 1 oz of hard liquor.   General instructions  Let your health care provider know before you have any dental or surgical procedures. You may need to take antibiotics before the procedure.  Tell all of your health care providers, including your dentist, that you have had endocarditis.  Gradually resume your usual activities.  Keep all follow-up visits as told by your health care provider. This is important. Contact a health care provider  if:  You have a fever.  Your symptoms do not improve.  Your symptoms get worse.  Your symptoms come back. Get help right away if:  You have trouble breathing.  You have chest pain.  You have symptoms of a stroke. These include: ? Sudden weakness. ? Numbness. ? Confusion. ? Trouble talking or understanding. ? A severe headache. Summary  Endocarditis is an infection of the inner layer of the heart (endocardium) or heart valves. It is caused by bacteria or a fungus.  Having certain heart conditions or procedures may increase the risk of endocarditis.  Antibiotics are an important treatment for endocarditis. Take these medicines as told by your health care provider. Do not stop taking them even if you start to feel better.  Tell all of your health care providers, including your dentist, that you have had endocarditis. This information is not intended to replace advice given to you by your health care provider. Make sure you discuss any questions you have with your health care provider. Document Released: 03/21/2005 Document Revised: 01/01/2016 Document Reviewed: 01/01/2016 Elsevier Interactive Patient Education  2018 [employer]  

## 2016-11-17 NOTE — Progress Notes (Signed)
Pt discharged to home via w/c accompanied by brother and mother, condition stable, prescriptions for abx and suboxone given to pt, printed education material given and reviewed, verbal understanding of all education materials and follow up appoints given by pt and pts mother.  Raymon MuttonGwen Karianna Gusman RN

## 2016-11-17 NOTE — Progress Notes (Signed)
  Date: 11/17/2016  Patient name: Cody Rios  Medical record number: [id number]  Date of birth: 1990-12-15    I spoke with Dr. Mahala MenghiniSamtani who tells me the patient is likely to discharge to home today. Cody Rios has been on Suboxone 16mg  daily since 8/10 for severe opioid use disorder and is doing very well with no withdrawal symptoms and his cravings are reportedly controlled. He tells me that he wants to continue with Suboxone, he thinks it will be great help to prevent him from relapsing when he leaves the hospital. He does reports feeling very anxious to leave, he says he can't stand being cooped up in a hospital room. He reports a desire to follow-up with [facility] in the [facility]. We talked about the cost of Suboxone out of pockets and potential pharmacy assistance programs if he proves that he is committed to treatment.   He does have a new rash on his lower extremities which looks like petechiae. 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 I doubt it's related to the Suboxone. Blood cultures have been clear since 8/11. Cardiothoracic surgery did not see an indication for intervention. Infectious disease is recommending a prolonged course of linezolid as an at-home treatment option for his endocarditis.  I gave the patient a paper prescription for Suboxone 8 mg films, take 2 films per day, I gave him a one-week supply #14 films. [facility] at 11:00am. I gave him our phone number to contact with any issues. He tells me that his cell number is [phone], but does not have VM. His mother is Cody Rios  [phone] who is another good contact in case, and she is aware of his OUD treatment.  [name], Duncan Thomas, MD 11/17/2016, 1:26 PM Pager: [phone]

## 2016-11-22 ENCOUNTER — Ambulatory Visit (INDEPENDENT_AMBULATORY_CARE_PROVIDER_SITE_OTHER): Payer: Self-pay | Admitting: Internal Medicine

## 2016-11-22 VITALS — BP 126/106 | HR 92 | Temp 97.8°F | Ht 74.0 in | Wt 158.4 lb

## 2016-11-22 DIAGNOSIS — I079 Rheumatic tricuspid valve disease, unspecified: Secondary | ICD-10-CM

## 2016-11-22 DIAGNOSIS — F1121 Opioid dependence, in remission: Secondary | ICD-10-CM | POA: Insufficient documentation

## 2016-11-22 DIAGNOSIS — I368 Other nonrheumatic tricuspid valve disorders: Secondary | ICD-10-CM

## 2016-11-22 DIAGNOSIS — F1120 Opioid dependence, uncomplicated: Secondary | ICD-10-CM

## 2016-11-22 DIAGNOSIS — B1920 Unspecified viral hepatitis C without hepatic coma: Secondary | ICD-10-CM

## 2016-11-22 HISTORY — DX: Opioid dependence, uncomplicated: F11.20

## 2016-11-22 HISTORY — DX: Unspecified viral hepatitis C without hepatic coma: B19.20

## 2016-11-22 MED ORDER — BUPRENORPHINE HCL-NALOXONE HCL 8-2 MG SL SUBL: 1.0000 | SUBLINGUAL_TABLET | Freq: Two times a day (BID) | SUBLINGUAL | 0 refills | Status: DC

## 2016-11-22 NOTE — Assessment & Plan Note (Addendum)
Patient with severe opioid use disorder who has undergone induction with suboxone while hospitalized for endocarditis earlier this month. He has been doing well on current dose of 16mg  suboxone daily. He is having some mild withdrawal symptoms early in the morning that are likely due to taking PM dose of suboxone too early.We discussed terms of treatment with suboxone through our clinic; patient agreed and contract was signed by Dr. Criselda Peaches and patient which will be scanned into his chart; patient was given copy of contract.   Plan: --given script for suboxone 8-2mg  SL tablet 16mg  daily, #14 --f/u urine tox --f/u in 1 week

## 2016-11-22 NOTE — Assessment & Plan Note (Signed)
Patient advised to f/u with [facility] for Hep C infection for further management.

## 2016-11-22 NOTE — Progress Notes (Signed)
11/22/2016  Cody Rios presents for buprenorphine/naloxone intake visit.   I have reviewed EPIC data including labwork which was available.  I have reviewed outside records provided by patient if available.  The salient points were confirmed with the patient.    Patient was seen by Dr. 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 Maintenance dose of 16mg  daily was established and patient was given suboxone 8-2mg  films BID #14 at discharge. Today he returns for f/u for opioid use disorder. Patient states that he has overall done well with current dose of suboxone and his cravings have been well controlled. He does report waking up at ~4am each morning with withdrawal symptoms of rhinorrhea and irritability; he usually takes his suboxone at 9am and about 5-6pm each day. He denies illicit drug use since prior to hospitalization. He lives with his parents currently who serve as his support system; he also has a couple of friends who do not use illicit substances whom he could turn to for support as well. He states understanding of necessity of avoiding environment of drug use (and lists some of his friends who are active IV drug users) for his continued abstinence of illicit drug use. His mom currently keeps his suboxone and plans on attending his OUD clinic appointments in the foreseeable future and patient is in agreement with this plan.  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  Last substance used: 11/09/16 - heroin, injected  If last substance not an opioid, last opioid used (type, dose, route, withdrawal symptoms): Xanex in July,  PO  Mental Health History: denies  Current counseling/behavioural health provider: none  This patient has Opioid Use Disorder by following DSM-V criteria:  - Opioids taken in larger amounts or over a longer period than intended - Persistent desire to cut down - A great deal of time is spent to obtain/use/recover from the opioid - Cravings to use opioids - Use resulting in a failure to fulfill major role obligations - Continue opioid use despite persistent social or interpersonal problems - Important activities are given up or reduced because of opioid use - Use despite knowledge of health problems caused by opioids - Tolerance - Withdrawal  Past Medical History:  Diagnosis Date  . Endocarditis   . Hepatitis C   . Pneumonia 11/10/2016    Current Outpatient Prescriptions on File Prior to Visit  Medication Sig Dispense Refill  . diphenhydrAMINE (BENADRYL) 25 mg capsule Take 1 capsule (25 mg total) by mouth every 6 (six) hours as needed for itching or allergies. 15 capsule 0  . ibuprofen (ADVIL,MOTRIN) 800 MG tablet Take 1 tablet (800 mg total) by mouth every 8 (eight) hours as needed. (Patient not taking: Reported on 11/10/2016) 21 tablet 0  . linezolid (ZYVOX) 600 MG tablet Take 1 tablet (600 mg total) by mouth every 12 (twelve) hours. 58 tablet 0  . pantoprazole (PROTONIX) 40 MG tablet Take 1 tablet (40 mg total) by mouth daily. 30 tablet 0   No current facility-administered medications on file prior to visit.     Physical Exam  Vitals:   11/22/16 1134  BP: (!) 126/106  Pulse: 92  Temp: 97.8 F (36.6 C)  TempSrc: Oral  SpO2: 98%  Weight: 158 lb 6.4 oz (71.8 kg)  Height: 6\' 2"  (1.88 m)   Constitutional: NAD, pleasant CV: RRR, systolic murmur Resp: CTAB, no increased work of breathing Skin: excoriated, papular rash on bil LEs; old track marks on bil antecubital areas  Clinical Opiate Withdrawal Scale: bold applicable COWS scoring   - Resting HR:    - 0 for < 80   -  1 for 81 - 100   - 2 for 101 - 120   - 4 for > 120  - Sweating:   - 0 for no chills/flushing   - 1 for subjective chills/flushing   - 3 for beads of sweat on brow/face   - 4 for sweat streaming off of face  - Restlessness:    - 0 for able to sit still   - 1 for subjective difficulty sitting still   - 3 for frequent shifting or extraneous movement   - 5 for unable to sit still for more than a few seconds  - Pupil size:    - 0 for pinpoint or normal   - 1 for possibly larger than normal   - 2 for moderately dilated   - 5 for only iris rim visible  - Bone/joint pain:    - 0 for not present   - 1 for mild diffuse discomfort   - 2 severe diffuse aching   - 4 for objectively rubbing joints/muscles and obviously in pain  - Runny nose/tearing:    - 0 for not present   - 1 for stuffy nose/moist eyes   - 2 for nose running/tearing   - 4 for nose constantly running or tears streaming down cheeks  - GI Upset:    - 0 for no GI symptoms   - 1 for stomach cramps   - 2 for nausea or loose stool   - 3 for vomiting or diarrhea   - 5 for multiple episodes of vomiting or diarrhea  - Tremor observation of outstretched hands:    - 0 for no tremor   - 1 for tremor can be felt but not observed   - 2 for slight tremor observable   - 4 for gross tremor or muscle twitching  - Yawning:    - 0 for no yawning   - 1 for yawning once or twice during assessment   - 2 for yawning three or more times during assessment   - 4 for yawning several times per minute  - Anxiety or irritability:    - 0 for none   - 1 for patient reports increasing irritability or anxiousness   - 2 for patient obviously irritable/anxious   - 4 for patient so irritable/anxious that assessment is difficult  - Gooseflesh:    - 0 for skin is smooth   - 3 for piloerection of skin can be felt or seen   - 5 for prominent piloerection  TOTAL: 2  Assessment/Plan:   Based on a review of the patient's medical history including  substance use and mental health factors, and physical exam, Tige A Ran is a suitable candidate for MAT with buprenorphine/naloxone. UDS ordered this visit. Suboxone contract discussed, signed by patient and Dr. Criselda Peaches; patient received copy.   I have discussed HIV and Hepatitis C screening with this patient. HIV screen was nonreactive on 11/09/2016. He is Hep C positive, genotype 1a and for f/u with [facility] for  management.    We discussed this medication must be kept in a safe place and away from children.   We will see the patient back in 1 week in clinic, with options for a sooner appointment based on patient and provider preference.   Patient was encouraged to call the office and speak with the MD on call for any urgent concerns.    Nyra Market, MD 11/22/2016 1:18 PM

## 2016-11-22 NOTE — Patient Instructions (Signed)
Please take one sublingual suboxone tablet twice a day. You have enough to last your until your next visit with [place] next week.   We will plan on seeing you next week. It was nice meeting you!

## 2016-11-22 NOTE — Assessment & Plan Note (Addendum)
Patient compliant with zyvox therapy; he does not yet have a f/u appt with [facility] but plans on calling them today. He continues to have a rash on bil LEs that is improved per patient; on exam no signs of focal infection noted.  Plan: --to f/u with [facility] --advised use of benadryl for rash and avoid scratching

## 2016-11-24 NOTE — Progress Notes (Signed)
Internal Medicine Clinic Attending  I saw and evaluated the patient.  I personally confirmed the key portions of the history and exam documented by Dr. Samuella Cota and I reviewed pertinent patient test results.  The assessment, diagnosis, and plan were formulated together and I agree with the documentation in the resident's note.  Cody Rios was seen initially by Dr. Oswaldo Done in the hospital and initiated on suboxone.  He has been doing very well on this therapy and looks very comfortable today. He presented with his family member, mother, who is helping him through this period.  He seems motivated to be in treatment and has been successful on methadone in the past.  I reviewed Dr. Kandice Hams notes.  She went through the Treatment Contract with him.  We reviewed the [place] narcotic database which was appropriate. He denies any recent illicit substance use and reports that he has friends who do not currently use substances and he is trying to avoid environments where drug use is occurring.  I agree that he is an appropriate candidate for Suboxone therapy. He will be seen in 1 week.  A prescription for Suboxone 8-2mg  BID was given to him today for a 7 day supply.

## 2016-11-26 LAB — TOXASSURE SELECT,+ANTIDEPR,UR

## 2016-11-29 ENCOUNTER — Ambulatory Visit (INDEPENDENT_AMBULATORY_CARE_PROVIDER_SITE_OTHER): Payer: Self-pay | Admitting: Internal Medicine

## 2016-11-29 ENCOUNTER — Encounter: Payer: Self-pay | Admitting: Internal Medicine

## 2016-11-29 VITALS — BP 118/74 | Temp 98.5°F | Resp 20 | Wt 159.7 lb

## 2016-11-29 DIAGNOSIS — F1123 Opioid dependence with withdrawal: Secondary | ICD-10-CM

## 2016-11-29 DIAGNOSIS — F17220 Nicotine dependence, chewing tobacco, uncomplicated: Secondary | ICD-10-CM

## 2016-11-29 DIAGNOSIS — R040 Epistaxis: Secondary | ICD-10-CM | POA: Insufficient documentation

## 2016-11-29 DIAGNOSIS — Z79891 Long term (current) use of opiate analgesic: Secondary | ICD-10-CM

## 2016-11-29 DIAGNOSIS — F1120 Opioid dependence, uncomplicated: Secondary | ICD-10-CM

## 2016-11-29 MED ORDER — BUPRENORPHINE HCL-NALOXONE HCL 8-2 MG SL SUBL: SUBLINGUAL_TABLET | SUBLINGUAL | 0 refills | Status: DC

## 2016-11-29 NOTE — Assessment & Plan Note (Signed)
Patient endorses daily nosebleeds - he initially had them in the hospital from the left nostril, but now they are on the right; he notices post-nasal drainage of blood. Evidence of recent nasal bleeding on exam. It is likely 2/2 frequent nose blowing from his early morning congestions/rhinorrhea due to withdrawal.  Plan: --advised use of flonase daily and saline nasal spray a few times a day --will f/u in 1 week; if not resolved, will consider Afrin &/or referral to ENT

## 2016-11-29 NOTE — Progress Notes (Signed)
   11/29/2016  Cody Rios presents for follow up of opioid use disorder I have reviewed the prior induction visit, follow up visits, and telephone encounters relevant to opiate use disorder (OUD) treatment.   Current daily dose: 16mg  buprenorphine daily  Date of Induction: 11/11/2016  Current follow up interval, in weeks: 1  The patient has been adherent with the buprenorphine for OUD contract.   Last UDS Result: appropriate for buprenorphine; +methamphetamine below level of confirmation  HPI:  Cody Rios is a 26yo male here for f/u on his opioid use disorder. Patient is currently on 16mg  of Suboxone daily. Patient states that he is still experiencing symptoms of withdrawal early in the morning even with moving his evening dose of Suboxone to 8-9pm. He denies significant cravings; he feels he is slowly recovering from his hospitalization. On addressing the low level of methamphetamine in his UDS from last week, patient denies prior use; he does endorse using Adderral several weeks ago.  He continues to have a LE rash which has improved and is not as bothersome but has noticed some extension to the lateral aspects of his feet; he denies other sites of rash.   He endorses daily nosebleeds - he initially had them in the hospital from the left nostril, but now they are on the right; he notices post-nasal drainage of blood.   [place] controlled substances database was reviewed today and appropriate.  Exam:   Vitals:   11/29/16 1029  BP: 118/74  Resp: 20  Temp: 98.5 F (36.9 C)  TempSrc: Oral  SpO2: 97%  Weight: 159 lb 11.2 oz (72.4 kg)   Constitutional: NAD, pleasant HEENT: oropharynx clear, with moist mucous membranes; bilateral nasal passages erythematous, R with evidence of recent bleeding and small amount of clotted blood CV: RRR, +murmur, no rubs or gallops, pulses intact Resp: CTAB, no increased work of breathing Ext: warm, healing excoriations on bil LEs, does not  appear to have new lesions Psych: A&Ox3, not anxious or depressed appearing  Assessment/Plan:  See Problem Based Charting in the Encounters Tab   Nyra Market, MD  11/29/2016  10:31 AM

## 2016-11-29 NOTE — Assessment & Plan Note (Addendum)
Patient with severe opioid use disorder who is still experiencing mild withdrawal symptoms early in the mornings on current 16mg  buprenorphine dosing. His cravings are well controlled and he denies illicit drug use. UDS from last week did show methamphetamine below level of confirmation which patient denies prior use of.   [place] controlled substance database was reviewed today and appropriate.  We are still awaiting word form drug manufacturer on suboxone cost waiver for patient.  Plan: --increase buprenorphine-naloxone 8-2mg  to 20mg  daily dose (1 tab BID and 1/2 at night) --f/u UDS --f/u in 1 week

## 2016-11-29 NOTE — Patient Instructions (Signed)
For your nosebleeds, use over the counter flonase spray daily and saline spray several times a day.   Increase your suboxone to one tab twice a day and half a tab at night.  We will see you in one week, or sooner if you need [place].

## 2016-11-30 NOTE — Progress Notes (Signed)
Internal Medicine Clinic Attending  I saw and evaluated the patient.  I personally confirmed the key portions of the history and exam documented by Dr. Samuella Rios and I reviewed pertinent patient test results.  The assessment, diagnosis, and plan were formulated together and I agree with the documentation in the resident's note.  [name] is a young man with moderate opioid use disorder who has done well since induction.  He has some return of symptoms and wear off of the Suboxone in the evening and early morning with congestion and blowing his nose.  Since the congestion has gotten worse, he has also been developing nosebleeds which are new for him.  He denies picking his nose.  He does not have frank blood, but will taste blood in the back of his throat.  On exam, he has very irritated septum and an area of clotting in the anterior nare on the right.  He has not tried anything for this.  Given his uncontrolled withdrawal symptoms, congestion and now nosebleeds, we opted to increase his suboxone to 2.5 tabs per day to better control symptoms.  We advised him about nasal hygeine and he will be back in a week to further evaluate.  His UDS and [place] narcotic database were appropriate.  Overall he is doing well.  RTC in 1 week.

## 2016-12-04 LAB — TOXASSURE SELECT,+ANTIDEPR,UR

## 2016-12-06 ENCOUNTER — Ambulatory Visit (INDEPENDENT_AMBULATORY_CARE_PROVIDER_SITE_OTHER): Payer: Self-pay | Admitting: Internal Medicine

## 2016-12-06 VITALS — BP 122/82 | HR 103 | Temp 98.2°F | Resp 97 | Ht 74.0 in | Wt 164.8 lb

## 2016-12-06 DIAGNOSIS — R040 Epistaxis: Secondary | ICD-10-CM

## 2016-12-06 DIAGNOSIS — Z79899 Other long term (current) drug therapy: Secondary | ICD-10-CM

## 2016-12-06 DIAGNOSIS — F17220 Nicotine dependence, chewing tobacco, uncomplicated: Secondary | ICD-10-CM

## 2016-12-06 DIAGNOSIS — F1120 Opioid dependence, uncomplicated: Secondary | ICD-10-CM

## 2016-12-06 DIAGNOSIS — B1710 Acute hepatitis C without hepatic coma: Secondary | ICD-10-CM

## 2016-12-06 DIAGNOSIS — Z79891 Long term (current) use of opiate analgesic: Secondary | ICD-10-CM

## 2016-12-06 DIAGNOSIS — R21 Rash and other nonspecific skin eruption: Secondary | ICD-10-CM

## 2016-12-06 MED ORDER — BUPRENORPHINE HCL-NALOXONE HCL 8-2 MG SL SUBL: SUBLINGUAL_TABLET | SUBLINGUAL | 0 refills | Status: DC

## 2016-12-06 NOTE — Assessment & Plan Note (Signed)
Patient continues to have nosebleeds unrelieved by Flonase and saline spray. Reports almost daily post-nasal drainage of blood.  -- ENT referral

## 2016-12-06 NOTE — Assessment & Plan Note (Signed)
Scheduled to establish with ID on 12/20/16. Encouraged patient to keep appointment.

## 2016-12-06 NOTE — Assessment & Plan Note (Addendum)
Patient with severe opioid use disorder who is doing well on current 20 mg daily buprenorphine dosing. He currently takes 8 mg BID plus and additional 4 mg QHS. He denies any cravings, withdrawal, or illicit substance use. UDS from last week was appropriate. [place] controlled substance database was reviewed today and appropriate. We are still awaiting drug manufacturer suboxone cost waiver for patient; Dr. Selena BattenKim is resubmitting application today.  -- F/u UDS -- Continue buprenorphine-naloxone 8-2 mg  -- F/u 1 week   ADDENDUM: UDS results are appropriate. Repeat at follow up.

## 2016-12-06 NOTE — Progress Notes (Signed)
   12/06/2016  Cody Rios presents for follow up of opioid use disorder I have reviewed the prior induction visit, follow up visits, and telephone encounters relevant to opiate use disorder (OUD) treatment.   Current daily dose: 20 mg buprenorphine daily   Date of Induction: 11/11/2016  Current follow up interval, in weeks: 1  The patient has been adherent with the buprenorphine for OUD contract.   Last UDS Result: Appropriate for buprenorphine and norbuprenorphine   HPI: Patient is a 26 yo M here for follow up of his opioid use disorder. He is currently taking 8 mg of buprenorphine BID plus 4 mg QHS. His dose was increased at his last visit to include bedtime coverage due to withdrawal symptoms early in the morning. He is now doing well with this dose and denies significant cravings. He denies relapse and illicit substance use.    He continues to have persistent bilateral LE rash. He denies pain and itching.   Patient also continues to have daily nosebleeds unrelieved by flonase and saline spray. He notices post-nasal drainage of blood.   [place] controlled substance database was reviewed today and appropriate.   Exam:   Vitals:   12/06/16 0850  BP: 122/82  Pulse: (!) 103  Resp: (!) 97  Temp: 98.2 F (36.8 C)  TempSrc: Oral  Weight: 164 lb 12.8 oz (74.8 kg)  Height: 6\' 2"  (1.88 m)    Physical Exam Constitutional: NAD, appears comfortable Cardiovascular: RRR, no murmurs, rubs, or gallops.  Pulmonary/Chest: CTAB  Extremities: Warm and well perfused.  No edema..  Skin: Bilateral lower extremity rash  Psychiatric: Normal mood and affect  Assessment/Plan:  See Problem Based Charting in the Encounters Tab   Cody Rios, [name], MD  12/06/2016  9:09 AM

## 2016-12-06 NOTE — Patient Instructions (Signed)
Mr. [name],  It was a pleasure to see you today. I am glad you are doing well. Please continue to take your prescription as previously prescribed and return for follow up in 1 week. If you have any questions or concerns, call our clinic at [phone] or after hours call [phone] and ask for the internal medicine resident on call. Thank you!  - Dr. Antony ContrasGuilloud

## 2016-12-07 ENCOUNTER — Encounter: Payer: Self-pay | Admitting: Pharmacist

## 2016-12-07 NOTE — Progress Notes (Signed)
Great news! Thanks

## 2016-12-07 NOTE — Progress Notes (Signed)
Patient was approved for Suboxone patient assistance program. Contacted patient to give him the number to call [phone]) in order to access his suboxone ID number and fill the prescription at the pharmacy. Advised patient to contact clinic if any questions. Patient verbalized understanding.

## 2016-12-08 NOTE — Progress Notes (Signed)
Internal Medicine Clinic Attending  I saw and evaluated the patient.  I personally confirmed the key portions of the history and exam documented by Dr. Antony ContrasGuilloud and I reviewed pertinent patient test results.  The assessment, diagnosis, and plan were formulated together and I agree with the documentation in the resident's note.  [name] is a young man with Moderate-Severe OUD.  He has been in treatment for about 3 weeks and is doing well.  UDS was appropriate at last visit.  We reviewed the [place] narcotic database which was also appropriate.  We are attempting to get him Suboxone free for 1 year to assist him and we discussed with our pharmacist today. He continues to have some wear off effect early in the morning, but is overall doing better on the 20mg  buprenorphine dosing per day.  He continues to have mild nosebleeds, he has erythema anteriorly in the bilateral nares, worse on the right.  These do not seem to be getting better with symptomatic treatment.  He has a rash on the right leg which is slowly improving.  He is due to see ID in the near future.  He has good home support and is doing well on buprenorphine therapy.  Follow up in 1 week.

## 2016-12-10 LAB — TOXASSURE SELECT,+ANTIDEPR,UR

## 2016-12-13 ENCOUNTER — Ambulatory Visit (INDEPENDENT_AMBULATORY_CARE_PROVIDER_SITE_OTHER): Payer: Self-pay | Admitting: Internal Medicine

## 2016-12-13 VITALS — BP 126/85 | HR 98 | Temp 97.7°F | Resp 20 | Ht 74.0 in | Wt 168.4 lb

## 2016-12-13 DIAGNOSIS — R040 Epistaxis: Secondary | ICD-10-CM

## 2016-12-13 DIAGNOSIS — F1120 Opioid dependence, uncomplicated: Secondary | ICD-10-CM

## 2016-12-13 MED ORDER — BUPRENORPHINE HCL-NALOXONE HCL 8-2 MG SL FILM: 1.0000 | ORAL_FILM | Freq: Three times a day (TID) | SUBLINGUAL | 0 refills | Status: DC

## 2016-12-13 NOTE — Progress Notes (Signed)
   12/13/2016  [name] A Sage presents for follow up of opioid use disorder I have reviewed the prior induction visit, follow up visits, and telephone encounters relevant to opiate use disorder (OUD) treatment.   Current daily dose: 20 mg buprenorphine daily  Date of Induction: 11/11/2016  Current follow up interval, in weeks: 1  The patient has been adherent with the buprenorphine for OUD contract.   Last UDS Result: Appropriate for buprenorphine and metabolite   HPI: Patient is a 26 yo M here for follow up of his opioid use disorder. He is currently taking 8 mg of buprenorphine BID plus 4 mg QHS. Since his last visit, patient reports he has developed withdrawal symptoms when his dose begins to wear off. Before taking each tablet, patient reports symptoms of hot flashes and rhinorrhea. He denies cravings to use, however did experience a "user dream" last night that he attributes to withdrawal. He denies any relapse or illicit drug use.   Since his last visit, his nosebleeds have stopped.   [place] controlled substance database was reviewed today and appropriate.   Exam:   Vitals:   12/13/16 0921  BP: 126/85  Pulse: 98  Resp: 20  Temp: 97.7 F (36.5 C)  TempSrc: Oral  SpO2: 99%  Weight: 168 lb 6.4 oz (76.4 kg)  Height: 6\' 2"  (1.88 m)   Constitutional: NAD, appears comfortable Cardiovascular: RRR Pulmonary/Chest: CTAB Extremities: Warm and well perfused. No edema.  Psychiatric: Normal mood and affect   Assessment/Plan:  See Problem Based Charting in the Encounters Tab   [name], Sarah-Jane Nazario, MD  12/13/2016  9:35 AM

## 2016-12-13 NOTE — Patient Instructions (Signed)
[name],  It was a pleasure to see you today. We have increased the dose of your suboxone to 1 full tablet or film three times a day to help with your withdrawal symptoms. Please follow up with us in 2 weeks to reassess. If you have any questions or concerns, call our clinic at [phone] or after hours call [phone] and ask for the internal medicine resident on call. Thank you!  - Dr. Antony ContrasGuilloud

## 2016-12-14 NOTE — Assessment & Plan Note (Addendum)
Patient with severe opioid use disorder who was started on buprenorphine therapy on 11/11/16. He was previously doing well on 20 mg daily (8 mg BID plus 4 mg QHS) however since his last visit has begun to experience withdrawal symptoms of hot flashes and rhinorrhea when he dose begins to wear off. He denies cravings but did report a "user dream" last night that he attributes to withdrawal. Today we discussed increasing his dose to 8 mg TID and patient is agreeable. He is also requesting to change from tablets to films. -- Increase suboxone to 8-2 mg films TID -- F/u UDS -- [place] controlled substance database reviewed today and appropriate  -- Follow up 2 weeks   ADDENDUM: UDS appropriate for buprenorphine and metabolite.

## 2016-12-14 NOTE — Assessment & Plan Note (Signed)
Bleeding has resolved. Will cancel ENT referral.

## 2016-12-18 LAB — TOXASSURE SELECT,+ANTIDEPR,UR

## 2016-12-20 ENCOUNTER — Ambulatory Visit (INDEPENDENT_AMBULATORY_CARE_PROVIDER_SITE_OTHER): Payer: Self-pay | Admitting: Internal Medicine

## 2016-12-20 ENCOUNTER — Encounter: Payer: Self-pay | Admitting: Internal Medicine

## 2016-12-20 DIAGNOSIS — R7881 Bacteremia: Secondary | ICD-10-CM

## 2016-12-20 DIAGNOSIS — B9561 Methicillin susceptible Staphylococcus aureus infection as the cause of diseases classified elsewhere: Secondary | ICD-10-CM

## 2016-12-20 DIAGNOSIS — F1990 Other psychoactive substance use, unspecified, uncomplicated: Secondary | ICD-10-CM | POA: Insufficient documentation

## 2016-12-20 DIAGNOSIS — B1710 Acute hepatitis C without hepatic coma: Secondary | ICD-10-CM

## 2016-12-20 LAB — COMPREHENSIVE METABOLIC PANEL
AG RATIO: 1.3 (calc) (ref 1.0–2.5)
ALBUMIN MSPROF: 4.3 g/dL (ref 3.6–5.1)
ALKALINE PHOSPHATASE (APISO): 100 U/L (ref 40–115)
ALT: 87 U/L — ABNORMAL HIGH (ref 9–46)
AST: 64 U/L — ABNORMAL HIGH (ref 10–40)
BILIRUBIN TOTAL: 0.6 mg/dL (ref 0.2–1.2)
BUN: 15 mg/dL (ref 7–25)
CALCIUM: 9.6 mg/dL (ref 8.6–10.3)
CHLORIDE: 101 mmol/L (ref 98–110)
CO2: 27 mmol/L (ref 20–32)
Creat: 0.81 mg/dL (ref 0.60–1.35)
GLOBULIN: 3.2 g/dL (ref 1.9–3.7)
Glucose, Bld: 98 mg/dL (ref 65–99)
POTASSIUM: 4.2 mmol/L (ref 3.5–5.3)
SODIUM: 136 mmol/L (ref 135–146)
Total Protein: 7.5 g/dL (ref 6.1–8.1)

## 2016-12-20 LAB — PROTIME-INR
INR: 1.1
Prothrombin Time: 11.4 s (ref 9.0–11.5)

## 2016-12-20 NOTE — Assessment & Plan Note (Signed)
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 He will stay off of antibiotics and follow-up in 6 weeks.

## 2016-12-20 NOTE — Progress Notes (Signed)
[facility]  Patient Active Problem List   Diagnosis Date Noted  . IVDU (intravenous drug user) 12/20/2016  . Bleeding from the nose 11/29/2016  . Opioid use disorder, severe, on maintenance therapy (HCC) 11/22/2016  . Hepatitis C infection 11/22/2016  . 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 Acute hematogenous osteomyelitis of right foot (HCC)   . 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 Severe tricuspid regurgitation 11/10/2016  . Pulmonary HTN (HCC) 11/10/2016  . Multifocal pneumonia 11/10/2016  . ?? Septic pulmonary embolism (HCC) 11/10/2016  . Tenosynovitis of right wrist 11/10/2016  . Effusion of right knee 11/10/2016  . MSSA bacteremia 11/10/2016  . History of juvenile rheumatoid arthritis 11/10/2016  . Anemia 11/10/2016  . Abnormal urinalysis 11/10/2016    Patient's Medications  New Prescriptions   No medications on file  Previous Medications   BUPRENORPHINE HCL-NALOXONE HCL 8-2 MG FILM    Place 1 Film under the tongue 3 (three) times daily.   DIPHENHYDRAMINE (BENADRYL) 25 MG CAPSULE    Take 1 capsule (25 mg total) by mouth every 6 (six) hours as needed for itching or allergies.   PANTOPRAZOLE (PROTONIX) 40 MG TABLET    Take 1 tablet (40 mg total) by mouth daily.  Modified Medications   No medications on file  Discontinued Medications   No medications on file    Subjective: Cody Rios is in with his mother for his hospital follow-up visit. 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 He received 9 days of IV vancomycin then was changed to oral linezolid. He completed 4 weeks of oral linezolid yesterday. He is feeling much better. His joint pain is resolved. He has not had any fever, chills, sweats, cough or shortness of breath. He has been clean and drug free since discharge. He is  taking Suboxone but he is not in any type of drug treatment. 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 He has chronic hepatitis C with a viral load of 467,000. He has genotype 1a.  Review of Systems: Review of Systems  Constitutional: Negative for chills, diaphoresis, fever, malaise/fatigue and weight loss.  HENT: Negative for sore throat.   Respiratory: Negative for cough, sputum production and shortness of breath.   Cardiovascular: Negative for chest pain.  Gastrointestinal: Negative for abdominal pain, diarrhea, heartburn, nausea and vomiting.  Genitourinary: Negative for dysuria and frequency.  Musculoskeletal: Negative for joint pain and myalgias.  Skin: Negative for rash.  Neurological: Negative for dizziness and headaches.  Psychiatric/Behavioral: Negative for depression and substance abuse. The patient is not nervous/anxious.     Past Medical History:  Diagnosis Date  . Endocarditis   . Hepatitis C   . Pneumonia 11/10/2016    Social History  Substance Use Topics  . Smoking status: Never Smoker  . Smokeless tobacco: Current User    Types: Chew  . Alcohol use No    Family History  Problem Relation Age of Onset  . Hypertension Father     Allergies  Allergen Reactions  . Cephalosporins Hives  . Penicillins Hives, Shortness Of Breath and Rash    Objective: Vitals:   12/20/16 1000  BP: 121/82  Pulse: 87  Temp: 98 F (36.7 C)  TempSrc: Oral  Weight: 169 lb (76.7 kg)   Body mass index is 21.7 kg/m.  Physical Exam  Constitutional: He is oriented to person, place, and time.   He has gained 16 pounds since discharge.  HENT:  Mouth/Throat: No oropharyngeal exudate.  Eyes: Conjunctivae are normal.  Cardiovascular: Normal rate and regular rhythm.   No murmur heard. Pulmonary/Chest: Effort normal and breath sounds normal.  Abdominal: Soft. He exhibits no mass. There is no tenderness.    Musculoskeletal: Normal range of motion.  Neurological: He is alert and oriented to person, place, and time.  Skin: No rash noted.  Psychiatric: Mood and affect normal.    Lab Results    Problem List Items Addressed This Visit      Unprioritized   Hepatitis C infection    He has chronic hepatitis C. I will check a fibrosis test, INR and liver enzymes today. I have talked to him about the need to be in drug treatment, clean and sober in order to get access to treatment for hepatitis C. He spoke with our pharmacist, Ulyses Southward today. I will see him back in 6 weeks.      Relevant Orders   Liver Fibrosis, FibroTest-ActiTest   INR/PT   Comprehensive metabolic panel   IVDU (intravenous drug user)    I encouraged him to enter into drug treatment and continue Suboxone. I reviewed treatment options for him. He [place] in [place] with his parents.      MSSA bacteremia    I am hopeful that his MSSA bacteremia endocarditis, septic pulmonary emboli and early septic arthritis have now been cured. He will stay off of antibiotics and follow-up in 6 weeks.          Cliffton Asters, MD [facility] [phone] pager   [phone] cell 12/20/2016, 10:24 AM

## 2016-12-20 NOTE — Assessment & Plan Note (Signed)
He has chronic hepatitis C. I will check a fibrosis test, INR and liver enzymes today. I have talked to him about the need to be in drug treatment, clean and sober in order to get access to treatment for hepatitis C. He spoke with our pharmacist, Ulyses Southward today. I will see him back in 6 weeks.

## 2016-12-20 NOTE — Assessment & Plan Note (Addendum)
I encouraged him to enter into drug treatment and continue Suboxone. I reviewed treatment options for him. He lives in [place], [place] with his parents.

## 2016-12-22 LAB — LIVER FIBROSIS, FIBROTEST-ACTITEST
ALPHA-2-MACROGLOBULIN: 237 mg/dL (ref 106–279)
ALT: 83 U/L — ABNORMAL HIGH (ref 9–46)
Apolipoprotein A1: 149 mg/dL (ref 94–176)
Bilirubin: 0.3 mg/dL (ref 0.2–1.2)
Fibrosis Score: 0.22
GGT: 68 U/L (ref 3–70)
Haptoglobin: 74 mg/dL (ref 43–212)
NECROINFLAMMAT ACT SCORE: 0.46
Reference ID: 2115673

## 2016-12-23 NOTE — Addendum Note (Signed)
Addended by: Neomia Dear on: 12/23/2016 03:04 PM   Modules accepted: Orders

## 2016-12-26 NOTE — Addendum Note (Signed)
Addended by: Debe Coder B on: 12/26/2016 03:28 PM   Modules accepted: Level of Service

## 2016-12-26 NOTE — Progress Notes (Signed)
Internal Medicine Clinic Attending  I saw and evaluated the patient.  I personally confirmed the key portions of the history and exam documented by Dr. Antony Contras and I reviewed pertinent patient test results.  The assessment, diagnosis, and plan were formulated together and I agree with the documentation in the resident's note.  Cody Rios is doing very well on his treatment for OUD.  He is maintaining sobriety.  U tox have been appropriate.  Database review appropriate.  Continue 2 week follow up.

## 2016-12-27 ENCOUNTER — Ambulatory Visit (INDEPENDENT_AMBULATORY_CARE_PROVIDER_SITE_OTHER): Payer: Self-pay | Admitting: Student in an Organized Health Care Education/Training Program

## 2016-12-27 VITALS — BP 114/73 | HR 87 | Temp 98.2°F | Resp 20 | Wt 168.1 lb

## 2016-12-27 DIAGNOSIS — F1120 Opioid dependence, uncomplicated: Secondary | ICD-10-CM

## 2016-12-27 DIAGNOSIS — Z598 Other problems related to housing and economic circumstances: Secondary | ICD-10-CM

## 2016-12-27 DIAGNOSIS — Z8679 Personal history of other diseases of the circulatory system: Secondary | ICD-10-CM

## 2016-12-27 MED ORDER — BUPRENORPHINE HCL-NALOXONE HCL 8-2 MG SL FILM: 1.0000 | ORAL_FILM | Freq: Three times a day (TID) | SUBLINGUAL | 0 refills | Status: DC

## 2016-12-27 NOTE — Progress Notes (Signed)
   12/27/2016  Langdon A Martucci presents for follow up of opioid use disorder I have reviewed the prior induction visit, follow up visits, and telephone encounters relevant to opiate use disorder (OUD) treatment.   Current daily dose: Suboxone 24 mg daily  Date of Induction: 11/11/16  Current follow up interval, in weeks: 2  The patient has been adherent with the buprenorphine for OUD contract.   Last UDS Result: Appropriate  HPI: 26 year old man here for follow up of medication assisted treatment of severe opioid use disorder. He is doing very well, has been clean since 8/8 when he was admitted for tricuspid valve endocarditis. Infectious symptoms are now resolved, he saw Dr. Orvan Falconer in ID clinic last week. Opioid cravings are well controlled on current suboxone dosing. He is taking it appropriately. No illicit drug use reported. No fevers or chills. Not working yet, but wants to start soon. He is uninsured, but on the suboxone assistance program which is working well. He is looking into Narcotics Anonymous meetings in [place], has been in the past and finds some of them helpful.   Exam:   Vitals:   12/27/16 0906  BP: 114/73  Pulse: 87  Resp: 20  Temp: 98.2 F (36.8 C)  TempSrc: Oral  SpO2: 98%  Weight: 168 lb 1.6 oz (76.2 kg)    Gen: well appearing, no distress CV: faint holosystolic murmur at the LLSB  Assessment/Plan:  See Problem Based Charting in the Encounters Tab   [name], MD  12/27/2016  9:34 AM

## 2016-12-27 NOTE — Assessment & Plan Note (Signed)
Severe OUD is chronic and now stable on Suboxone therapy since 11/11/2016. He is doing very well with treatment at 24 mg daily. We will continue this dose, I gave him a two week supply. Urine tox ordered today. I reviewed the controlled substance database which shows appropriate dispensing.

## 2017-01-03 LAB — TOXASSURE SELECT,+ANTIDEPR,UR

## 2017-01-10 ENCOUNTER — Ambulatory Visit (INDEPENDENT_AMBULATORY_CARE_PROVIDER_SITE_OTHER): Payer: Self-pay | Admitting: Student in an Organized Health Care Education/Training Program

## 2017-01-10 VITALS — BP 115/74 | HR 66 | Temp 97.7°F | Resp 20 | Ht 72.0 in | Wt 168.7 lb

## 2017-01-10 DIAGNOSIS — F1120 Opioid dependence, uncomplicated: Secondary | ICD-10-CM

## 2017-01-10 MED ORDER — BUPRENORPHINE HCL-NALOXONE HCL 8-2 MG SL FILM: 1.0000 | ORAL_FILM | Freq: Three times a day (TID) | SUBLINGUAL | 0 refills | Status: DC

## 2017-01-10 NOTE — Assessment & Plan Note (Signed)
Severe oh. His disorder currently stable on medication assisted therapy since August 10. He's doing very well, he reports complete abstinence from illicits. Accompanied by his mother today. U tox at last visit was appropriate, and again collected today. Database is appropriate. Plan is to continue with Suboxone 8 mg 3 times a day, I gave him a two-week supply. Follow-up in 2 weeks. He is going to continue to look for local counseling services or NA meetings.

## 2017-01-10 NOTE — Progress Notes (Signed)
   01/10/2017  Earland A Schuknecht presents for follow up of opioid use disorder I have reviewed the prior induction visit, follow up visits, and telephone encounters relevant to opiate use disorder (OUD) treatment.   Current daily dose: Suboxone  daily  Date of Induction: 11/11/16  Current follow up interval, in weeks: 2  The patient has been adherent with the buprenorphine for OUD contract.   Last UDS Result: Appropriate  HPI: 26 -year-old man with severe opioid use disorder here for follow-up of medication assisted therapy. Patient has done very well over the last 2 weeks. He reports complete abstinence from illicits since his hospitalization in early August for endocarditis. He reports his cravings are well controlled. Good compliance with Suboxone with no side effects. He has been looking for local counseling services, but is having difficulty with transportation. His girlfriend also had a baby last week which is causing him to be short on extra time. 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 No other problems or concerns.   Exam:   Vitals:   01/10/17 0922  BP: 115/74  Pulse: 66  Resp: 20  Temp: 97.7 F (36.5 C)  TempSrc: Oral  SpO2: 99%  Weight: 168 lb 11.2 oz (76.5 kg)  Height: 6' (1.829 m)    Gen: well appearing, no distress.  Psych: good mood, looks a little tired. Well put together.  Neuro: conversational, oriented, normal strength throughout and normal gait.  Assessment/Plan:  See Problem Based Charting in the Encounters Tab   [name], MD  01/10/2017  9:45 AM

## 2017-01-17 LAB — TOXASSURE SELECT,+ANTIDEPR,UR

## 2017-01-19 ENCOUNTER — Telehealth: Payer: Self-pay | Admitting: *Deleted

## 2017-01-19 NOTE — Telephone Encounter (Signed)
Pt is working in [place] so he will not be here mon thru thurs I have ask him to find a pharmacy in [place] and I will call him tues am and then call it in to that pharm, he is agreeable

## 2017-01-19 NOTE — Telephone Encounter (Signed)
Pt calls and states he got his job and cannot come to clinic Tuesday am, he can come next Friday pm- 10/26, he is worried about getting his script he normally gets on tuesdays, he may need to start coming on fridays due to his work schedule, please advise. He states you may call him if you would like at [phone]  Sending to dr's mullen and vincent

## 2017-01-19 NOTE — Telephone Encounter (Signed)
We can make Friday's work based on his work schedule.  [facility] or PM based on his schedule.  If he cannot come for certain this coming Tuesday, I will have his updated prescription ready on Monday for 5 days and then we can see him Friday.    Thanks

## 2017-01-24 ENCOUNTER — Ambulatory Visit (INDEPENDENT_AMBULATORY_CARE_PROVIDER_SITE_OTHER): Payer: Self-pay | Admitting: Internal Medicine

## 2017-01-24 DIAGNOSIS — F1120 Opioid dependence, uncomplicated: Secondary | ICD-10-CM

## 2017-01-24 MED ORDER — BUPRENORPHINE HCL-NALOXONE HCL 8-2 MG SL FILM: 1.0000 | ORAL_FILM | Freq: Three times a day (TID) | SUBLINGUAL | 0 refills | Status: DC

## 2017-01-24 NOTE — Progress Notes (Signed)
   01/24/2017  Cody Rios presents for follow up of opioid use disorder I have reviewed the prior induction visit, follow up visits, and telephone encounters relevant to opiate use disorder (OUD) treatment.   Current daily dose: 24mg /day  Date of Induction: 11/11/16  Current follow up interval, in weeks: 2 weeks  The patient has been adherent with the buprenorphine for OUD contract.   Last UDS Result: Appropriate   HPI: Cody Rios presents today for follow up of severe OUD.  He is doing well.  He has had multiple appropriate UDS results.  He reports sobriety from opiates.  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  We discussed any increased stress or pressure from this change in circumstances and he feels that it will actually be easier to avoid temptation when he is away from [place].  Due to his work circumstances, he would prefer if he could follow up on Fridays, which we will arrange.  He reports cravings are well controlled and he is having no side effects from the medication.  I reviewed his UDS with him and his [place] narcotic database is appropriate.    Exam:   There were no vitals filed for this visit.  General: Well dressed, conversant, NAD Eyes: Anicteric sclerae, no conjunctival injection Resp: breathing comfortably, no wheezing Skin: Rash on lower leg much improved, no other skin breakdown or rash Psych: Normal mood, positive attitude today.    Assessment/Plan:  See Problem Based Charting in the Encounters Tab     Inez CatalinaMullen, Eldora Napp B, MD  01/24/2017  10:08 AM

## 2017-01-24 NOTE — Telephone Encounter (Signed)
Pt came to clinic appt today, this is resolved

## 2017-01-24 NOTE — Patient Instructions (Signed)
Eugenio - -  Keep up the good work!    Please come back to see us on 11/2 in the morning.  [facility] and one of us will see you.   THank you.  Call with questions.

## 2017-01-24 NOTE — Addendum Note (Signed)
Addended by: Rocco PaulsATKINSON, HELEN L on: 01/24/2017 11:07 AM   Modules accepted: Orders

## 2017-01-24 NOTE — Assessment & Plan Note (Signed)
Cody HaileyDustin is doing very well on MAT. He reports that his cravings are low and that he is not having issues obtaining or using his buprenorphine.  He has a new job and his girlfriend recently had a baby, which are stressors, but he reports good coping with these at the moment.  He will need to follow up on Fridays, so he will next be seen on 11/2 in the AM.  Either Cody Rios or myself will be available to provide an Rx at that time.   I think he is doing well, could be moved to q4 week visits after we see him next week.    Plan Continue Buprenorphine-naloxone 8-2mg  TID Follow up on 11/2 Consider every 4 week visits if still doing well.

## 2017-01-27 ENCOUNTER — Ambulatory Visit: Payer: Self-pay

## 2017-01-29 LAB — TOXASSURE SELECT,+ANTIDEPR,UR

## 2017-01-31 ENCOUNTER — Ambulatory Visit: Payer: Self-pay | Admitting: Internal Medicine

## 2017-02-03 ENCOUNTER — Ambulatory Visit (INDEPENDENT_AMBULATORY_CARE_PROVIDER_SITE_OTHER): Payer: Self-pay | Admitting: Student in an Organized Health Care Education/Training Program

## 2017-02-03 VITALS — BP 112/58 | HR 75 | Temp 98.0°F | Ht 72.0 in | Wt 168.3 lb

## 2017-02-03 DIAGNOSIS — F1120 Opioid dependence, uncomplicated: Secondary | ICD-10-CM

## 2017-02-03 MED ORDER — BUPRENORPHINE HCL-NALOXONE HCL 8-2 MG SL FILM: 1.0000 | ORAL_FILM | Freq: Three times a day (TID) | SUBLINGUAL | 0 refills | Status: DC

## 2017-02-03 NOTE — Patient Instructions (Signed)
1. Keep up the great work. Come back in 4 weeks for a follow up appointment. Let us know if you have any problems with the medication.

## 2017-02-03 NOTE — Assessment & Plan Note (Signed)
This is a chronic and well controlled problem.  [name] has reached a point of [name] remission over the last 2 months. He has been able to find employment and become a new father with quality time spent with his family. I congratulated him on his outstanding progress. In my opinion, I think he has an excellent chance to continue with sustained remission and I would recommend him for HCV treatment at this point. He has follow up with Dr. Orvan Falconerampbell next week. I am going to lengthen out his follow up visits with us to every 4 weeks now. Plan to continue with Suboxone 8mg  TID, I gave him a four week supply. Urine tox collected today, and should only show [place]. Currently he receives Suboxone for free through pharmaceutical assistance, I encouraged him to consider [employer] open enrollment which started yesterday, he expressed interest in this and will look to see what his premiums and subsidies would be.

## 2017-02-03 NOTE — Progress Notes (Signed)
   02/03/2017  Cody Rios presents for follow up of opioid use disorder. I have reviewed the prior induction visit, follow up visits, and telephone encounters relevant to opiate use disorder (OUD) treatment.   Current daily dose: 24 mg/day  Date of Induction: 11/11/16  Current follow up interval, in weeks: 2  The patient has been adherent with the buprenorphine for OUD contract.   Last UDS Result: appropriate  HPI: Cody Rios is doing very well on medication assisted therapy for severe opioid use disorder.  The last 2 weeks he has not had any relapses, denies any illicit drug use.  Reports that his cravings are completely controlled on current dosing.  Denies any side effects, no constipation.  He takes 1 film 3 times daily and reports good consistency with medication compliance.  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  He tells me his supervisor is aware of his OUD history and current treatment, he is flexible in allowing him to come to office visits on Fridays. So far, he denies being tempted by opioids from others on the crew.  I reviewed the controlled substance prescribing database which shows appropriate dispensing.  Exam:   Vitals:   02/03/17 0925  BP: (!) 112/58  Pulse: 75  Temp: 98 F (36.7 C)  TempSrc: Oral  SpO2: 99%  Weight: 168 lb 4.8 oz (76.3 kg)  Height: 6' (1.829 m)    Gen: well appearing, no distress Neuro: alert, conversational, normal strength and gait Psych: normal appearing mood, well put together today  Assessment/Plan:  See Problem Based Charting in the Encounters Tab   [name], Duncan Thomas, MD  02/03/2017  9:53 AM

## 2017-02-08 ENCOUNTER — Ambulatory Visit (INDEPENDENT_AMBULATORY_CARE_PROVIDER_SITE_OTHER): Payer: Self-pay | Admitting: Internal Medicine

## 2017-02-08 ENCOUNTER — Encounter: Payer: Self-pay | Admitting: Internal Medicine

## 2017-02-08 DIAGNOSIS — R7881 Bacteremia: Secondary | ICD-10-CM

## 2017-02-08 DIAGNOSIS — B182 Chronic viral hepatitis C: Secondary | ICD-10-CM

## 2017-02-08 DIAGNOSIS — F1120 Opioid dependence, uncomplicated: Secondary | ICD-10-CM

## 2017-02-08 MED ORDER — SOFOSBUVIR-VELPATASVIR 400-100 MG PO TABS: 1.0000 | ORAL_TABLET | Freq: Every day | ORAL | 2 refills | Status: DC

## 2017-02-08 NOTE — Assessment & Plan Note (Signed)
He has chronic hepatitis C with genotype Ia. He has some mild liver enzyme elevation. His INR is normal. His fibrosis score was F0-F1. No liver masses were noted on the upper abdominal images from his chest CT scan obtained when he was in the hospital recently. Now that he is off of injecting drugs he is a candidate for treatment. We will see if we can get him started on Epclusa.

## 2017-02-08 NOTE — Assessment & Plan Note (Signed)
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

## 2017-02-08 NOTE — Progress Notes (Signed)
[facility]  Patient Active Problem List   Diagnosis Date Noted  . Opioid use disorder, severe, on maintenance therapy (HCC) 11/22/2016  . Hepatitis C infection 11/22/2016  . Severe tricuspid regurgitation 11/10/2016  . MSSA bacteremia 11/10/2016      Medication List        Accurate as of 02/08/17  4:52 PM. Always use your most recent med list.          Buprenorphine HCl-Naloxone HCl 8-2 MG Film Place 1 Film under the tongue 3 (three) times daily.   Sofosbuvir-Velpatasvir 400-100 MG Tabs Commonly known as:  EPCLUSA Take 1 tablet daily by mouth.       Where to Get Your Medications    You can get these medications from any pharmacy   Bring a paper prescription for each of these medications  Sofosbuvir-Velpatasvir 400-100 MG Tabs     Subjective: Cody Rios is in for his routine follow-up visit. 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 He continues to do well. He has not had any fever, chills or sweats. He has not had any cough or shortness of breath. He has had no joint pain or swelling.  He has had no drug use since before he went into the hospital. He is following up with Dr. Oswaldo Rios and reins on Suboxone through a patient assistance program. He had been very reluctant to go on Suboxone therapy. However he finds that it has been tremendously helpful in reducing his cravings. He is currently not in any drug counseling programs. 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  He is very eager to start therapy for hepatitis C. He has never been on treatment before.  Review of Systems: Review of Systems  Constitutional: Negative for chills, diaphoresis, fever, malaise/fatigue and weight loss.  HENT: Negative for sore  throat.   Respiratory: Negative for cough, sputum production and shortness of breath.   Cardiovascular: Negative for chest pain.  Gastrointestinal: Negative for abdominal pain, diarrhea, heartburn, nausea and vomiting.  Genitourinary: Negative for dysuria and frequency.  Musculoskeletal: Negative for joint pain and myalgias.  Skin: Negative for rash.  Neurological: Negative for dizziness and headaches.  Psychiatric/Behavioral: Negative for depression and substance abuse. The patient is not nervous/anxious.     Past Medical History:  Diagnosis Date  . 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 Hepatitis C   . MSSA bacteremia 11/10/2016  . Opioid use disorder, severe, on maintenance therapy (HCC) 11/22/2016    Social History   Tobacco Use  . Smoking status: Never Smoker  . Smokeless tobacco: Current User    Types: Chew  Substance Use Topics  . Alcohol use: No  . Drug use: No    Comment: Last used: 11/09/16    Family History  Problem Relation Age of Onset  . Hypertension Father     Allergies  Allergen Reactions  . Cephalosporins Hives  . Penicillins Hives, Shortness Of Breath and Rash    Objective: Vitals:   02/08/17 1600  BP: 118/77  Pulse: 62  Temp: 97.6 F (36.4 C)  TempSrc: Oral  Weight: 169 lb (76.7 kg)  Height: 6\' 2"  (1.88 m)   Body mass index is 21.7 kg/m.  Physical Exam  Constitutional:  He is oriented to person, place, and time.  He is smiling and in good spirits.His weight is up 10 pounds since he left the hospital.  HENT:  Mouth/Throat: No oropharyngeal exudate.  Eyes: Conjunctivae are normal.  Cardiovascular: Normal rate and regular rhythm.  No murmur heard. Pulmonary/Chest: Effort normal and breath sounds normal. He has no wheezes. He has no rales.  Abdominal: Soft. He exhibits no mass. There is no tenderness.  Musculoskeletal: Normal range of motion. He exhibits no edema or tenderness.  Neurological: He is alert and oriented to person, place,  and time.  Skin: No rash noted.  Psychiatric: Mood and affect normal.    Lab Results    Problem List Items Addressed This Visit      Unprioritized   Hepatitis C infection (Chronic)    He has chronic hepatitis C with genotype Ia. He has some mild liver enzyme elevation. His INR is normal. His fibrosis score was F0-F1. No liver masses were noted on the upper abdominal images from his chest CT scan obtained when he was in the hospital recently. Now that he is off of injecting drugs he is a candidate for treatment. We will see if we can get him started on Epclusa.      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      Relevant Medications   Sofosbuvir-Velpatasvir (EPCLUSA) 400-100 MG TABS   Opioid use disorder, severe, on maintenance therapy (HCC) (Chronic)    It appears that he has unable to manage his addiction with Suboxone therapy. I encouraged him to continue his regular visits and follow-up with Dr. Oswaldo Rios.          Cody AstersJohn Mayan Dolney, MD [facility] [phone] pager   [phone] cell 02/08/2017, 4:52 PM

## 2017-02-08 NOTE — Assessment & Plan Note (Signed)
It appears that he has unable to manage his addiction with Suboxone therapy. I encouraged him to continue his regular visits and follow-up with Dr. Oswaldo DoneVincent.

## 2017-02-11 LAB — TOXASSURE SELECT,+ANTIDEPR,UR

## 2017-02-14 ENCOUNTER — Telehealth: Payer: Self-pay | Admitting: Pharmacy Technician

## 2017-02-14 NOTE — Telephone Encounter (Signed)
Cody Rios states he will either mail or drop off 2 paystubs to me within the week.  These are needed to get Hep C medication approval through [facility]

## 2017-03-03 ENCOUNTER — Other Ambulatory Visit: Payer: Self-pay

## 2017-03-03 ENCOUNTER — Ambulatory Visit (INDEPENDENT_AMBULATORY_CARE_PROVIDER_SITE_OTHER): Payer: Self-pay | Admitting: Internal Medicine

## 2017-03-03 ENCOUNTER — Encounter: Payer: Self-pay | Admitting: Internal Medicine

## 2017-03-03 VITALS — BP 116/79 | HR 74 | Temp 97.5°F | Ht 74.0 in | Wt 167.7 lb

## 2017-03-03 DIAGNOSIS — F1120 Opioid dependence, uncomplicated: Secondary | ICD-10-CM

## 2017-03-03 DIAGNOSIS — B1920 Unspecified viral hepatitis C without hepatic coma: Secondary | ICD-10-CM

## 2017-03-03 MED ORDER — BUPRENORPHINE HCL-NALOXONE HCL 8-2 MG SL FILM: 1.0000 | ORAL_FILM | Freq: Three times a day (TID) | SUBLINGUAL | 0 refills | Status: DC

## 2017-03-03 NOTE — Assessment & Plan Note (Signed)
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 Has new 702 month old baby, born in "~September~" Denied any other medications Denied taking any other substances Is aware that he failed the last drug test as he was on amphetamines, stated that he went out of town and was working a long shift and used it to assist with that. Denied recent or current use.   Plan: Continue the 8mg  TID and return in one month Call with any questions

## 2017-03-03 NOTE — Progress Notes (Signed)
   CC: OUD  HPI:  Mr.Whitt A Rundle is a 26 y.o. male who presents today for evaluation of he OUD. He denied acute complaints including nausea, vomiting, diarrhea, constipation, palpitations, chest pain, SOB on exertion, muscle aches or pain, dysuria, or headache. Is to follow up with specialist for Hep C treatment.   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 Has new 192 month old baby, born in "~September~" Denied any other medications Denied taking any other substances Is aware that he failed the last drug test as he was on amphetamines, stated that he went out of town and was working a long shift and used it to assist with that. Denied recent or current use.   Plan: Continue the 8mg  TID and return in one month Call with any questions    Past Medical History:  Diagnosis Date  . 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 Hepatitis C   . MSSA bacteremia 11/10/2016  . Opioid use disorder, severe, on maintenance therapy (HCC) 11/22/2016   Review of Systems:  ROS negative except as per HPI.  Physical Exam:  Vitals:   03/03/17 0939  BP: 116/79  Pulse: 74  Temp: (!) 97.5 F (36.4 C)  TempSrc: Oral  SpO2: 98%  Weight: 167 lb 11.2 oz (76.1 kg)  Height: 6\' 2"  (1.88 m)   Physical Exam  Constitutional: He appears well-developed. No distress.  Eyes: Pupils are equal, round, and reactive to light.  Cardiovascular: Normal rate and regular rhythm.  Murmur heard.  Systolic murmur is present. Pulmonary/Chest: Effort normal and breath sounds normal. No respiratory distress.  Abdominal: Soft. Bowel sounds are normal. He exhibits no distension.  Musculoskeletal: Normal range of motion. He exhibits no edema or tenderness.  Skin: Skin is warm.  Vitals reviewed.   Assessment & Plan:   See Encounters Tab for problem based charting.  Patient seen with Dr. Criselda PeachesMullen

## 2017-03-03 NOTE — Patient Instructions (Signed)
FOLLOW-UP INSTRUCTIONS When: One month For: Routine visit  What to bring: Questions   [facility].  Please continue the good work you have been doing. If at any time you have any questions or concerns please feel free to call our clinic. We have an after hours on call MD who can at the very least return a message that you leave for them and give you a return call.

## 2017-03-05 NOTE — Progress Notes (Signed)
Internal Medicine Clinic Attending  I saw and evaluated the patient.  I personally confirmed the key portions of the history and exam documented by Dr. Crista ElliotHarbrecht and I reviewed pertinent patient test results.  The assessment, diagnosis, and plan were formulated together and I agree with the documentation in the resident's note.  Cody Rios is a young man with moderate OUD who has been in recovery for a few months with us. He is doing well today.  He had a UDS with + amphetamine at last check and he reports taking a pill to help him stay awake at work.  Otherwise, denies substances.  He is under treatment with ID for HCV and is hoping to get assistance getting the medications.  He has no complaints today.  Cravings are well controlled with current dose of buprenorphine which was refilled.  UDS today.

## 2017-03-08 LAB — TOXASSURE SELECT,+ANTIDEPR,UR

## 2017-03-21 ENCOUNTER — Ambulatory Visit (INDEPENDENT_AMBULATORY_CARE_PROVIDER_SITE_OTHER): Payer: Self-pay | Admitting: Student in an Organized Health Care Education/Training Program

## 2017-03-21 ENCOUNTER — Telehealth: Payer: Self-pay | Admitting: *Deleted

## 2017-03-21 VITALS — BP 123/68 | HR 63 | Temp 98.0°F | Resp 20 | Wt 172.1 lb

## 2017-03-21 DIAGNOSIS — I071 Rheumatic tricuspid insufficiency: Secondary | ICD-10-CM

## 2017-03-21 DIAGNOSIS — Z8614 Personal history of Methicillin resistant Staphylococcus aureus infection: Secondary | ICD-10-CM

## 2017-03-21 DIAGNOSIS — F1120 Opioid dependence, uncomplicated: Secondary | ICD-10-CM

## 2017-03-21 DIAGNOSIS — B1920 Unspecified viral hepatitis C without hepatic coma: Secondary | ICD-10-CM

## 2017-03-21 DIAGNOSIS — B182 Chronic viral hepatitis C: Secondary | ICD-10-CM

## 2017-03-21 MED ORDER — BUPRENORPHINE HCL-NALOXONE HCL 8-2 MG SL FILM: 1.0000 | ORAL_FILM | Freq: Three times a day (TID) | SUBLINGUAL | 0 refills | Status: DC

## 2017-03-21 NOTE — Assessment & Plan Note (Signed)
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 He was fully treated with antibiotics. He has excellent exertional capacity, no signs of right heart failure on exam. I don't even detect a systolic murmur anymore. I would like to repeat the echo in about 6 months to monitor for valve changes, hopefully he will have insurance by then.

## 2017-03-21 NOTE — Assessment & Plan Note (Signed)
Severe OUD is very well controlled, currently in remission for 4 months. This is the longest amount of time he has been in remission in 13 years. I congratulated him. Plan is to continue with Suboxone 8mg  film TID, I gave him a one month supply. I checked the database that was appropriate. U tox ordered today to confirm compliance. He is doing very very well.

## 2017-03-21 NOTE — Telephone Encounter (Signed)
Pt called and stated pharm would not fill his med, called [facility], last fill was 11/30 for 30days, called pt back informed him he must wait 30 days, he is agreeable, will start using [facility] script

## 2017-03-21 NOTE — Patient Instructions (Signed)
1. Keep up the great work. Continue your medication as prescribed.   2. Your heart sounds great, I don't hear a murmur anymore. We should repeat the heart ultrasound sometime next year to double check the valve has healed.  3. Start the hepatitis C treatment as soon as you can.

## 2017-03-21 NOTE — Progress Notes (Signed)
   03/21/2017  Cody Rios presents for follow up of opioid use disorder I have reviewed the prior induction visit, follow up visits, and telephone encounters relevant to opiate use disorder (OUD) treatment.   Current daily dose: Suboxone 24mg  daily  Date of Induction: 11/19/2016  Current follow up interval, in weeks: 4  The patient has been adherent with the buprenorphine for OUD contract.   Last UDS Result: Appropriate  HPI: 26 year old man with OUD doing well on Bupe-based MAT. He has been injecting heroin since age 26. He had MRSA endocarditis in August, which is when we induced him on Bupe as an inpatient. He recovered very well from the infection and is stable in remission on treatment with Bupe. 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 He has a new born at home. He says he is staying busy, has a lot of joy in his life with a good job and good family support. No insurance yet. He reports cravings are well controlled on three films daily. No constipation, no fevers, no relapses since last seen.   Exam:   Vitals:   03/21/17 0934  BP: 123/68  Pulse: 63  Resp: 20  Temp: 98 F (36.7 C)  TempSrc: Oral  SpO2: 99%  Weight: 172 lb 1.6 oz (78.1 kg)    Gen: well appearing Neuro: alert, oriented, conversation, full strength, normal gait CV: RRR, no murmurs today Ext: warm, no rash or SSI. Psych: well groomed, appropriate affect   Assessment/Plan:  See Problem Based Charting in the Encounters Tab     Cody Rios, Cody Gritz Thomas, MD  03/21/2017  10:28 AM

## 2017-03-21 NOTE — Assessment & Plan Note (Signed)
HCV infection from prior injection drug use. He has [facility] up [facility] who has prescribed Epclusa. He heard that he was approved for assistance program, and the manufacturer is mailing him the medicine today. He will take it daily for 12 weeks. No follow up with [facility] set right now, probably should follow up after he completes the treatment course.

## 2017-03-25 LAB — TOXASSURE SELECT,+ANTIDEPR,UR

## 2017-04-14 ENCOUNTER — Encounter: Payer: Self-pay | Admitting: Pharmacy Technician

## 2017-04-25 ENCOUNTER — Ambulatory Visit (INDEPENDENT_AMBULATORY_CARE_PROVIDER_SITE_OTHER): Payer: Self-pay | Admitting: Internal Medicine

## 2017-04-25 VITALS — BP 116/78 | HR 72 | Temp 97.4°F | Resp 20 | Wt 173.7 lb

## 2017-04-25 DIAGNOSIS — F1120 Opioid dependence, uncomplicated: Secondary | ICD-10-CM

## 2017-04-25 DIAGNOSIS — R0981 Nasal congestion: Secondary | ICD-10-CM

## 2017-04-25 DIAGNOSIS — B182 Chronic viral hepatitis C: Secondary | ICD-10-CM

## 2017-04-25 DIAGNOSIS — I071 Rheumatic tricuspid insufficiency: Secondary | ICD-10-CM

## 2017-04-25 DIAGNOSIS — Z8614 Personal history of Methicillin resistant Staphylococcus aureus infection: Secondary | ICD-10-CM

## 2017-04-25 DIAGNOSIS — B1920 Unspecified viral hepatitis C without hepatic coma: Secondary | ICD-10-CM

## 2017-04-25 DIAGNOSIS — R011 Cardiac murmur, unspecified: Secondary | ICD-10-CM

## 2017-04-25 MED ORDER — BUPRENORPHINE HCL-NALOXONE HCL 8-2 MG SL FILM: 1.0000 | ORAL_FILM | Freq: Three times a day (TID) | SUBLINGUAL | 0 refills | Status: DC

## 2017-04-25 NOTE — Assessment & Plan Note (Signed)
He denies any exertional dyspnea, chest pain or lower extremity edema.  He has good exercise tolerance and enjoy the things what is supposed to do on a daily routine.  He will need a repeat echo once he have insurance to reassess his tricuspid regurgitation.  He still has a low intensity systolic murmur.

## 2017-04-25 NOTE — Assessment & Plan Note (Signed)
His symptoms are more consistent with viral nasal congestion.  Currently no symptoms suggestive of flu.  He never had any flu vaccine.  He was advised to use saline nasal spray and over-the-counter decongestant.  He was also advised to return to the clinic if his symptoms get worse or he develop fever and generalized malaise for flu testing.

## 2017-04-25 NOTE — Patient Instructions (Signed)
Thank you for visiting clinic today. I am glad that you are doing well with Suboxone. For your nasal congestion you can use saline nasal spray or Nettie pods. You can also use over-the-counter nasal decongestant to help with your symptoms, if you symptoms get worse or you start developing fever and generalized body aches please visit the clinic to check for flu.  If your symptoms persist more than 10 days please let us know you might need antibiotics at that point. A new prescription of Suboxone has been sent to your pharmacy. Please follow-up in 4 weeks.

## 2017-04-25 NOTE — Assessment & Plan Note (Signed)
He started taking Epclusa.  A follow-up with Dr. Orvan Falconerampbell.  Denies any complaints or side effects.  Complete the full course of Epclusa.  For 12 weeks.

## 2017-04-25 NOTE — Assessment & Plan Note (Signed)
He has severe OUD, currently in remission for the past 3062-month.  He denies any craving.  He is adherent with Suboxone and denies any side effects.  He appears happy with his job and family. His UDS was appropriate, and see database was checked and it was appropriate too.  -Continue Suboxone-new prescription was provided for 4 weeks.

## 2017-04-25 NOTE — Progress Notes (Signed)
   04/25/2017  Kassim A Fissel presents for follow up of opioid use disorder I have reviewed the prior induction visit, follow up visits, and telephone encounters relevant to opiate use disorder (OUD) treatment.   Current daily dose: Suboxone 24mg  daily  Date of Induction: 11/19/2016  Current follow up interval, in weeks: 4  The patient has been adherent with the buprenorphine for OUD contract.   Last UDS Result: Appropriate.  HPI: 27 year old gentleman with history of opioid use disorder, hepatitis C and recent MRSA endocarditis in August.  He was injecting heroin for 13 years, he is sober for the past 5534-month and very proud of that. He denies any Suboxone side effects and adherent to its use.  He denies any craving and staying away from friends who was using illicit drugs.  He is enjoying his new life with the job and family.  He was experiencing nasal congestion since yesterday, initially he had mild sore throat and then developed nasal congestion and clear rhinorrhea.  He denies any fever, chills generalized malaise or sore throat currently.  He denies any facial pain.  Exam:   Vitals:   04/25/17 0914  BP: 116/78  Pulse: 72  Resp: 20  Temp: (!) 97.4 F (36.3 C)  TempSrc: Oral  SpO2: 97%  Weight: 173 lb 11.2 oz (78.8 kg)    Gen. well-developed gentleman, in no acute distress, cooperative with exam. HEENT.  Mild erythema with clear discharge of nasal turbinate.  No pharyngeal exudate or erythema. CVS.RRR with systolic murmur. Lungs.  Clear bilaterally.  Assessment/Plan:  See Problem Based Charting in the Encounters Tab     Arnetha CourserAmin, Treyvin Glidden, MD  04/25/2017  10:37 AM

## 2017-04-25 NOTE — Addendum Note (Signed)
Addended by: Rocco PaulsATKINSON, Joclyn Alsobrook L on: 04/25/2017 11:01 AM   Modules accepted: Orders

## 2017-04-26 ENCOUNTER — Ambulatory Visit (INDEPENDENT_AMBULATORY_CARE_PROVIDER_SITE_OTHER): Payer: Self-pay | Admitting: Pharmacist

## 2017-04-26 DIAGNOSIS — B182 Chronic viral hepatitis C: Secondary | ICD-10-CM

## 2017-04-26 NOTE — Progress Notes (Signed)
[facility] Pharmacy Visit  HPI: Cody Rios is a 27 y.o. male who [facility] for Hep C follow-up.  He has genotype 1a, F0/F1, and started 12 weeks of Epclusa through [facility] ~12/19.  Patient Active Problem List   Diagnosis Date Noted  . Nasal congestion 04/25/2017  . Opioid use disorder, severe, on maintenance therapy (HCC) 11/22/2016  . Hepatitis C infection 11/22/2016  . Severe tricuspid regurgitation 11/10/2016    Patient's Medications  New Prescriptions   No medications on file  Previous Medications   BUPRENORPHINE HCL-NALOXONE HCL 8-2 MG FILM    Place 1 Film under the tongue 3 (three) times daily.   SOFOSBUVIR-VELPATASVIR (EPCLUSA) 400-100 MG TABS    Take 1 tablet daily by mouth.  Modified Medications   No medications on file  Discontinued Medications   No medications on file    Allergies: Allergies  Allergen Reactions  . Cephalosporins Hives  . Penicillins Hives, Shortness Of Breath and Rash    Past Medical History: Past Medical History:  Diagnosis Date  . 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 Hepatitis C   . MSSA bacteremia 11/10/2016  . Opioid use disorder, severe, on maintenance therapy (HCC) 11/22/2016    Social History: Social History   Socioeconomic History  . Marital status: Single    Spouse name: Not on file  . Number of children: Not on file  . Years of education: Not on file  . Highest education level: Not on file  Social Needs  . Financial resource strain: Not on file  . Food insecurity - worry: Not on file  . Food insecurity - inability: Not on file  . Transportation needs - medical: Not on file  . Transportation needs - non-medical: Not on file  Occupational History  . Not on file  Tobacco Use  . Smoking status: Never Smoker  . Smokeless tobacco: Current User    Types: Chew  Substance and Sexual Activity  . Alcohol use: No  . Drug use: No    Comment: Last used:  11/09/16  . Sexual activity: Not on file  Other Topics Concern  . Not on file  Social History Narrative  . Not on file    Labs: Hepatitis B Surface Ag (no units)  Date Value  11/13/2016 Negative    Lab Results  Component Value Date   HEPCGENOTYPE 1a 11/13/2016    No flowsheet data found.  AST (U/L)  Date Value  12/20/2016 64 (H)  11/12/2016 35  11/11/2016 32   ALT (U/L)  Date Value  12/20/2016 83 (H)  12/20/2016 87 (H)  11/12/2016 43  11/11/2016 45   INR (no units)  Date Value  12/20/2016 1.1  11/10/2016 1.18  11/09/2016 1.27    Fibrosis Score: F0/F1 as assessed by [facility]   Assessment: Cody Rios is here today to follow-up for his Hep C infection.  He is doing very well on his [place]clusa. He has started on his 2nd month and is having no issues tolerating it at all.  He takes it every morning around 730-8am.  He has not missed any doses.  He does get a little nauseous before he eats around 9am.  I told him he could move his dosing up a little to take it with food to see if it resolves. He is uninsured and he has not applied for [facility] financial assistance or [facility] lab assistance yet.  He had a low baseline hep c  viral load of 467,000, so I will give him the applications today to fill out and wait until he is done with therapy to get labs. He is doing well and has no issues today.   Plan: - Continue Epclusa x 12 weeks - F/u with me again 3/27 at 9am  Jorita Bohanon L. Deyanira Fesler, PharmD, AAHIVP, CPP Infectious Diseases Clinical Pharmacist [facility] 04/26/2017, 12:21 PM

## 2017-04-26 NOTE — Progress Notes (Signed)
Internal Medicine Clinic Attending  I saw and evaluated the patient.  I personally confirmed the key portions of the history and exam documented by Dr. Amin and I reviewed pertinent patient test results.  The assessment, diagnosis, and plan were formulated together and I agree with the documentation in the resident's note. 

## 2017-05-02 LAB — TOXASSURE SELECT,+ANTIDEPR,UR

## 2017-05-23 ENCOUNTER — Ambulatory Visit (INDEPENDENT_AMBULATORY_CARE_PROVIDER_SITE_OTHER): Payer: Self-pay | Admitting: Internal Medicine

## 2017-05-23 VITALS — BP 130/83 | HR 67 | Temp 98.3°F | Ht 74.0 in | Wt 176.1 lb

## 2017-05-23 DIAGNOSIS — B182 Chronic viral hepatitis C: Secondary | ICD-10-CM

## 2017-05-23 DIAGNOSIS — F1120 Opioid dependence, uncomplicated: Secondary | ICD-10-CM

## 2017-05-23 MED ORDER — BUPRENORPHINE HCL-NALOXONE HCL 8-2 MG SL FILM: 1.0000 | ORAL_FILM | Freq: Three times a day (TID) | SUBLINGUAL | 0 refills | Status: DC

## 2017-05-23 NOTE — Assessment & Plan Note (Addendum)
Doing well on treatment.  He has no craving and is holding down a job currently.  Had his daughter with him today  PLan Continue buprenorphine-naloxone 8mg -2mg  TID UDS today Follow up in 1 month Reminded to call if he has any issues/problems.   [place] narcotic database reviewed and appropriate.

## 2017-05-23 NOTE — Assessment & Plan Note (Signed)
He is following with [facility] and doing well on current therapy.  He has an appointment with [facility] in March.   Plan Continue Dorita FrayEpclusa

## 2017-05-23 NOTE — Progress Notes (Addendum)
   05/23/2017  Cody Rios presents for follow up of opioid use disorder I have reviewed the prior induction visit, follow up visits, and telephone encounters relevant to opiate use disorder (OUD) treatment.   Current daily dose: 24mg  daily  Date of Induction: 11/19/16  Current follow up interval, in weeks: 4  The patient has been adherent with the buprenorphine for OUD contract.   Last UDS Result: appropriate with low titer of THC  HPI: Cody Rios is a [age]yo man with history of moderate OUD who has been in treatment successfully for 5 months.  He is doing well today.  Reports no narcotic use and good craving control with current dose of suboxone.  He knows that he will need to wean off the medication in the future and we began discussing how that would work.  He is amenable to starting this plan in the next 3-4 months.  He is working steadily and living with his girlfriend and daughter.  He reports that he is occasionally using marijuana, but in a safe environment.    ROS: Doing well, no complaints, ROS negative for chest pain, SOB.  Exam:   Vitals:   05/23/17 0922  BP: 130/83  Pulse: 67  Temp: 98.3 F (36.8 C)  TempSrc: Oral  SpO2: 100%  Weight: 176 lb 1.6 oz (79.9 kg)  Height: 6\' 2"  (1.88 m)    Physical Exam:  Gen: Alert, awake, doing well Eyes: Anicteric sclerae Pulm: Breathing comfortably without any audible wheezing Psych: Pleasant, daughter in room with him.    Assessment/Plan:  See Problem Based Charting in the Encounters Tab     Cody Rios, Cody Bowring B, MD  05/23/2017  9:34 AM

## 2017-05-23 NOTE — Patient Instructions (Signed)
Cody Rios - -  It was great to see you today.  You are doing very well!  Come back in 1 month.

## 2017-05-26 ENCOUNTER — Telehealth: Payer: Self-pay | Admitting: Internal Medicine

## 2017-05-26 NOTE — Telephone Encounter (Signed)
   Reason for call:   I received a call from Mr. Crit A Royall at 7 PM indicating that he went to get his prescription of Suboxone and pharmacy never received the prescription which was sent on May 23, 2017.  He ran out of his medicine and cannot wait till Monday.   Pertinent Data:   Patient is on Suboxone since August 2018 for his opioid use disorder, recently seen in clinic on May 23, 2017 and provided with 4 weeks worth of Suboxone.  [facility], called the pharmacy and verified that prescription was there.   Assessment / Plan / Recommendations:   Called back the patient and let him know that he can [facility] and [facility].  As always, pt is advised that if symptoms worsen or new symptoms arise, they should go to an urgent care facility or to to ER for further evaluation.   Arnetha CourserAmin, Murrel Bertram, MD   05/26/2017, 7:09 PM

## 2017-05-29 LAB — TOXASSURE SELECT,+ANTIDEPR,UR

## 2017-06-20 ENCOUNTER — Ambulatory Visit (INDEPENDENT_AMBULATORY_CARE_PROVIDER_SITE_OTHER): Payer: Self-pay | Admitting: Student in an Organized Health Care Education/Training Program

## 2017-06-20 VITALS — BP 129/85 | HR 61 | Temp 97.6°F | Ht 74.0 in | Wt 176.5 lb

## 2017-06-20 DIAGNOSIS — Z8679 Personal history of other diseases of the circulatory system: Secondary | ICD-10-CM

## 2017-06-20 DIAGNOSIS — Z8619 Personal history of other infectious and parasitic diseases: Secondary | ICD-10-CM

## 2017-06-20 DIAGNOSIS — F1120 Opioid dependence, uncomplicated: Secondary | ICD-10-CM

## 2017-06-20 MED ORDER — BUPRENORPHINE HCL-NALOXONE HCL 8-2 MG SL FILM: 1.0000 | ORAL_FILM | Freq: Three times a day (TID) | SUBLINGUAL | 0 refills | Status: DC

## 2017-06-20 NOTE — Patient Instructions (Signed)
Keep up the great work, see you back in one month

## 2017-06-20 NOTE — Progress Notes (Signed)
   06/20/2017  Cody Rios presents for follow up of opioid use disorder I have reviewed the prior induction visit, follow up visits, and telephone encounters relevant to opiate use disorder (OUD) treatment.   Current daily dose: Suboxone 24 mg daily  Date of Induction: 11/09/2016  Current follow up interval, in weeks: 4  The patient has been adherent with the buprenorphine for OUD contract.   Last UDS Result: Appropriate  HPI: 27 year old man with a severe opioid use disorder here for follow-up of office-based opioid treatment.  He is doing very well, he is been in a stable remission for over 6 months.  He reports the Suboxone is managing his cravings and keeping him out of withdrawal with good consistency.  He is having no adverse side effects, denies constipation or dental caries.  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  He has a 4837-month-old daughter at home who is doing really well.  Denies any fevers or chills.  Denies any relapses over the last month.  He has finished 12 weeks of antiviral for hepatitis C infection and has a follow-up with the infectious disease clinic next week.  Exam:   Vitals:   06/20/17 0856  BP: 129/85  Pulse: 61  Temp: 97.6 F (36.4 C)  TempSrc: Oral  SpO2: 100%  Weight: 176 lb 8 oz (80.1 kg)  Height: 6\' 2"  (1.88 m)    General: Well-appearing young man in no distress ENT: Normal dentition Neck: No masses, no thyromegaly, no JVD Heart: Regular rate and rhythm, no murmurs Extremity: Warm and well perfused with no edema.  Assessment/Plan:  See Problem Based Charting in the Encounters Tab    Cody Rios Cody Rios, Cody Rios Thomas, MD  06/20/2017  9:11 AM

## 2017-06-20 NOTE — Assessment & Plan Note (Signed)
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  He has been in a very nice and stable remission with no relapses in over 6 months.  Plan to continue with Suboxone 8 mg films 3 times daily.  U tox has been appropriate, I checked the controlled substance reporting system which was also appropriate today.  Follow-up in 1 month.

## 2017-06-28 ENCOUNTER — Ambulatory Visit (INDEPENDENT_AMBULATORY_CARE_PROVIDER_SITE_OTHER): Payer: Self-pay | Admitting: Pharmacist

## 2017-06-28 DIAGNOSIS — B182 Chronic viral hepatitis C: Secondary | ICD-10-CM

## 2017-06-28 LAB — COMPREHENSIVE METABOLIC PANEL
AG RATIO: 1.6 (calc) (ref 1.0–2.5)
ALBUMIN MSPROF: 5.2 g/dL — AB (ref 3.6–5.1)
ALT: 15 U/L (ref 9–46)
AST: 31 U/L (ref 10–40)
Alkaline phosphatase (APISO): 74 U/L (ref 40–115)
BUN: 15 mg/dL (ref 7–25)
CHLORIDE: 100 mmol/L (ref 98–110)
CO2: 27 mmol/L (ref 20–32)
CREATININE: 0.89 mg/dL (ref 0.60–1.35)
Calcium: 10.1 mg/dL (ref 8.6–10.3)
GLOBULIN: 3.2 g/dL (ref 1.9–3.7)
GLUCOSE: 86 mg/dL (ref 65–99)
Potassium: 3.6 mmol/L (ref 3.5–5.3)
SODIUM: 137 mmol/L (ref 135–146)
TOTAL PROTEIN: 8.4 g/dL — AB (ref 6.1–8.1)
Total Bilirubin: 1.1 mg/dL (ref 0.2–1.2)

## 2017-06-28 NOTE — Progress Notes (Signed)
[facility] Pharmacy Visit  HPI: Cody Rios is a 27 y.o. male who presents to the [facility] pharmacy clinic for EOT Hep C follow-up.  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  Patient Active Problem List   Diagnosis Date Noted  . Opioid use disorder, severe, on maintenance therapy (HCC) 11/22/2016  . Hepatitis C infection 11/22/2016  . Severe tricuspid regurgitation 11/10/2016    Patient's Medications  New Prescriptions   No medications on file  Previous Medications   BUPRENORPHINE HCL-NALOXONE HCL 8-2 MG FILM    Place 1 Film under the tongue 3 (three) times daily.   SOFOSBUVIR-VELPATASVIR (EPCLUSA) 400-100 MG TABS    Take 1 tablet daily by mouth.  Modified Medications   No medications on file  Discontinued Medications   No medications on file    Allergies: Allergies  Allergen Reactions  . Cephalosporins Hives  . Penicillins Hives, Shortness Of Breath and Rash    Past Medical History: Past Medical History:  Diagnosis Date  . 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 Hepatitis C   . MSSA bacteremia 11/10/2016  . Opioid use disorder, severe, on maintenance therapy (HCC) 11/22/2016    Social History: Social History   Socioeconomic History  . Marital status: Single    Spouse name: Not on file  . Number of children: Not on file  . Years of education: Not on file  . Highest education level: Not on file  Occupational History  . Not on file  Social Needs  . Financial resource strain: Not on file  . Food insecurity:    Worry: Not on file    Inability: Not on file  . Transportation needs:    Medical: Not on file    Non-medical: Not on file  Tobacco Use  . Smoking status: Never Smoker  . Smokeless tobacco: Current User    Types: Chew  Substance and Sexual Activity  . Alcohol use: No  . Drug use: No    Types: IV, Heroin    Comment: Last used: 11/09/16  . Sexual activity: Not on file  Lifestyle  .  Physical activity:    Days per week: Not on file    Minutes per session: Not on file  . Stress: Not on file  Relationships  . Social connections:    Talks on phone: Not on file    Gets together: Not on file    Attends religious service: Not on file    Active member of club or organization: Not on file    Attends meetings of clubs or organizations: Not on file    Relationship status: Not on file  Other Topics Concern  . Not on file  Social History Narrative  . Not on file    Labs: Hepatitis B Surface Ag (no units)  Date Value  11/13/2016 Negative    Lab Results  Component Value Date   HEPCGENOTYPE 1a 11/13/2016    No flowsheet data found.  AST (U/L)  Date Value  12/20/2016 64 (H)  11/12/2016 35  11/11/2016 32   ALT (U/L)  Date Value  12/20/2016 83 (H)  12/20/2016 87 (H)  11/12/2016 43  11/11/2016 45   INR (no units)  Date Value  12/20/2016 1.1  11/10/2016 1.18  11/09/2016 1.27    Fibrosis Score: F0/F1 as assessed by fibrosure   Assessment: Cody Rios is here today to follow-up for his Hep C infection.  He completed 12  weeks of Epclusa around 2 weeks ago.  He had some nausea the first couple of weeks but it resolved with time.  No fatigue or headache.  No missed doses and took it at 730-8am every morning.  I didn't get labs last time as he is uninsured and did not have [facility] financial assistance.  I gave him an application and a [facility] application in January but he did not do them.  I will have him fill out [facility] right now and give it to Clydie Braun in the lab to turn in for him.  I gave him another [facility] financial application and told him that if he wanted help with his bills, he needed to fill these out.  I will get a Hep C viral load today.  He would like me to call him with the results. I will have him come back in 3 months for SVR12.   Plan: - Completed 12 weeks of Epclusa - Hep C viral load today - F/u with me again 6/24 at 9am for SVR12  Chaeli Judy L. Lamorris Knoblock,  PharmD, AAHIVP, CPP Infectious Diseases Clinical Pharmacist [facility] 06/28/2017, 10:30 AM

## 2017-06-30 ENCOUNTER — Telehealth: Payer: Self-pay | Admitting: Pharmacist

## 2017-06-30 LAB — HEPATITIS C RNA QUANTITATIVE
HCV QUANT LOG: NOT DETECTED {Log_IU}/mL
HCV RNA, PCR, QN: NOT DETECTED [IU]/mL

## 2017-06-30 NOTE — Telephone Encounter (Signed)
Called Cody Rios to let him know that his Hep C viral load was undetectable. Will check again in 3 months for SVR12.

## 2017-07-18 ENCOUNTER — Ambulatory Visit (INDEPENDENT_AMBULATORY_CARE_PROVIDER_SITE_OTHER): Payer: Self-pay | Admitting: Student in an Organized Health Care Education/Training Program

## 2017-07-18 VITALS — BP 124/84 | HR 64 | Temp 97.7°F | Resp 18 | Wt 172.1 lb

## 2017-07-18 DIAGNOSIS — Z8619 Personal history of other infectious and parasitic diseases: Secondary | ICD-10-CM

## 2017-07-18 DIAGNOSIS — F1120 Opioid dependence, uncomplicated: Secondary | ICD-10-CM

## 2017-07-18 DIAGNOSIS — Z8679 Personal history of other diseases of the circulatory system: Secondary | ICD-10-CM

## 2017-07-18 DIAGNOSIS — B182 Chronic viral hepatitis C: Secondary | ICD-10-CM

## 2017-07-18 MED ORDER — BUPRENORPHINE HCL-NALOXONE HCL 8-2 MG SL FILM: 1.0000 | ORAL_FILM | Freq: Three times a day (TID) | SUBLINGUAL | 0 refills | Status: DC

## 2017-07-18 NOTE — Assessment & Plan Note (Signed)
Severe opioid use disorder under excellent control on Suboxone 24mg  daily.  He has had no relapses over the last 1 month.  Cravings are well controlled.  Tolerating medical therapy with no side effects.  Plan for U tox today.  Refill Suboxone for 1 month supply, follow-up in clinic in 4 weeks.

## 2017-07-18 NOTE — Assessment & Plan Note (Signed)
Patient completed 12-week course of Epclusa without Ribavirin.  Tolerated well.  Now off medications.  HCV virus now undetectable.  Will get labs checked in 2 more months, but likely cured.

## 2017-07-18 NOTE — Progress Notes (Signed)
   07/18/2017  Jaquavious A Norgard presents for follow up of opioid use disorder I have reviewed the prior induction visit, follow up visits, and telephone encounters relevant to opiate use disorder (OUD) treatment.   Current daily dose: Suboxone 24mg  daily  Date of Induction: 11/09/2016  Current follow up interval, in weeks: 4  The patient has been adherent with the buprenorphine for OUD contract.   Last UDS Result: Appropriate for Bupe and metabolite  HPI: 27 year old male with severe opioid use disorder and a history of tricuspid endocarditis no doing very well on treatment with Suboxone.  He reports no relapses in the last month.  Reports cravings are well controlled.  No adverse side effects from Suboxone. Lives in [place] with his girlfriend, they have a small child who is doing well. He is working as an Personnel officerelectrician. He says he is happy right now, says his life is good, much better than it was before.  He completed therapy for his hepatitis C with the ID clinic.  Exam:   Vitals:   07/18/17 0932  BP: 124/84  Pulse: 64  Resp: 18  Temp: 97.7 F (36.5 C)  TempSrc: Oral  SpO2: (!) 9%  Weight: 172 lb 1.6 oz (78.1 kg)    General: Well-appearing young man, no distress. Extremities: Warm and well perfused. Neuro: Alert and oriented, conversational, normal strength, normal gait Psych: Appropriate mood and affect today, well put together  Assessment/Plan:  See Problem Based Charting in the Encounters Tab   [name], Stanford Strauch Thomas, MD  07/18/2017  9:22 AM

## 2017-07-18 NOTE — Patient Instructions (Signed)
You are doing great. Keep up the good work, continue medication as prescribed. We will see you back in 4 weeks.

## 2017-07-27 LAB — TOXASSURE SELECT,+ANTIDEPR,UR

## 2017-08-15 ENCOUNTER — Ambulatory Visit (INDEPENDENT_AMBULATORY_CARE_PROVIDER_SITE_OTHER): Payer: Self-pay | Admitting: Student in an Organized Health Care Education/Training Program

## 2017-08-15 VITALS — BP 127/90 | HR 73 | Temp 97.8°F | Resp 20 | Wt 177.7 lb

## 2017-08-15 DIAGNOSIS — Z8679 Personal history of other diseases of the circulatory system: Secondary | ICD-10-CM

## 2017-08-15 DIAGNOSIS — F1120 Opioid dependence, uncomplicated: Secondary | ICD-10-CM

## 2017-08-15 MED ORDER — BUPRENORPHINE HCL-NALOXONE HCL 8-2 MG SL FILM: 1.0000 | ORAL_FILM | Freq: Three times a day (TID) | SUBLINGUAL | 0 refills | Status: DC

## 2017-08-15 NOTE — Patient Instructions (Signed)
We talked about trying to slowly taper down to 2 films a day over the next month. Remember that the risk is increased cravings, withdrawal symptoms, and relapse. So if you feel those symptoms, go back to three films a day. See you back in one month.

## 2017-08-15 NOTE — Progress Notes (Signed)
   08/15/2017  Cody Rios presents for follow up of opioid use disorder I have reviewed the prior induction visit, follow up visits, and telephone encounters relevant to opiate use disorder (OUD) treatment.   Current daily dose: Suboxone 24 mg daily  Date of Induction: 11/09/2016  Current follow up interval, in weeks: 4  The patient has been adherent with the buprenorphine for OUD contract.   Last UDS Result: Appropriate for bupe and metabolite, no full agonist opioid  HPI: 27 year old man here for follow up of severe opioid use disorder.  Patient is doing very well.  He reports cravings are well controlled, no relapses over the last 1 month.  Social situation sounds stable.  He is working a full-time job, lives with his partner and they have a infant daughter. We talked about starting to form a plan for when the pharmaceutical assistance program ends in August, only three months away. He is interested in transitioning to 2 films daily slowly in case he needs to pay out of pocket for generic bupe.   Exam:   Vitals:   08/15/17 0854  BP: 127/90  Pulse: 73  Resp: 20  Temp: 97.8 F (36.6 C)  TempSrc: Oral  SpO2: 97%  Weight: 177 lb 11.2 oz (80.6 kg)    Gen: well appearing young man, no distress CV: RRR, no murmur Lungs: clear, unlabored Ext: warm, no edema Psych: normal affect, not depressed or anxious appearing, well put together and appropriate.  Assessment/Plan:  See Problem Based Charting in the Encounters Tab    [name], MD  08/15/2017  9:12 AM

## 2017-08-15 NOTE — Assessment & Plan Note (Signed)
Severe opioid use disorder with a history of infectious endocarditis but has been doing very well on treatment with Suboxone.  Last urine drug test was appropriate for bupe, metabolites, and no illicit opioids.  Controlled database appropriate today.  Currently he receives Suboxone through the pharmaceutical assistance program.  This will end in August.  He is worried about affording 3 films/tabs daily after that point.  We set a plan to try slowly tapering down to 16 mg daily.  I advised that the risk during this can taper his increased cravings, withdrawal symptoms, and ultimately leading to relapse.  Patient understands.  He is going to do very slow trial of reducing by a half or quarter film.  I advised if he has any other symptoms though to go back to the full 24 mg daily.  Follow-up with me in 1 month.

## 2017-09-12 ENCOUNTER — Ambulatory Visit (INDEPENDENT_AMBULATORY_CARE_PROVIDER_SITE_OTHER): Payer: Self-pay | Admitting: Student in an Organized Health Care Education/Training Program

## 2017-09-12 VITALS — BP 113/75 | HR 84 | Temp 98.2°F | Resp 20 | Wt 177.4 lb

## 2017-09-12 DIAGNOSIS — F1120 Opioid dependence, uncomplicated: Secondary | ICD-10-CM

## 2017-09-12 MED ORDER — BUPRENORPHINE HCL-NALOXONE HCL 8-2 MG SL FILM: 1.0000 | ORAL_FILM | Freq: Three times a day (TID) | SUBLINGUAL | 0 refills | Status: DC

## 2017-09-12 NOTE — Progress Notes (Signed)
   09/12/2017  Cody Rios presents for follow up of opioid use disorder I have reviewed the prior induction visit, follow up visits, and telephone encounters relevant to opiate use disorder (OUD) treatment.   Current daily dose: Suboxone 24 mg daily  Date of Induction: 11/09/2016  Current follow up interval, in weeks: 4  The patient has been adherent with the buprenorphine for OUD contract.   Last UDS Result: Appropriate  HPI: 27 year old man here for follow-up of severe opioid use disorder.  Hospitalization was in August 2018 with acute bacterial endocarditis causing severe tricuspid valve regurgitation.  He has been remarkably stable since that time on treatment with Suboxone.  Again he is continuing to do well with no relapses, cravings very well controlled.  At last visit we talked about decreasing his dose of Suboxone from 24 mg to 16 mg daily.  After he got home he decided not to make this change because of fear of relapse, increasing cravings.  He is tolerating very well with no side effects.  He denies constipation.  Reports good exertional capacity, working a full-time job as an Personnel officerelectrician.  No chest pain or shortness of breath, no lower extremity swelling.  Exam:   Vitals:   09/12/17 0904  BP: 113/75  Pulse: 84  Resp: 20  Temp: 98.2 F (36.8 C)  TempSrc: Oral  SpO2: 96%  Weight: 177 lb 6.4 oz (80.5 kg)    Gen: Well appearing, NAD Neck: No cervical LAD, No thyromegaly or nodules, No JVD. CV: RRR, no murmurs Ext: Warm, no edema, normal joints Skin: No atypical appearing moles. No rashes, no skin or soft tissue infections  Assessment/Plan:  See Problem Based Charting in the Encounters Tab   Cody Rios, Cody Whichard Thomas, MD  09/12/2017  9:16 AM

## 2017-09-12 NOTE — Patient Instructions (Signed)
You are doing great work.  Please keep let us know if you have any problems or if there is anything more we can do for you.it up.

## 2017-09-12 NOTE — Assessment & Plan Note (Signed)
Stable, under great control for over 6 months now.  Unable to taper down the Suboxone dose because of fear of relapse.  I think this is fine.  Plan to continue with Suboxone 8 mg, 3 films daily.  Pharmaceutical assistance program will likely run out in 2-3 months.  We will have to figure out cost and availability after that.  Urine substance test today.

## 2017-09-19 LAB — TOXASSURE SELECT,+ANTIDEPR,UR

## 2017-09-20 ENCOUNTER — Telehealth: Payer: Self-pay

## 2017-09-20 NOTE — Telephone Encounter (Signed)
Patient is looking for his suboxene, went to his pharmacy it isn't there, Pls call patient and tell him where was it sent to.

## 2017-09-20 NOTE — Telephone Encounter (Signed)
Spoke to pharmacy, they had it "on file" Called pt informed him

## 2017-09-25 ENCOUNTER — Ambulatory Visit (INDEPENDENT_AMBULATORY_CARE_PROVIDER_SITE_OTHER): Payer: Self-pay | Admitting: Pharmacist

## 2017-09-25 ENCOUNTER — Ambulatory Visit: Payer: Self-pay

## 2017-09-25 DIAGNOSIS — B182 Chronic viral hepatitis C: Secondary | ICD-10-CM

## 2017-09-25 LAB — COMPREHENSIVE METABOLIC PANEL
AG RATIO: 1.5 (calc) (ref 1.0–2.5)
ALT: 16 U/L (ref 9–46)
AST: 24 U/L (ref 10–40)
Albumin: 4.6 g/dL (ref 3.6–5.1)
Alkaline phosphatase (APISO): 67 U/L (ref 40–115)
BUN: 14 mg/dL (ref 7–25)
CO2: 29 mmol/L (ref 20–32)
Calcium: 9.9 mg/dL (ref 8.6–10.3)
Chloride: 98 mmol/L (ref 98–110)
Creat: 0.97 mg/dL (ref 0.60–1.35)
GLUCOSE: 127 mg/dL — AB (ref 65–99)
Globulin: 3 g/dL (calc) (ref 1.9–3.7)
Potassium: 4.6 mmol/L (ref 3.5–5.3)
Sodium: 137 mmol/L (ref 135–146)
Total Bilirubin: 0.7 mg/dL (ref 0.2–1.2)
Total Protein: 7.6 g/dL (ref 6.1–8.1)

## 2017-09-25 NOTE — Progress Notes (Signed)
HPI: Cody Rios is a 27 y.o. male who presents to the [facility] pharmacy clinic for his Hep C cure visit. 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  Patient Active Problem List   Diagnosis Date Noted  . Opioid use disorder, severe, on maintenance therapy (HCC) 11/22/2016  . Severe tricuspid regurgitation 11/10/2016    Patient's Medications  New Prescriptions   No medications on file  Previous Medications   BUPRENORPHINE HCL-NALOXONE HCL 8-2 MG FILM    Place 1 Film under the tongue 3 (three) times daily.  Modified Medications   No medications on file  Discontinued Medications   No medications on file    Allergies: Allergies  Allergen Reactions  . Cephalosporins Hives  . Penicillins Hives, Shortness Of Breath and Rash    Past Medical History: Past Medical History:  Diagnosis Date  . 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 Hepatitis C infection 11/22/2016   Treated 2019  . MSSA bacteremia 11/10/2016  . Opioid use disorder, severe, on maintenance therapy (HCC) 11/22/2016    Social History: Social History   Socioeconomic History  . Marital status: Single    Spouse name: Not on file  . Number of children: Not on file  . Years of education: Not on file  . Highest education level: Not on file  Occupational History  . Not on file  Social Needs  . Financial resource strain: Not on file  . Food insecurity:    Worry: Not on file    Inability: Not on file  . Transportation needs:    Medical: Not on file    Non-medical: Not on file  Tobacco Use  . Smoking status: Never Smoker  . Smokeless tobacco: Current User    Types: Chew  Substance and Sexual Activity  . Alcohol use: No  . Drug use: No    Types: IV, Heroin    Comment: Last used: 11/09/16  . Sexual activity: Not on file  Lifestyle  . Physical activity:    Days per week: Not on file    Minutes per session: Not on file  . Stress: Not on file  Relationships  . Social  connections:    Talks on phone: Not on file    Gets together: Not on file    Attends religious service: Not on file    Active member of club or organization: Not on file    Attends meetings of clubs or organizations: Not on file    Relationship status: Not on file  Other Topics Concern  . Not on file  Social History Narrative  . Not on file    Labs: Hepatitis C Lab Results  Component Value Date   HCVRNAPCRQN <15 NOT DETECTED 06/28/2017   FIBROSTAGE F0-F1 12/20/2016   Hepatitis B Lab Results  Component Value Date   HEPBSAG Negative 11/13/2016   Hepatitis A Lab Results  Component Value Date   HAV Positive (A) 11/13/2016   HIV Lab Results  Component Value Date   HIV Non Reactive 11/09/2016   Lab Results  Component Value Date   CREATININE 0.89 06/28/2017   CREATININE 0.81 12/20/2016   CREATININE 0.90 11/17/2016   CREATININE 0.82 11/16/2016   CREATININE 0.72 11/15/2016   Lab Results  Component Value Date   AST 31 06/28/2017   AST 64 (H) 12/20/2016   AST 35 11/12/2016   ALT 15 06/28/2017   ALT 83 (H) 12/20/2016   ALT 87 (H)  12/20/2016   INR 1.1 12/20/2016   INR 1.18 11/10/2016   INR 1.27 11/09/2016    Fibrosis Score: F0/F1 as assessed by [facility]   Assessment: Cody Rios is here today to follow-up for his Hep C infection.  He completed Epclusa back in March. His end of therapy Hep C viral load was undetectable.  Will repeat a viral load today to see if he is cured.  Counseled him on the risk of re-infection and told him his Hep C antibody would always be positive.  Will call him with the results.   Plan: - Hep C viral load for cure  Ajai Harville L. Verona Hartshorn, PharmD, AAHIVP, CPP Infectious Diseases Clinical Pharmacist [facility] 09/25/2017, 3:50 PM

## 2017-09-27 LAB — HEPATITIS C RNA QUANTITATIVE
HCV QUANT LOG: NOT DETECTED {Log_IU}/mL
HCV RNA, PCR, QN: NOT DETECTED [IU]/mL

## 2017-09-29 ENCOUNTER — Telehealth: Payer: Self-pay | Admitting: Pharmacist

## 2017-09-29 NOTE — Telephone Encounter (Signed)
Called Cody Rios to let him know that his Hep C viral load cure lab was undetectable and he is cured of his Hepatitis C. Re-educated on the risk of reinfection.

## 2017-10-10 ENCOUNTER — Ambulatory Visit (INDEPENDENT_AMBULATORY_CARE_PROVIDER_SITE_OTHER): Payer: Self-pay | Admitting: Internal Medicine

## 2017-10-10 VITALS — BP 139/86 | HR 66 | Temp 98.1°F | Wt 171.7 lb

## 2017-10-10 DIAGNOSIS — I071 Rheumatic tricuspid insufficiency: Secondary | ICD-10-CM

## 2017-10-10 DIAGNOSIS — F1120 Opioid dependence, uncomplicated: Secondary | ICD-10-CM

## 2017-10-10 MED ORDER — BUPRENORPHINE HCL-NALOXONE HCL 8-2 MG SL FILM: 1.0000 | ORAL_FILM | Freq: Three times a day (TID) | SUBLINGUAL | 0 refills | Status: DC

## 2017-10-10 NOTE — Assessment & Plan Note (Addendum)
Patient is doing well.  He has not used any substances since previous visit.  He does not have any cravings and says that he is able to live his life without difficulties.  Talked to patient about amphetamine in his previous U tox and he mentioned that he obtains and takes Adderall to help him concentrate.  Spoke to patient at length about him going to [facility] so that he can be assessed for possible ADHD and be given a prescription for Adderall which will be at a lower cost for him and also allow his medication to be documented in his file.  The patient expressed understanding.  -Refilled patient's suboxone 24mg  qd -Follow up in 4 weeks  -Recommended patient follow up regarding ADHD diagnosis

## 2017-10-10 NOTE — Patient Instructions (Signed)
It was a pleasure to see you today [name]. Please make the following changes:  -Please continue using suboxone 24mg   -Please follow up in 4 weeks  If you have any questions or concerns, please call our clinic at [phone] between 9am-5pm and after hours call [phone] and ask for the internal medicine resident on call. If you feel you are having a medical emergency please call 911.   Thank you, we look forward to help you remain healthy!  Lorenso CourierVahini Rhonda Vangieson, MD Internal Medicine PGY1

## 2017-10-10 NOTE — Progress Notes (Signed)
   10/10/2017  Cody Rios presents for follow up of opioid use disorder I have reviewed the prior induction visit, follow up visits, and telephone encounters relevant to opiate use disorder (OUD) treatment.   Current daily dose: Suboxone 24mg  qd  Date of Induction: 11/09/16  Current follow up interval, in weeks: 4  The patient has been adherent with the buprenorphine for OUD contract.   Last UDS Result: 09/12/17 expected for methyl amphetamine  [place] Controlled substance database: Reviewed and no other controlled substances being prescribed at this time   HPI: [name] is a 27 year old male with severe tricuspid regurg and opioid use disorder who presents for OUD follow-up.  Patient states that he is doing well and does not have any cravings or withdrawal symptoms.  States that he is methamphetamine to help him focus while at work.  Exam:   Vitals:   10/10/17 0919  BP: 139/86  Pulse: 66  Temp: 98.1 F (36.7 C)  TempSrc: Oral  SpO2: 98%  Weight: 171 lb 11.2 oz (77.9 kg)   Physical Exam  Constitutional: He appears well-developed and well-nourished. No distress.  HENT:  Head: Normocephalic and atraumatic.  Eyes: Conjunctivae are normal.  Cardiovascular: Normal rate, regular rhythm and normal heart sounds.  Respiratory: Effort normal and breath sounds normal. No respiratory distress. He has no wheezes.  GI: Soft. Bowel sounds are normal. He exhibits no distension. There is no tenderness.  Musculoskeletal: He exhibits no edema.  Neurological: He is alert.  Skin: He is not diaphoretic. No erythema.  Psychiatric: He has a normal mood and affect. His behavior is normal. Judgment and thought content normal.   Assessment/Plan:  See Problem Based Charting in the Encounters Tab   Lorenso Courierhundi, Gudrun Axe, MD  10/10/2017  9:25 AM

## 2017-10-13 NOTE — Progress Notes (Signed)
Internal Medicine Clinic Attending  I saw and evaluated the patient.  I personally confirmed the key portions of the history and exam documented by Dr. Chundi and I reviewed pertinent patient test results.  The assessment, diagnosis, and plan were formulated together and I agree with the documentation in the resident's note. 

## 2017-10-13 NOTE — Addendum Note (Signed)
Addended by: Debe CoderMULLEN, EMILY B on: 10/13/2017 01:17 PM   Modules accepted: Level of Service

## 2017-10-17 LAB — TOXASSURE SELECT,+ANTIDEPR,UR

## 2017-11-07 ENCOUNTER — Ambulatory Visit (INDEPENDENT_AMBULATORY_CARE_PROVIDER_SITE_OTHER): Payer: Self-pay | Admitting: Internal Medicine

## 2017-11-07 DIAGNOSIS — F1120 Opioid dependence, uncomplicated: Secondary | ICD-10-CM

## 2017-11-07 MED ORDER — BUPRENORPHINE HCL-NALOXONE HCL 8-2 MG SL FILM: 1.0000 | ORAL_FILM | Freq: Three times a day (TID) | SUBLINGUAL | 0 refills | Status: DC

## 2017-11-07 NOTE — Assessment & Plan Note (Signed)
Cody Rios is doing very well.  He has been well maintained on suboxone for a year now.  We will try to wean to 2.5 tab/films per day and see how he does.  I advised him that if he were to have any cravings or return of symptoms, he should go back to TID dosing.  He has not taken any further amphetamine off the street.  I provided him with counseling services flier.    Plan Wean to suboxone 2.5 films/tabs per day if possible [facility] possible [facility] and psychiatric services if possible ( I think they only do counseling, consider [facility] in the future ) Return in 4 weeks

## 2017-11-07 NOTE — Patient Instructions (Signed)
Amalia Haileyustin - -  Thank you for coming in today!  For your suboxone, the free trial is about to run out from the company.  Please try to wean down to 2.5 films/tabs per day and see how you do.  If you are not able to handle this lower dose, go back to 3 films/tabs per day and we will discuss payment options at next visit.    Please make sure to take a dose that helps keep your cravings and withdrawal in control.  If you need to continue on 3 per day, do so!  Come back in 4 weeks.   Thank you!

## 2017-11-07 NOTE — Progress Notes (Signed)
   11/07/2017  Cody Rios presents for follow up of opioid use disorder I have reviewed the prior induction visit, follow up visits, and telephone encounters relevant to opiate use disorder (OUD) treatment.   Current daily dose: 24mg  buprenorphine daily  Date of Induction: 11/09/16  Current follow up interval, in weeks: 4 weeks  The patient has been adherent with the buprenorphine for OUD contract.   Last UDS Result: ETOH and buprenorphine, no further methamphetamine  HPI: Cody Rios is a 27yo man with h/o severe OUD on maintenance therapy.  He has been doing very well.  He has been in therapy for just at a year and stable for well over 9 months.  He is getting his medication through an assistance program currently.  Today he reports no cravings, use or use of other substances.  He is stable in a relationship with his girlfriend and their infant daughter.  He does not have insurance.  Today we discussed ways to pay for his medications going forward, including seeing if he was able to wean to a lower dose.  I advised him that if he were to have any return of cravings/symptoms he should go back to TID dosing.   Exam:   Vitals:   11/07/17 0957  BP: 137/89  Pulse: 60  Resp: 20  Temp: 98.1 F (36.7 C)  TempSrc: Oral  SpO2: 98%  Weight: 140 lb 12.8 oz (63.9 kg)    General: Well dressed, well appearing young man in NAD Eyes: Anicteric sclerae, no conjunctival pallor Pulm: Breathing comfortably, no wheezing Psych: Pleasant, conversant, normal mood.   Assessment/Plan:  See Problem Based Charting in the Encounters Tab     Inez CatalinaMullen, Candida Vetter B, MD  11/07/2017  11:33 AM

## 2017-12-19 ENCOUNTER — Ambulatory Visit (INDEPENDENT_AMBULATORY_CARE_PROVIDER_SITE_OTHER): Payer: Self-pay | Admitting: Internal Medicine

## 2017-12-19 VITALS — BP 140/95 | HR 74 | Temp 98.1°F | Wt 171.3 lb

## 2017-12-19 DIAGNOSIS — F1120 Opioid dependence, uncomplicated: Secondary | ICD-10-CM

## 2017-12-19 MED ORDER — BUPRENORPHINE HCL-NALOXONE HCL 8-2 MG SL FILM: 1.0000 | ORAL_FILM | Freq: Three times a day (TID) | SUBLINGUAL | 0 refills | Status: DC

## 2017-12-19 NOTE — Patient Instructions (Signed)
Amalia Haileyustin - -  You are doing well!  Please come back to see us in 4 weeks.   If you need your prescription sent to [facility] instead of [facility], please call the clinic.  Thank you!

## 2017-12-19 NOTE — Progress Notes (Signed)
   12/19/2017  Cody Rios presents for follow up of opioid use disorder I have reviewed the prior induction visit, follow up visits, and telephone encounters relevant to opiate use disorder (OUD) treatment.   Current daily dose: 24 mg of buprenorphine daily  Date of Induction: 11/09/16  Current follow up interval, in weeks: 4 weeks  The patient has been adherent with the buprenorphine for OUD contract.   Last UDS Result: appropriate   HPI: Cody Rios is a 27yo man with OUD who has been in remission for a year now.  He is doing well.  He has no complaints today.  He reports feeling well.  His main issue today is that his year long assistance with getting his medications has run out and he needs another option.    Exam:   Vitals:   12/19/17 1139  BP: (!) 140/95  Pulse: 74  Temp: 98.1 F (36.7 C)  TempSrc: Oral  SpO2: 97%  Weight: 171 lb 4.8 oz (77.7 kg)    General: Doing well, no acute distress Eyes: Anicteric sclerae HENT: MMM, NCAT Pulm: No breathing difficulties Psych: Pleasant, normal speech.  Assessment/Plan:  See Problem Based Charting in the Encounters Tab     Inez CatalinaMullen, Emily B, MD  12/19/2017  11:45 AM

## 2017-12-21 ENCOUNTER — Other Ambulatory Visit: Payer: Self-pay

## 2017-12-21 DIAGNOSIS — F1120 Opioid dependence, uncomplicated: Secondary | ICD-10-CM

## 2017-12-21 MED ORDER — BUPRENORPHINE HCL-NALOXONE HCL 8-2 MG SL SUBL: 1.0000 | SUBLINGUAL_TABLET | Freq: Three times a day (TID) | SUBLINGUAL | 0 refills | Status: DC

## 2017-12-21 MED FILL — BUPRENORPHINE HCL-NALOXONE: 8-2 | 14 days supply | Qty: 42 | Fill #0

## 2017-12-21 NOTE — Telephone Encounter (Signed)
Attempted to speak w/ pt, his phone is coming and going out of range, will call back in 1 hr

## 2017-12-21 NOTE — Telephone Encounter (Signed)
His program has expired so now he will pay out of pocket, it sounds like dr Criselda Peachesmullen had talked to him about the other program but no paperwork has been done, for now [facility] at [facility]

## 2017-12-21 NOTE — Telephone Encounter (Signed)
Ok. He will have to change to generic tabs instead of the films. I have sent a two week supply.

## 2017-12-21 NOTE — Telephone Encounter (Signed)
Needs to speak with a nurse about   Buprenorphine HCl-Naloxone HCl 8-2 MG FILM   Please call pt back.  

## 2017-12-21 NOTE — Telephone Encounter (Signed)
Thank you Myriam JacobsonHelen. Dr. Criselda PeachesMullen is away and will defer to Dr. Clemencia CourseHofffman or Dr. Oswaldo DoneVincent to fill this.

## 2017-12-26 ENCOUNTER — Encounter: Payer: Self-pay | Admitting: Internal Medicine

## 2017-12-26 NOTE — Assessment & Plan Note (Signed)
Doing well.  No acute issues  Plan Continue current suboxone therapy.  Follow up in 4-5 weeks He will contact us if he needs further assistance getting his medication, he would be a good candidate for transition to [employer] [facility] New medication agreement reviewed and signed.

## 2017-12-27 LAB — TOXASSURE SELECT,+ANTIDEPR,UR

## 2017-12-31 IMAGING — CR DG WRIST 2V*R*
2 series · 2 of 2 positions shown · non-contrast
Comparison: RIGHT hand radiograph July 22, 2010

CLINICAL DATA: Pain and swelling for 3 days.

EXAM:
RIGHT WRIST - 2 VIEW

[x wrist pa right]
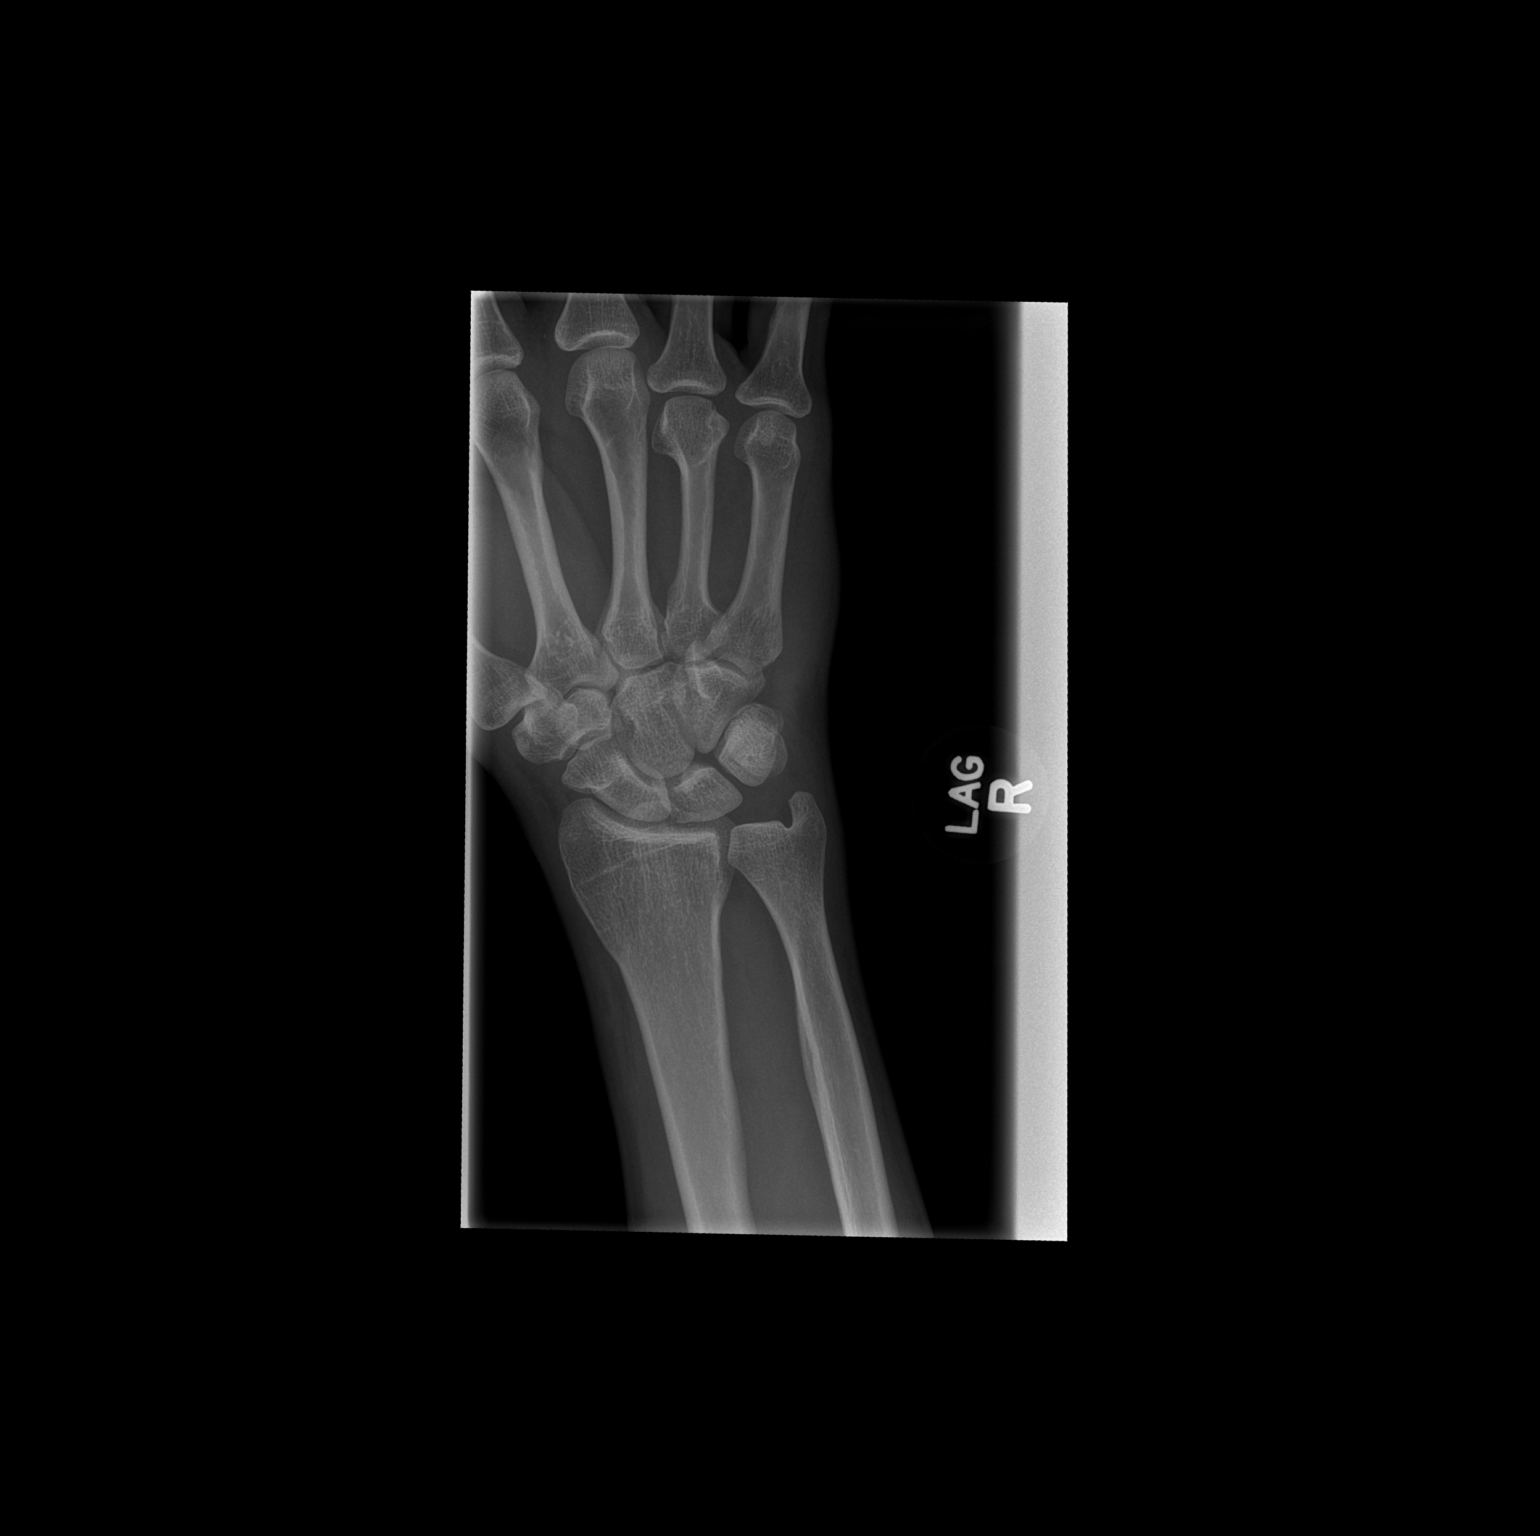

[x wrist lat right]
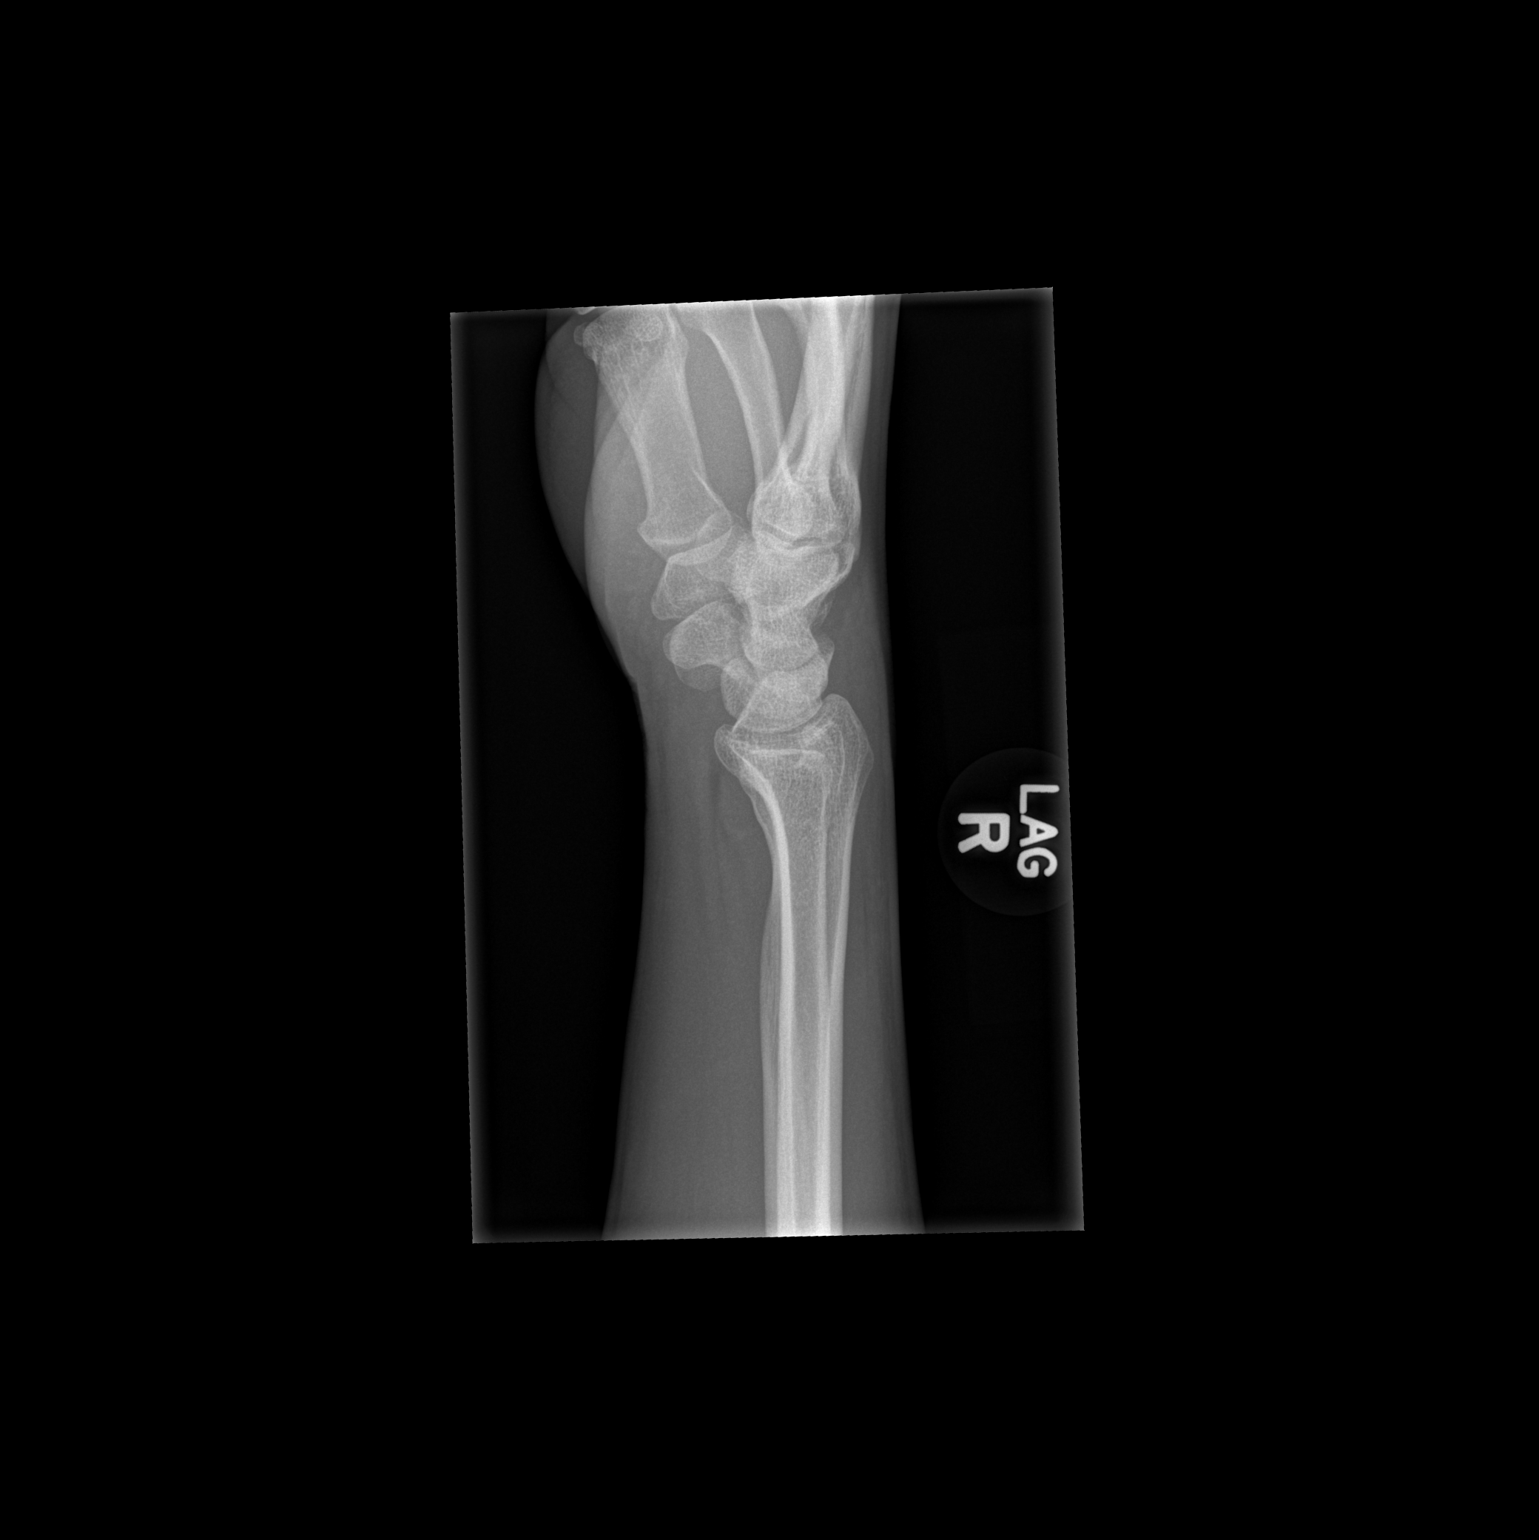

[2 of 2 positions shown; findings below may reference images not displayed]

FINDINGS: There is no evidence of fracture or dislocation. There is no
evidence of arthropathy or other focal bone abnormality. Soft
tissues are unremarkable.
IMPRESSION: Negative.

## 2017-12-31 IMAGING — CR DG KNEE COMPLETE 4+V*R*
4 series · 4 of 4 positions shown · non-contrast
Comparison: None.

CLINICAL DATA: Knee swelling for 3 days, no injury.

EXAM:
RIGHT KNEE - COMPLETE 4+ VIEW

[x knee ap right]
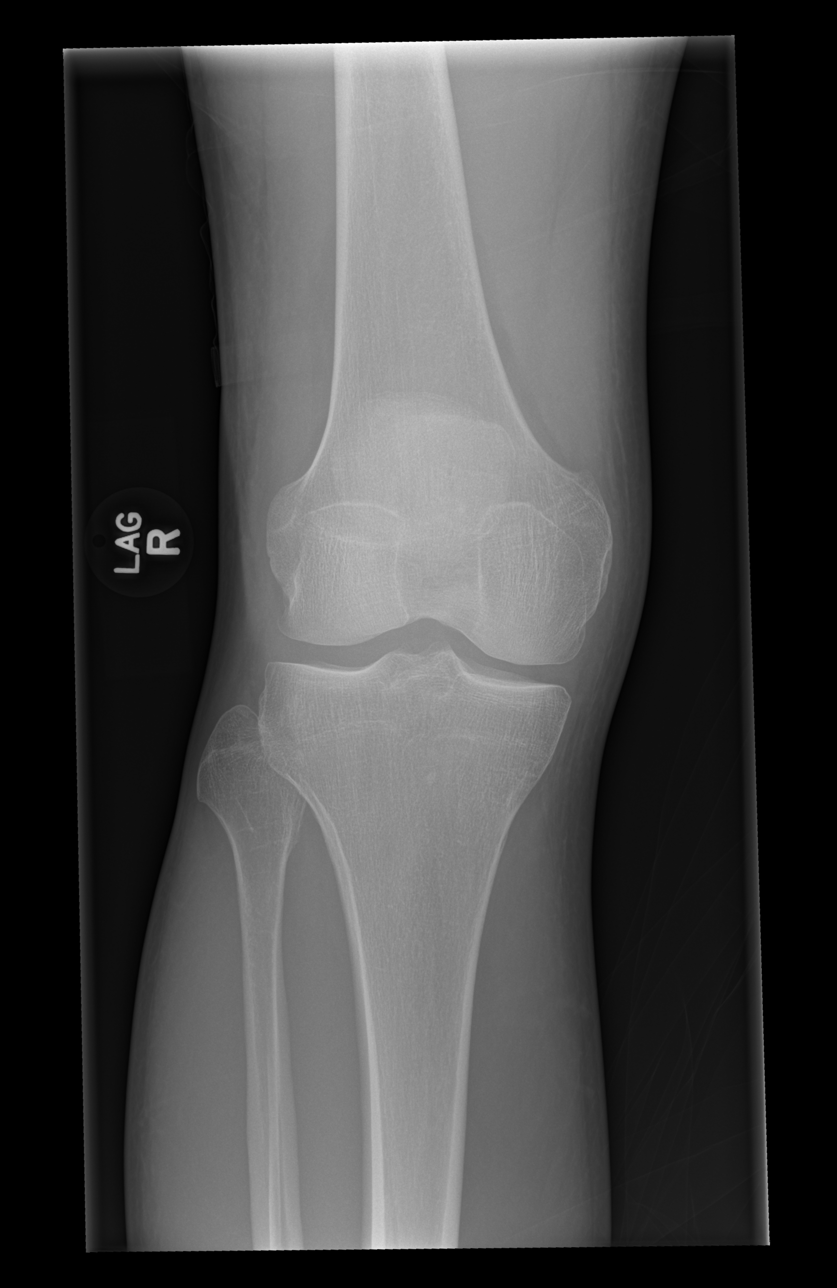

[x knee obl right (1 of 2)]
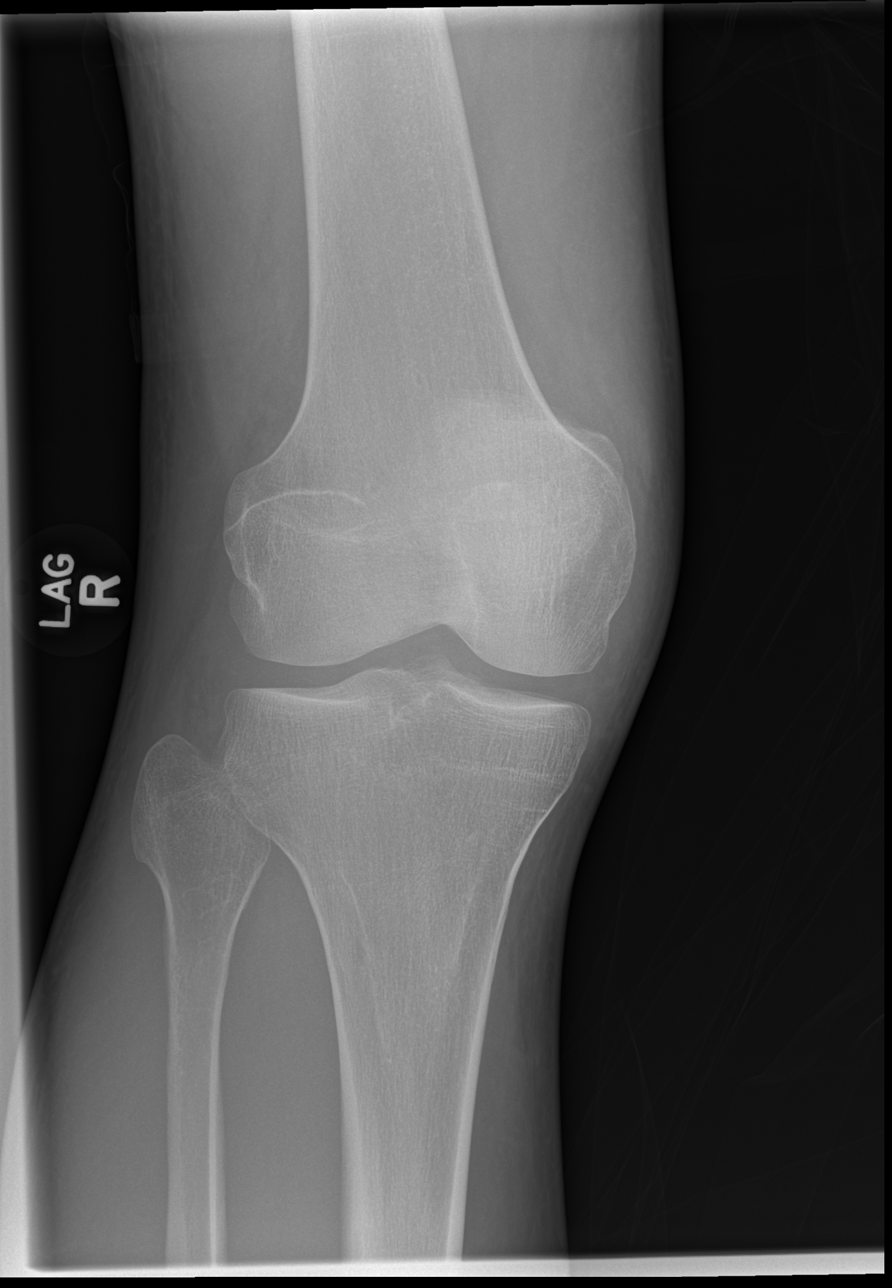

[x knee obl right (2 of 2)]
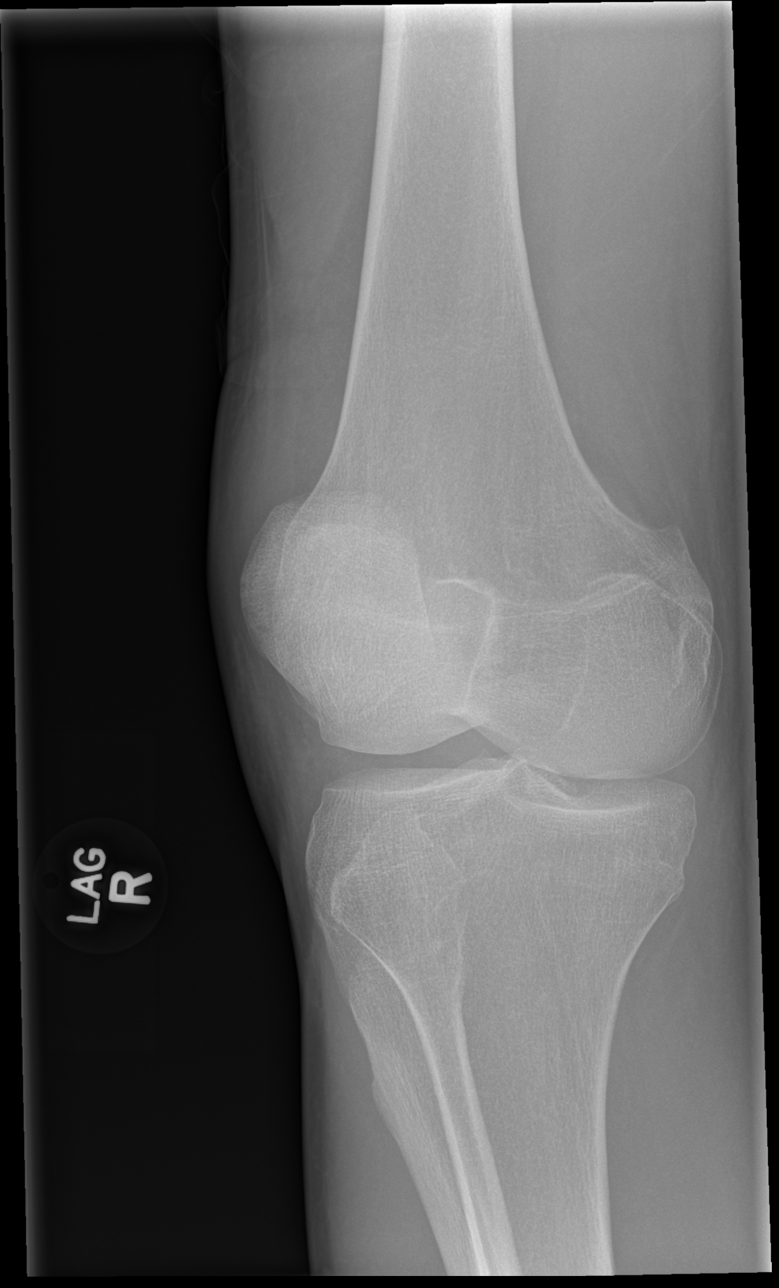

[x knee lat right]
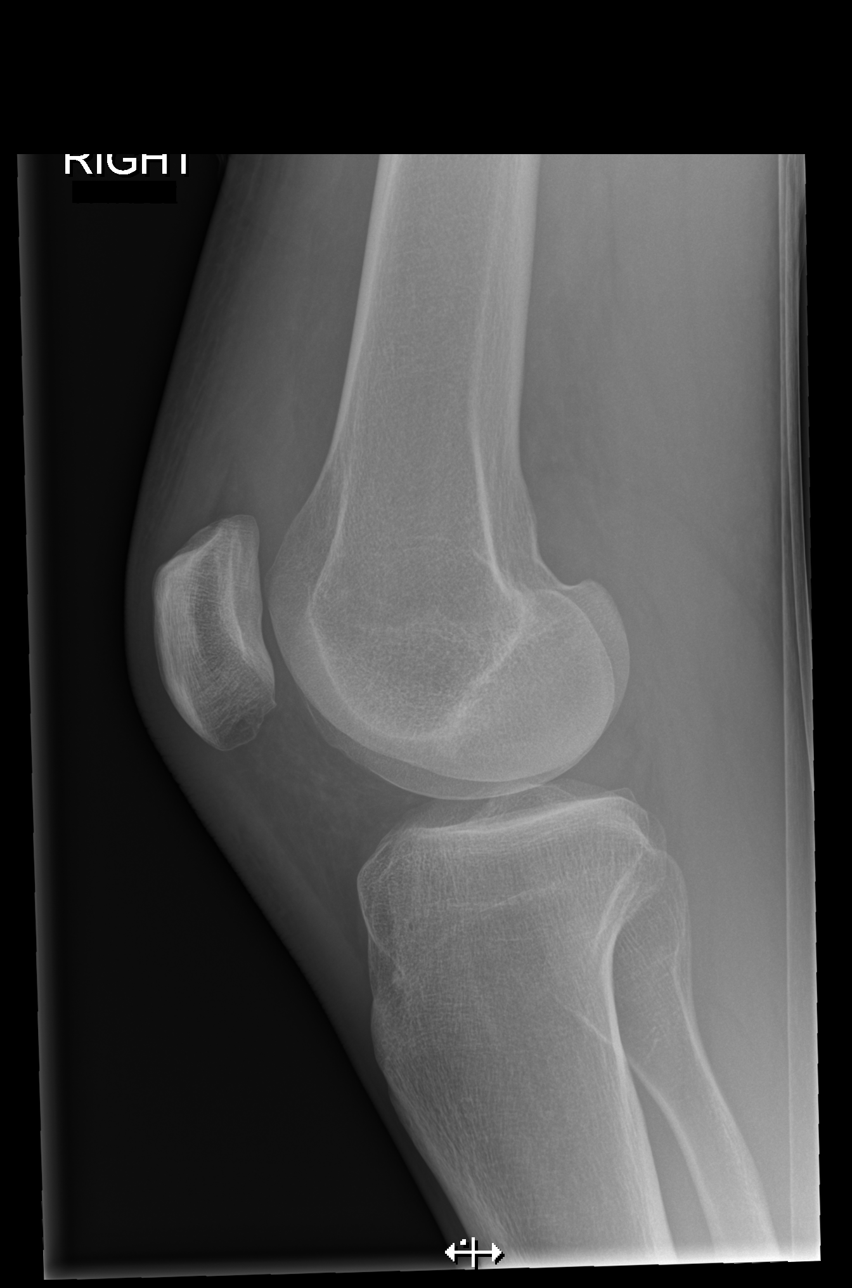

[4 of 4 positions shown; findings below may reference images not displayed]

FINDINGS: No evidence of fracture, dislocation, or joint effusion. No evidence
of arthropathy or other focal bone abnormality. Mild prepatellar
soft tissue swelling. Small suprapatellar joint effusion.
IMPRESSION: Soft-tissue swelling and small suprapatellar joint effusion.

No acute osseous process or advanced degenerative change for age.

## 2018-01-07 IMAGING — DX DG CHEST 2V
2 series · 2 of 2 positions shown · non-contrast
Comparison: CT 11/10/2016.  Plain film of 11/09/2016.

CLINICAL DATA: Endocarditis. Persistent chest pain shortness of
breath.

EXAM:
CHEST  2 VIEW

[w chest pa]
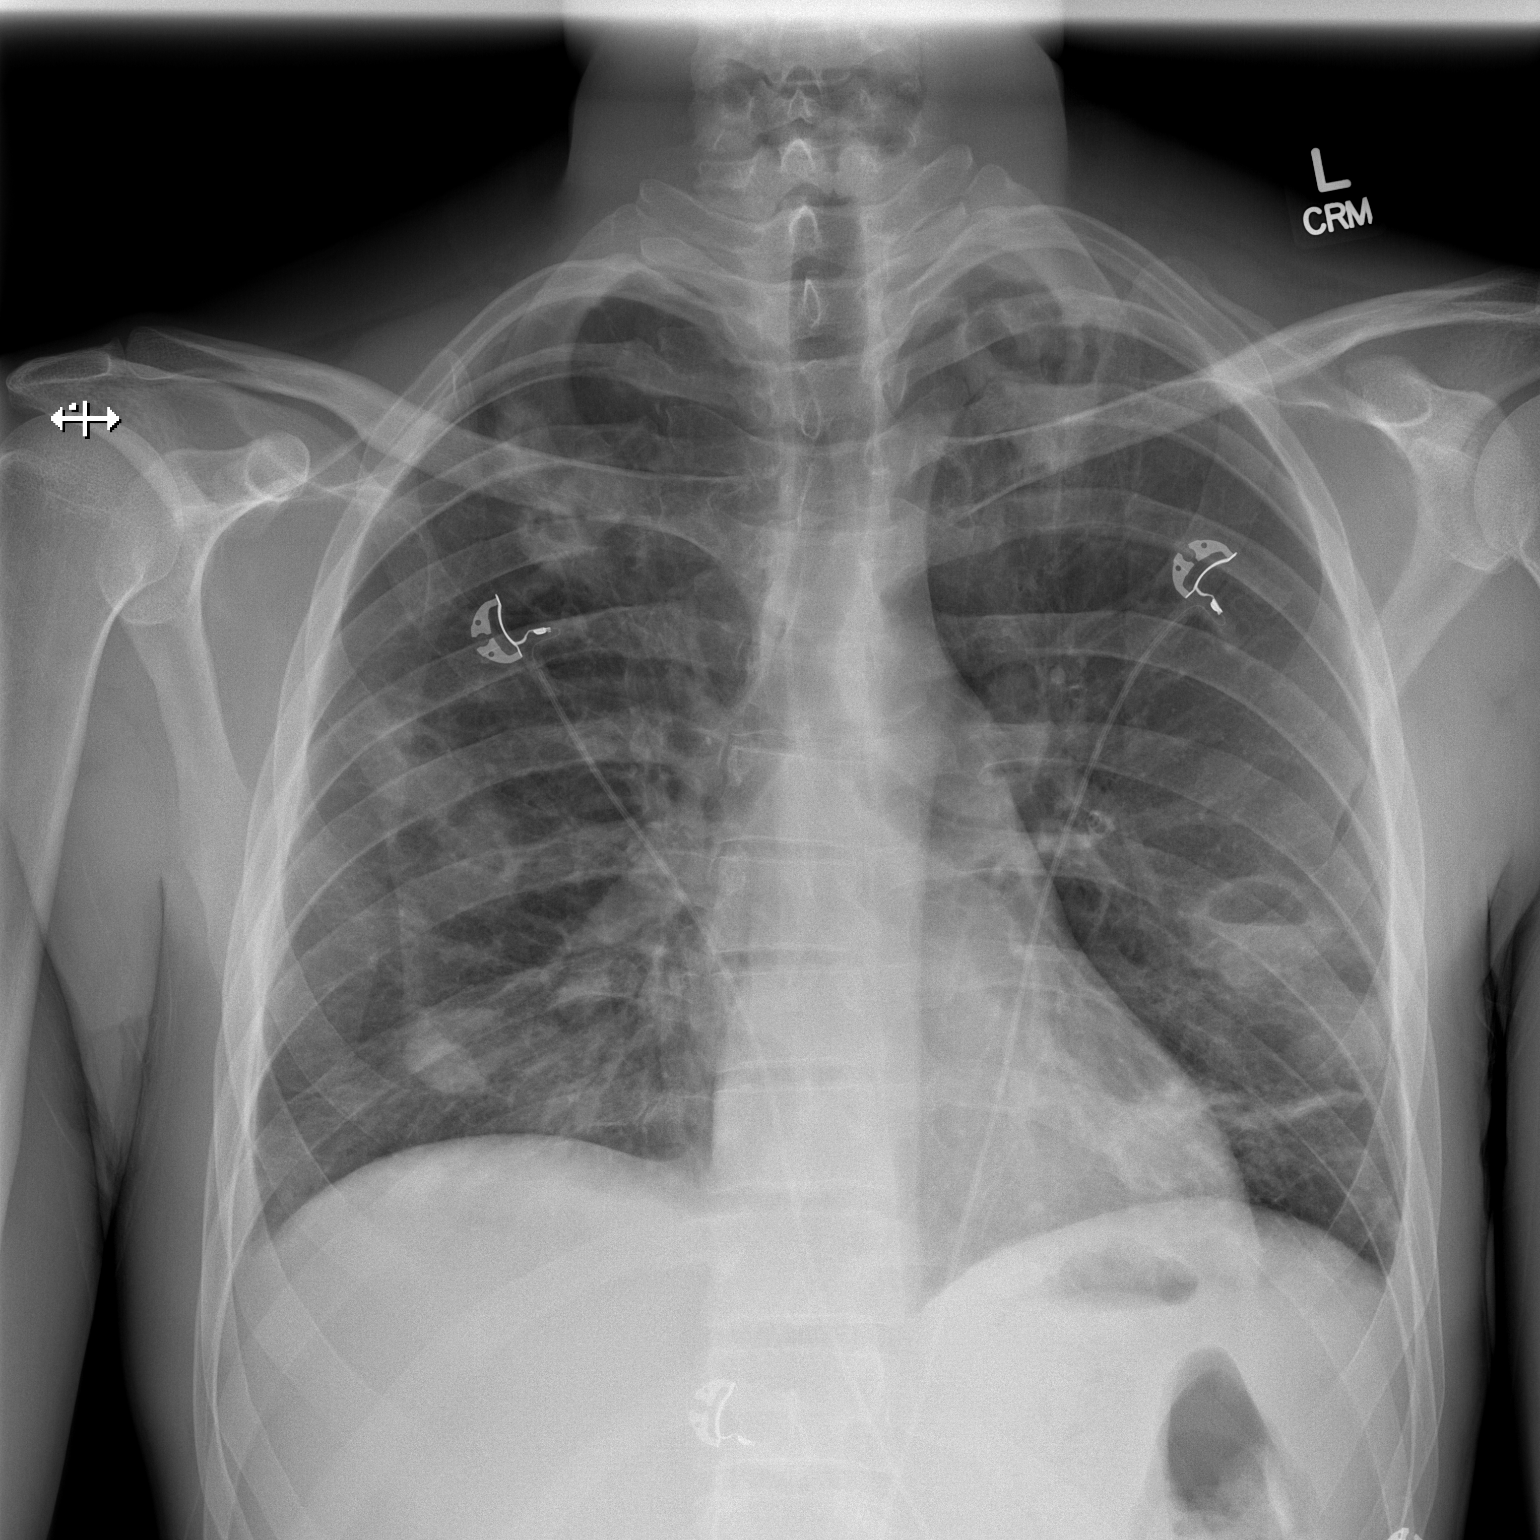

[w chest lat]
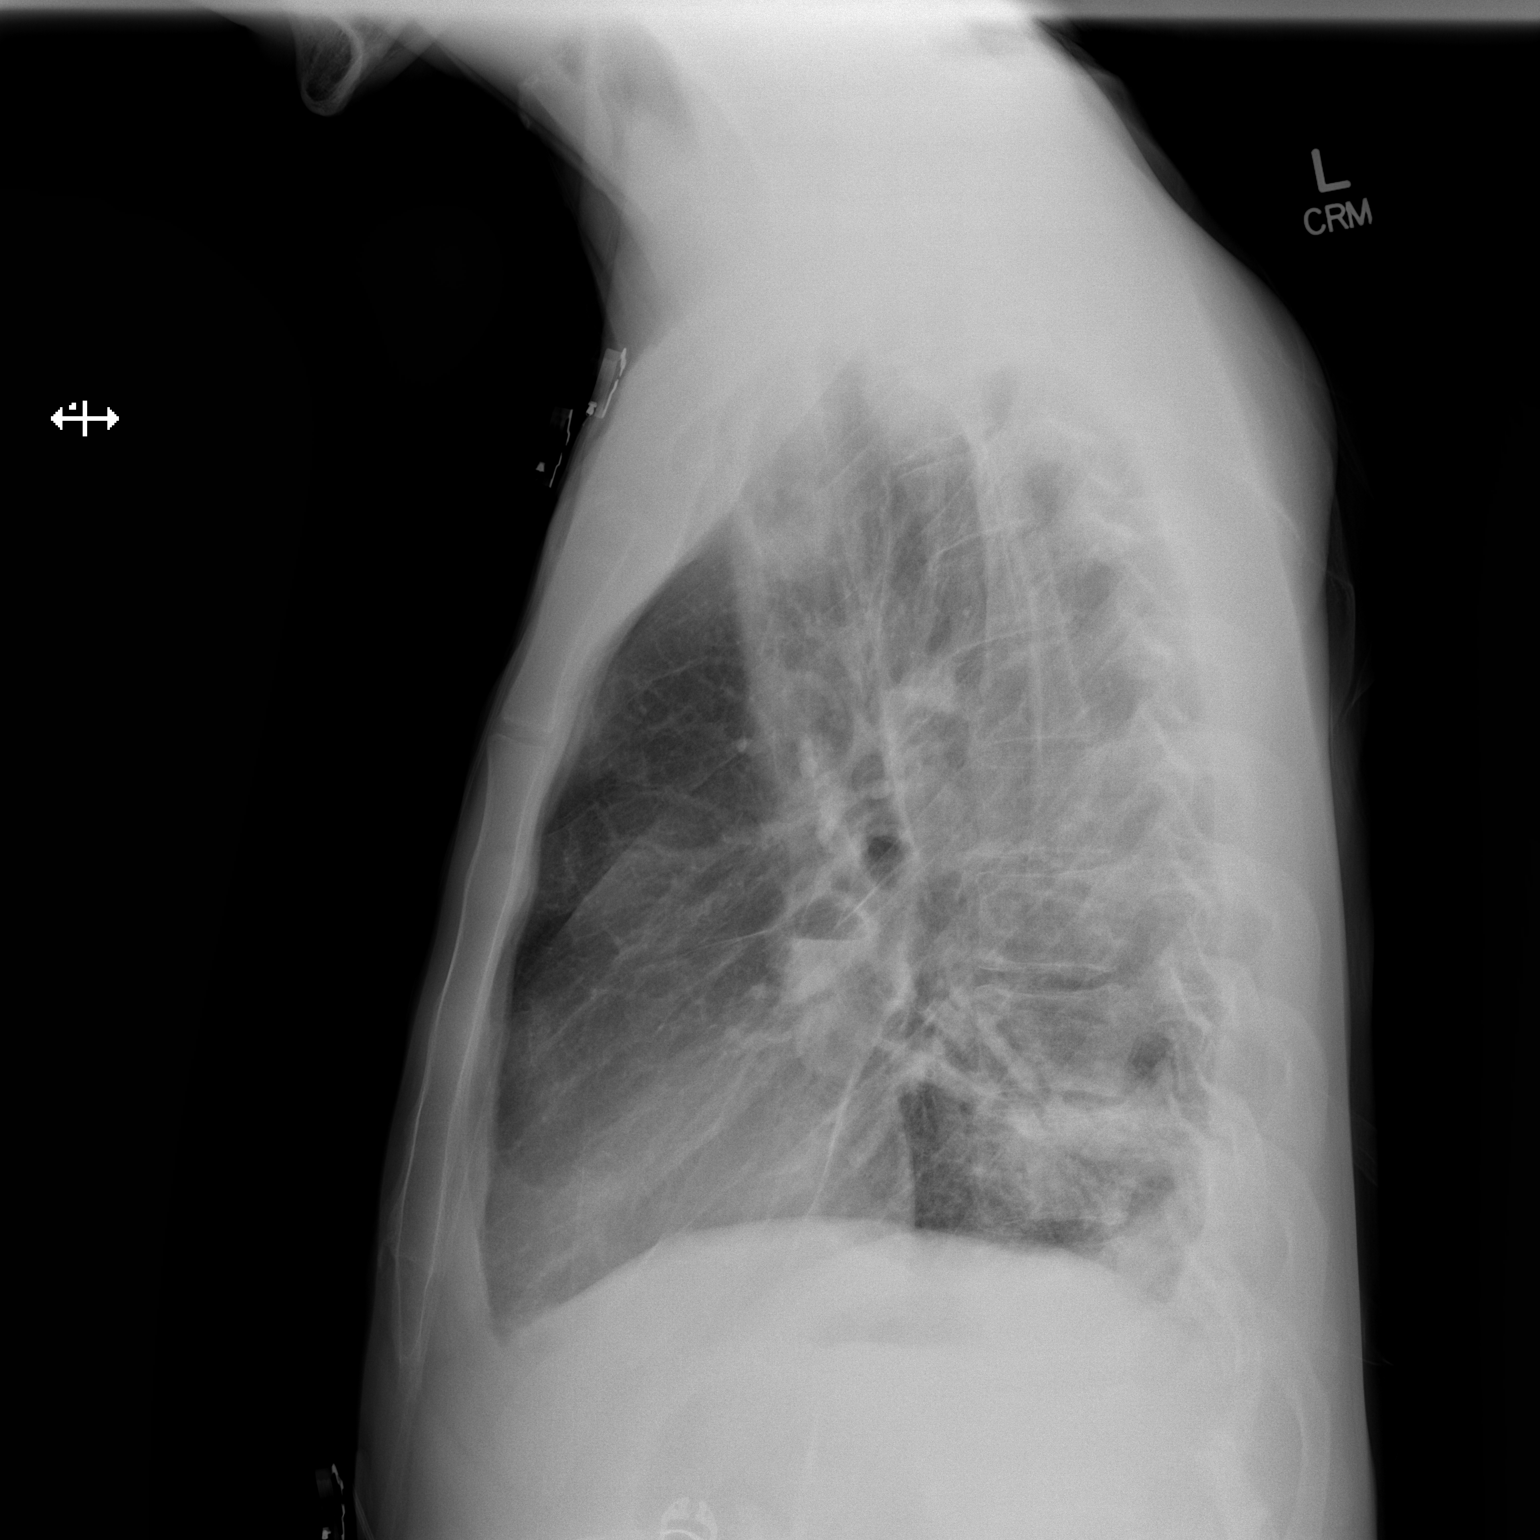

[2 of 2 positions shown; findings below may reference images not displayed]

FINDINGS: Midline trachea. Normal heart size. Trace left pleural fluid. No
pneumothorax. Patchy bilateral pulmonary opacities. Cavitary lesion
in the right upper lobe is grossly similar to the prior CT, given
cross modality comparison. There is also a a lingular lesion with
cavitation which is grossly similar.

Minimal cavitation within the superior segment right lower lobe
lesion. Patchy bibasilar airspace disease also seen. Left apical
area of consolidation demonstrates probable new cavitation.
IMPRESSION: Given cross modality comparison, relatively similar appearance of
multifocal pneumonia and septic emboli. This is progressive compared
11/09/2016 radiograph.

Small left pleural effusion.

## 2018-01-09 ENCOUNTER — Telehealth: Payer: Self-pay

## 2018-01-09 NOTE — Telephone Encounter (Signed)
buprenorphine-naloxone (SUBOXONE) 8-2 mg SUBL SL tablet, refill request @  [facility] - [place], [place] - [address]. [phone] (Phone) [phone] (Fax)

## 2018-01-09 NOTE — Telephone Encounter (Signed)
Para March - do you have time to review?  Thanks

## 2018-01-10 MED ORDER — BUPRENORPHINE HCL-NALOXONE HCL 8-2 MG SL SUBL: 1.0000 | SUBLINGUAL_TABLET | Freq: Three times a day (TID) | SUBLINGUAL | 0 refills | Status: DC

## 2018-01-10 MED FILL — BUPRENORPHIN-NALOXON 8-2 MG: 8-2 | 14 days supply | Qty: 42 | Fill #0

## 2018-01-10 NOTE — Telephone Encounter (Signed)
Now getting 2 week supplies of generic bupe tablet from [facility]. I will send a 2 week rx, will plan on seeing him in clinic next week.

## 2018-01-16 ENCOUNTER — Ambulatory Visit (INDEPENDENT_AMBULATORY_CARE_PROVIDER_SITE_OTHER): Payer: Self-pay | Admitting: Student in an Organized Health Care Education/Training Program

## 2018-01-16 VITALS — BP 170/103 | HR 73 | Temp 97.2°F | Resp 20 | Wt 174.2 lb

## 2018-01-16 DIAGNOSIS — R011 Cardiac murmur, unspecified: Secondary | ICD-10-CM

## 2018-01-16 DIAGNOSIS — Z7289 Other problems related to lifestyle: Secondary | ICD-10-CM

## 2018-01-16 DIAGNOSIS — F1120 Opioid dependence, uncomplicated: Secondary | ICD-10-CM

## 2018-01-16 DIAGNOSIS — I10 Essential (primary) hypertension: Secondary | ICD-10-CM

## 2018-01-16 MED ORDER — BUPRENORPHINE HCL-NALOXONE HCL 8-2 MG SL SUBL: 1.0000 | SUBLINGUAL_TABLET | Freq: Two times a day (BID) | SUBLINGUAL | 0 refills | Status: DC

## 2018-01-16 NOTE — Assessment & Plan Note (Signed)
Elevated blood pressure today, asymptomatic. Probably due to high alcohol use last night. Otherwise normal at all our other visits. I advised to cut back on alcohol use, will recheck it next month. If persistently elevated we can start medication treatment.

## 2018-01-16 NOTE — Assessment & Plan Note (Signed)
Doing very well, controlled on suboxone. Ok getting generic from [facility] outpatient pharmacy. I think we can reduce suboxone to 16mg  daily at this point. I reviewed the database which was appropriate. Last urine substance test also appropriate. Follow up in one month. I sent two 2-week supplies to the pharmacy today.

## 2018-01-16 NOTE — Progress Notes (Signed)
   01/16/2018  Cody Rios presents for follow up of opioid use disorder I have reviewed the prior induction visit, follow up visits, and telephone encounters relevant to opiate use disorder (OUD) treatment.   Current daily dose: Suboxone 24 mg daily  Date of Induction: August 2018  Current follow up interval, in weeks: 4 weeks  The patient has been adherent with the buprenorphine for OUD contract.   Last UDS Result: Appropriate  HPI: 27 year old man here for follow up of OUD. Doing very well, reports good compliance with med, no side effects. He is finding that he does well on 2 tabs a day. He has 10-15 tabs left over at the end fo the month. Celebrated his daughter's one year birthday this month. Broke up with his girlfriend, but says it was a good think, has less stress at home as a result. NO fevers. Working, good exertional capacity.   Exam:   Vitals:   01/16/18 0927  BP: (!) 162/120  Pulse: 73  Resp: 20  Temp: (!) 97.2 F (36.2 C)  TempSrc: Oral  SpO2: 94%  Weight: 174 lb 3.2 oz (79 kg)    Gen: well appearing, no distress CV: RRR, 2/6 early ssystolic murmur at the RUSB Ext: warm, well perfused Neuro: normal strength, normal gait Psych: normal affect, normal mood  Assessment/Plan:  See Problem Based Charting in the Encounters Tab     [name], MD  01/16/2018  9:58 AM

## 2018-01-16 NOTE — Patient Instructions (Addendum)
Keep up the great work. I have sent 2 prescriptions for generic [facility]one to [facility] which you can fill every two weeks.  We will decrease your suboxone to two tablets per day.

## 2018-01-29 ENCOUNTER — Other Ambulatory Visit: Payer: Self-pay | Admitting: Student in an Organized Health Care Education/Training Program

## 2018-01-29 MED ORDER — BUPRENORPHINE HCL-NALOXONE HCL 8-2 MG SL SUBL: 1.0000 | SUBLINGUAL_TABLET | Freq: Two times a day (BID) | SUBLINGUAL | 0 refills | Status: DC

## 2018-01-29 NOTE — Telephone Encounter (Signed)
Refill Request ° °buprenorphine-naloxone (SUBOXONE) 8-2 mg SUBL SL tablet ° °[facility] - [place], [place] - [address] °

## 2018-01-29 NOTE — Telephone Encounter (Signed)
Next appt scheduled 11/12 in OUD.

## 2018-01-29 NOTE — Telephone Encounter (Signed)
On last visit I sent the outpatient pharmacy 2 prescriptions, the last one could be filled tomorrow. Were they not able to keep that script on file?

## 2018-01-30 MED FILL — BUPRENORPHIN-NALOXON 8-2 MG: 8-2 | 14 days supply | Qty: 28 | Fill #0

## 2018-01-30 NOTE — Telephone Encounter (Signed)
Pt stated rx was sent to [facility] instead of [facility].  I called [facility] -stated he has 1 refill on file; they will get it ready for him.  I called pt back - informed of refill at [facility].  Called [facility] to cancel rx.

## 2018-02-13 ENCOUNTER — Ambulatory Visit (INDEPENDENT_AMBULATORY_CARE_PROVIDER_SITE_OTHER): Payer: Self-pay | Admitting: Internal Medicine

## 2018-02-13 ENCOUNTER — Other Ambulatory Visit: Payer: Self-pay

## 2018-02-13 VITALS — BP 149/103 | HR 77 | Temp 97.9°F | Ht 74.0 in | Wt 174.2 lb

## 2018-02-13 DIAGNOSIS — Z79899 Other long term (current) drug therapy: Secondary | ICD-10-CM

## 2018-02-13 DIAGNOSIS — F1120 Opioid dependence, uncomplicated: Secondary | ICD-10-CM

## 2018-02-13 DIAGNOSIS — I10 Essential (primary) hypertension: Secondary | ICD-10-CM

## 2018-02-13 MED ORDER — BUPRENORPHINE HCL-NALOXONE HCL 8-2 MG SL SUBL: 1.0000 | SUBLINGUAL_TABLET | Freq: Two times a day (BID) | SUBLINGUAL | 0 refills | Status: DC

## 2018-02-13 MED ORDER — CHLORTHALIDONE 25 MG PO TABS: 25.0000 mg | ORAL_TABLET | Freq: Every day | ORAL | 6 refills | Status: DC

## 2018-02-13 MED FILL — CHLORTHALIDONE 25 MG TABS: 25 | 30 days supply | Qty: 30 | Fill #0

## 2018-02-13 MED FILL — BUPRENORPHIN-NALOXON 8-2 MG: 8-2 | 14 days supply | Qty: 28 | Fill #0

## 2018-02-13 NOTE — Assessment & Plan Note (Signed)
He is doing very well.  Has successfully weaned down to twice a day dosing with no recurrence of cravings or withdrawal.  Home life is stable and he is working.   Plan Continue Suboxone 8-2mg  tablets BID U tox today [place] CSRS was checked and was appropriate.

## 2018-02-13 NOTE — Progress Notes (Signed)
   02/13/2018  Cody Rios presents for follow up of opioid use disorder I have reviewed the prior induction visit, follow up visits, and telephone encounters relevant to opiate use disorder (OUD) treatment.   Current daily dose: 8mg  of suboxone BID  Date of Induction: August 2018  Current follow up interval, in weeks: 4  The patient has been adherent with the buprenorphine for OUD contract.   Last UDS Result: Appropriate    HPI: Cody Rios is a 27yo man with OUD who has been successfully clean now for > 1 year on suboxone.  He has now weaned down to twice daily dosing.  He feels like his cravings and symptoms are under control.  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 His blood pressure was elevated initially and then again on recheck.  He has no headache, chest pain or blurry vision today.  He reports that his parents both have high blood pressure.  We discussed a low salt diet.    Exam:   Vitals:   02/13/18 0842  BP: (!) 161/101  Pulse: 75  Temp: 97.9 F (36.6 C)  TempSrc: Oral  SpO2: 99%  Weight: 174 lb 3.2 oz (79 kg)  Height: 6\' 2"  (1.88 m)    General: Awake, alert, no acute distress Pulm: Breathing comfortably Psych: Pleasant, conversant  Assessment/Plan:  See Problem Based Charting in the Encounters Tab     Inez Catalina, MD  02/13/2018  8:59 AM

## 2018-02-13 NOTE — Assessment & Plan Note (Signed)
His Blood pressure has been creeping upward since July of this year.  He has had 2 visits now with initial BP > 160 systolic and on recheck today systolic was 149. I  Discussed with him the risk of untreated HTN.  I think it would be reasonable to start treatment.   Plan Start chlorthalidone 25mg  daily - $4 at the [facility] Check BMET at next visit I discussed diuretics and possible side effects of thiazides and gave him information.  Last BMET over the summer showed a normal Na and K.

## 2018-02-13 NOTE — Patient Instructions (Signed)
Cody Rios - -  You are doing very well!  Continue with the 2 times a day dosing of buprenorphine.   For your elevated blood pressure, please start taking Chlorthalidone once a day.  This should be $4 at the [facility]. If it is more expensive, please call us and I will change the medication.   Thank you!  Come back in 1 month.   Chlorthalidone tablets What is this medicine? CHLORTHALIDONE (klor THAL i done) is a diuretic. It increases the amount of urine passed, which causes the body to lose salt and water. This medicine is used to treat high blood pressure and edema or water retention. This medicine may be used for other purposes; ask your health care provider or pharmacist if you have questions. COMMON BRAND NAME(S): Thalitone What should I tell my health care provider before I take this medicine? They need to know if you have any of these conditions: -asthma -diabetes -gout -kidney disease -liver disease -parathyroid disease -systemic lupus erythematosus (SLE) -taking cortisone, digoxin, lithium carbonate, or drugs for diabetes -an unusual or allergic reaction to chlorthalidone, sulfa drugs, other medicines, foods, dyes, or preservatives -pregnant or trying to get pregnant -breast-feeding How should I use this medicine? Take this medicine by mouth with a glass of water. Follow the directions on the prescription label. It is best to take your dose in the morning with food. Take your medicine at regular intervals. Do not take your medicine more often than directed. Do not stop taking except on your doctor's advice. Talk to your pediatrician regarding the use of this medicine in children. Special care may be needed. Overdosage: If you think you have taken too much of this medicine contact a poison control center or emergency room at once. NOTE: This medicine is only for you. Do not share this medicine with others. What if I miss a dose? If you miss a dose, take it as soon as you  can. If it is almost time for your next dose, take only that dose. Do not take double or extra doses. What may interact with this medicine? -barbiturate medicines for sleep or seizure control -digoxin -lithium -medicines for diabetes -norepinephrine -other medicines for high blood pressure -some pain medicines -steroid hormones like prednisone, cortisone, hydrocortisone, corticotropin -tubocurarine This list may not describe all possible interactions. Give your health care provider a list of all the medicines, herbs, non-prescription drugs, or dietary supplements you use. Also tell them if you smoke, drink alcohol, or use illegal drugs. Some items may interact with your medicine. What should I watch for while using this medicine? Visit your doctor or health care professional for regular check ups. Check your blood pressure as directed. Ask your doctor or health care professional what your blood pressure should be and when you should contact him or her. You may need to be on a special diet while taking this medicine. Ask your doctor. You may get drowsy or dizzy. Do not drive, use machinery, or do anything that needs mental alertness until you know how this medicine affects you. Do not stand or sit up quickly, especially if you are an older patient. This reduces the risk of dizzy or fainting spells. Alcohol may interfere with the effect of this medicine. Avoid alcoholic drinks. This medicine may affect your blood sugar level. If you have diabetes, check with your doctor or health care professional before changing the dose of your diabetic medicine. This medicine can make you more sensitive to the sun. Keep  out of the sun. If you cannot avoid being in the sun, wear protective clothing and use sunscreen. Do not use sun lamps or tanning beds/booths. What side effects may I notice from receiving this medicine? Side effects that you should report to your doctor or health care professional as soon as  possible: -allergic reactions like skin rash, itching or hives, swelling of the face, lips, or tongue -dark urine -dry mouth -excess thirst -fast, irregular heart rate -fever, chills -muscle pain, cramps, or spasm -nausea, vomiting -redness, blistering, peeling or loosening of the skin, including inside the mouth -tingling, pain or numbness in the hands or feet -unusually weak or tired -yellowing of the eyes or skin Side effects that usually do not require medical attention (report to your doctor or health care professional if they continue or are bothersome): -diarrhea or constipation -headache -impotence -loss of appetite -stomach upset This list may not describe all possible side effects. Call your doctor for medical advice about side effects. You may report side effects to FDA at 1-800-FDA-1088. Where should I keep my medicine? Keep out of the reach of children. Store at room temperature between 15 and 30 degrees C (59 and 86 degrees F). Keep container tightly closed. Throw away any unused medicine after the expiration date. NOTE: This sheet is a summary. It may not cover all possible information. If you have questions about this medicine, talk to your doctor, pharmacist, or health care provider.  2018 Elsevier/Gold Standard (2007-06-26 15:28:48)

## 2018-02-19 LAB — TOXASSURE SELECT,+ANTIDEPR,UR

## 2018-03-02 MED FILL — BUPRENORPHIN-NALOXON 8-2 MG: 8-2 | 14 days supply | Qty: 28 | Fill #0

## 2018-03-13 ENCOUNTER — Other Ambulatory Visit: Payer: Self-pay

## 2018-03-13 ENCOUNTER — Ambulatory Visit (INDEPENDENT_AMBULATORY_CARE_PROVIDER_SITE_OTHER): Payer: Self-pay | Admitting: Internal Medicine

## 2018-03-13 DIAGNOSIS — Z79899 Other long term (current) drug therapy: Secondary | ICD-10-CM

## 2018-03-13 DIAGNOSIS — I10 Essential (primary) hypertension: Secondary | ICD-10-CM

## 2018-03-13 DIAGNOSIS — F1120 Opioid dependence, uncomplicated: Secondary | ICD-10-CM

## 2018-03-13 MED ORDER — CHLORTHALIDONE 50 MG PO TABS: 50.0000 mg | ORAL_TABLET | Freq: Every day | ORAL | 5 refills | Status: DC

## 2018-03-13 MED ORDER — BUPRENORPHINE HCL-NALOXONE HCL 8-2 MG SL SUBL: 1.0000 | SUBLINGUAL_TABLET | Freq: Two times a day (BID) | SUBLINGUAL | 0 refills | Status: DC

## 2018-03-13 MED ORDER — BUPRENORPHINE HCL-NALOXONE HCL 8-2 MG SL SUBL: 1.0000 | SUBLINGUAL_TABLET | Freq: Two times a day (BID) | SUBLINGUAL | 0 refills | Status: AC

## 2018-03-13 MED FILL — CHLORTHALIDONE 50 MG TAB: 50 | 30 days supply | Qty: 30 | Fill #0

## 2018-03-13 NOTE — Assessment & Plan Note (Signed)
Doing well, continue suboxone 8-2mg  BID.  Provided 4 week supply, f/u in 4 weeks.

## 2018-03-13 NOTE — Assessment & Plan Note (Signed)
Diastolic BP still elevated.  Reinforced low salt diet.  Will increase chlorthalidone to 50mg  daily.  Plan to check BMP at next visit.

## 2018-03-13 NOTE — Progress Notes (Signed)
   03/13/2018  Cody Rios presents for follow up of opioid use disorder I have reviewed the prior induction visit, follow up visits, and telephone encounters relevant to opiate use disorder (OUD) treatment.   Current daily dose: 8mg  of suboxone BID  Date of Induction: August 2018  Current follow up interval, in weeks: 4  The patient has been adherent with the buprenorphine for OUD contract.   Last UDS Result: Appropriate    HPI: Cody Rios is a 27yo man with OUD who has been successfully clean now for > 1 year on suboxone.  He has now weaned down to twice daily dosing.   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 Taking chlorthalidone 25mg  daily for BP, no HA, blurry vision or other symptoms.  Cravings well controlled on BID dosing.  Exam:   Vitals:   03/13/18 0839 03/13/18 0914  BP: (!) 143/98 (!) 135/95  Pulse: 72 76  Temp: 98 F (36.7 Rios)   SpO2: 98% 98%  Weight: 173 lb (78.5 kg)   Height: 6\' 2"  (1.88 m)     General: Awake, alert, no acute distress, good eye contact Pulm: Breathing comfortably Psych: Pleasant, conversant  Assessment/Plan:  See Problem Based Charting in the Encounters Tab     Cody Rios, Cody Kovacevic C, DO  03/13/2018  9:17 AM

## 2018-03-16 MED FILL — BUPRENORPHIN-NALOXON 8-2 MG: 8-2 | 14 days supply | Qty: 28 | Fill #0

## 2018-04-02 MED FILL — BUPRENORPHIN-NALOXON 8-2 MG: 8-2 | 14 days supply | Qty: 28 | Fill #0

## 2018-04-17 ENCOUNTER — Ambulatory Visit (INDEPENDENT_AMBULATORY_CARE_PROVIDER_SITE_OTHER): Payer: Self-pay | Admitting: Internal Medicine

## 2018-04-17 VITALS — BP 141/98 | HR 102 | Temp 98.2°F | Wt 161.6 lb

## 2018-04-17 DIAGNOSIS — F1120 Opioid dependence, uncomplicated: Secondary | ICD-10-CM

## 2018-04-17 DIAGNOSIS — Z79899 Other long term (current) drug therapy: Secondary | ICD-10-CM

## 2018-04-17 DIAGNOSIS — I10 Essential (primary) hypertension: Secondary | ICD-10-CM

## 2018-04-17 MED ORDER — BUPRENORPHINE HCL-NALOXONE HCL 8-2 MG SL SUBL: 1.0000 | SUBLINGUAL_TABLET | Freq: Two times a day (BID) | SUBLINGUAL | 0 refills | Status: DC

## 2018-04-17 MED ORDER — CHLORTHALIDONE 50 MG PO TABS: 50.0000 mg | ORAL_TABLET | Freq: Every day | ORAL | 5 refills | Status: DC

## 2018-04-17 MED FILL — BUPRENORPHIN-NALOXON 8-2 MG: 8-2 | 14 days supply | Qty: 28 | Fill #0

## 2018-04-17 NOTE — Patient Instructions (Signed)
Cody Rios - -  You are doing very well.   Please start your chlorthalidone back today and get your potassium checked.   Please come back in 1 month.   Thank you!

## 2018-04-17 NOTE — Progress Notes (Signed)
   04/17/2018  Cody Rios presents for follow up of opioid use disorder I have reviewed the prior induction visit, follow up visits, and telephone encounters relevant to opiate use disorder (OUD) treatment.   Current daily dose: 8mg  suboxone BID, he reports taking only once per day some days  Date of Induction: August 2018  Current follow up interval, in weeks: 4 weeks  The patient has been adherent with the buprenorphine for OUD contract.   Last UDS Result: appropriate for [facility]  HPI: Cody Rios is a 28 year old man who has been on treatment for OUD for about 1.5 years now.  He has been stable for quite a while and is successfully weaning his buprenorphine.  He says about 1/3 of the days of the month he is able to take only 1 pill per day.  He is usually left with 7 - 9 pills at the end of the month.  We discussed weaning to 1.5 pills per day and he would like to do that next month when he is seen.  I think he will be successfully weaned to once a day dosing in 1-2 months.  Overall, doing very well.    Exam:   Vitals:   04/17/18 1041  BP: (!) 141/98  Pulse: (!) 102  Temp: 98.2 F (36.8 C)  TempSrc: Oral  SpO2: 97%  Weight: 161 lb 9.6 oz (73.3 kg)    General: Well developed, no acute distress Eyes: Anicteric sclerae, no injection Pulm: Breathing comfortably, no wheezing Psych: Pleasant, normal mood Skin: He has persistent rash on ankles since being on antibiotics, non itchy.  Unclear cause.    Assessment/Plan:  See Problem Based Charting in the Encounters Tab     Inez Catalina, MD  04/17/2018  10:52 AM

## 2018-04-18 LAB — BMP8+ANION GAP
Anion Gap: 24 mmol/L — ABNORMAL HIGH (ref 10.0–18.0)
BUN / CREAT RATIO: 22 — AB (ref 9–20)
BUN: 19 mg/dL (ref 6–20)
CALCIUM: 9.8 mg/dL (ref 8.7–10.2)
CO2: 25 mmol/L (ref 20–29)
Chloride: 89 mmol/L — ABNORMAL LOW (ref 96–106)
Creatinine, Ser: 0.87 mg/dL (ref 0.76–1.27)
GFR, EST AFRICAN AMERICAN: 135 mL/min/{1.73_m2} (ref >59)
GFR, EST NON AFRICAN AMERICAN: 117 mL/min/{1.73_m2} (ref >59)
Glucose: 89 mg/dL (ref 65–99)
POTASSIUM: 2.9 mmol/L — AB (ref 3.5–5.2)
Sodium: 138 mmol/L (ref 134–144)

## 2018-04-18 MED ORDER — LISINOPRIL 5 MG PO TABS: 5.0000 mg | ORAL_TABLET | Freq: Every day | ORAL | 6 refills | Status: DC

## 2018-04-18 MED ORDER — POTASSIUM CHLORIDE ER 10 MEQ PO TBCR: 40.0000 meq | EXTENDED_RELEASE_TABLET | Freq: Once | ORAL | 0 refills | Status: DC

## 2018-04-18 MED FILL — LISINOPRIL 5 MG TABLET: 5 | 30 days supply | Qty: 30 | Fill #0

## 2018-04-18 MED FILL — POTASSIUM CHLORIDE ER 10 ME: 10 | 1 days supply | Qty: 4 | Fill #0

## 2018-04-18 NOTE — Assessment & Plan Note (Signed)
He is doing very well on treatment.  He is able to take only 1 tablet a day about 1/3 of the time.  He is willing to decrease to 1.5 tablets per day in the next month.   Plan Continue Suboxone 8-2mg  BID for 1 more month then decrease to 1.5 tablets per day PDMP reviewed and appropriate UDS today Follow up in 4 weeks.

## 2018-04-18 NOTE — Assessment & Plan Note (Signed)
BP is modestly elevated today.  He has been out of chlorthalidone for 4 days.  He has not had a repeat BMET yet.  BMET today showed a K of 2.9.  I called and spoke with Amalia Hailey and advised him to get lisinopril 5mg  tablets instead and a one time dose of KCL 40 meq.  We will recheck a BMET at next visit.   Plan STOP chlorthalidone START lisinopril 5mg  daily KCL 40 meq X 1 Follow up in 4 weeks.

## 2018-04-23 LAB — TOXASSURE SELECT,+ANTIDEPR,UR

## 2018-04-26 ENCOUNTER — Telehealth: Payer: Self-pay | Admitting: *Deleted

## 2018-04-26 NOTE — Telephone Encounter (Signed)
Received fax from pt's pharmacy with the following message regarding both the lisinopril and potassium prescriptions:  "please resend with IM on rx"   Will send to ordering physician for review.Kingsley Spittle Cassady1/23/20208:53 AM

## 2018-04-27 MED ORDER — BUPRENORPHINE HCL-NALOXONE HCL 8-2 MG SL SUBL: 1.0000 | SUBLINGUAL_TABLET | Freq: Two times a day (BID) | SUBLINGUAL | 0 refills | Status: DC

## 2018-04-27 NOTE — Telephone Encounter (Signed)
I resent.    Previous RX said "IM Program" on there.  Hopefully it goes through this time.   Debe Coder, MD

## 2018-04-27 NOTE — Telephone Encounter (Signed)
See below

## 2018-05-09 MED FILL — BUPRENORPHIN-NALOXON 8-2 MG: 8-2 | 14 days supply | Qty: 28 | Fill #1

## 2018-05-29 ENCOUNTER — Other Ambulatory Visit: Payer: Self-pay

## 2018-05-29 ENCOUNTER — Ambulatory Visit (INDEPENDENT_AMBULATORY_CARE_PROVIDER_SITE_OTHER): Payer: Self-pay | Admitting: Internal Medicine

## 2018-05-29 VITALS — BP 141/88 | HR 98 | Temp 98.2°F | Wt 152.4 lb

## 2018-05-29 DIAGNOSIS — I10 Essential (primary) hypertension: Secondary | ICD-10-CM

## 2018-05-29 DIAGNOSIS — Z79899 Other long term (current) drug therapy: Secondary | ICD-10-CM

## 2018-05-29 DIAGNOSIS — F1120 Opioid dependence, uncomplicated: Secondary | ICD-10-CM

## 2018-05-29 MED ORDER — BUPRENORPHINE HCL-NALOXONE HCL 8-2 MG SL SUBL: SUBLINGUAL_TABLET | SUBLINGUAL | 0 refills | Status: DC

## 2018-05-29 MED ORDER — LISINOPRIL 10 MG PO TABS: 10.0000 mg | ORAL_TABLET | Freq: Every day | ORAL | 0 refills | Status: DC

## 2018-05-29 MED FILL — LISINOPRIL 10 MG TABS: 10 | 30 days supply | Qty: 30 | Fill #0

## 2018-05-29 MED FILL — BUPRENORPHIN-NALOXON 8-2 MG: 8-2 | 14 days supply | Qty: 21 | Fill #0

## 2018-05-29 NOTE — Patient Instructions (Addendum)
Please return in 4 weeks

## 2018-05-29 NOTE — Progress Notes (Signed)
   05/29/2018  [name] presents for follow up of opioid use disorder I have reviewed the prior induction visit, follow up visits, and telephone encounters relevant to opiate use disorder (OUD) treatment.   Current daily dose: 8mg  suboxone BID, He continues to take this medication as needed, see HPI  Date of Induction: August 2018  Current follow up interval, in weeks: 4 weeks  The patient has been adherent with the buprenorphine for OUD contract.   Last UDS Result: Appropriate for Buprenorphine   HPI: Cody Rios is a 28 year old male presents to clinic for continued treatment of OUD since August 2018.  He continues to endorse good medication adherence and denied withdraw symptoms. He feels that therapy is going well overall and denies acute complaints today.  Is been able to maintain good visitation with his daughter and is thankful for the Suboxone therapy and how this has benefited him.  He states that he typically utilizes 3 tablets every 2 days as he often forgets to take his afternoon dose having 12-18 tablets remaining per month that he disposes of.  He is concerned with the concept of decreasing his daily dose to 1 tablet daily as there are days where he requires a second dose.  He denies use of other substances.   Exam:   Vitals:   05/29/18 0926  BP: (!) 141/88  Pulse: 98  Temp: 98.2 F (36.8 C)  TempSrc: Oral  SpO2: 98%  Weight: 152 lb 6.4 oz (69.1 kg)   General: A/O x4, in no acute distress, afebrile, nondiaphoretic Cardio: RRR, no mrg's Pulmonary: CTA bilaterally MSK: BLE nontender, nonedematous Psych: Appropriate affect, not depressed in appearance, engages well  Assessment/Plan:  See Problem Based Charting in the Encounters Tab  Lanelle Bal, MD  05/29/2018  9:47 AM

## 2018-05-29 NOTE — Progress Notes (Addendum)
Internal Medicine Clinic Attending  I saw and evaluated the patient.  I personally confirmed the key portions of the history and exam documented by Dr. Crista Elliot and I reviewed pertinent patient test results.  The assessment, diagnosis, and plan were formulated together and I agree with the documentation in the resident's note.      Would consider having Cody Rios follow up in our clinic for management of his HTN.  Given his age, this would only need to be a yearly appointment once BP is under control.   PDMP reviewed and appropriate.

## 2018-05-29 NOTE — Assessment & Plan Note (Signed)
Stable, no acute concerns noted. Denied withdrawal. Appears to be doing well.   Continue suboxone decreased to 1.5 tablets daily Return in 4 weeks

## 2018-05-29 NOTE — Assessment & Plan Note (Addendum)
  Hypertension: Patient's BP today is 141/88 with a goal of <140/80. The patient endorses adherence to their medication regimen. He denied, chest pain, headache, visual changes, lightheadedness, weakness, dizziness on standing, swelling in the feet or ankles.   Plan: Continue lisinopril increased to 10mg  daily BMP today for electrolytes and renal function

## 2018-05-30 LAB — BMP8+ANION GAP
Anion Gap: 17 mmol/L (ref 10.0–18.0)
BUN/Creatinine Ratio: 15 (ref 9–20)
BUN: 11 mg/dL (ref 6–20)
CO2: 22 mmol/L (ref 20–29)
CREATININE: 0.74 mg/dL — AB (ref 0.76–1.27)
Calcium: 9.3 mg/dL (ref 8.7–10.2)
Chloride: 100 mmol/L (ref 96–106)
GFR calc Af Amer: 144 mL/min/{1.73_m2} (ref >59)
GFR calc non Af Amer: 125 mL/min/{1.73_m2} (ref >59)
Glucose: 83 mg/dL (ref 65–99)
Potassium: 3.7 mmol/L (ref 3.5–5.2)
Sodium: 139 mmol/L (ref 134–144)

## 2018-06-18 MED FILL — BUPRENORPHIN-NALOXON 8-2 MG: 8-2 | 14 days supply | Qty: 21 | Fill #1 | Status: TO

## 2018-06-25 ENCOUNTER — Telehealth: Payer: Self-pay | Admitting: Student in an Organized Health Care Education/Training Program

## 2018-06-25 MED ORDER — BUPRENORPHINE HCL-NALOXONE HCL 8-2 MG SL SUBL: SUBLINGUAL_TABLET | SUBLINGUAL | 0 refills | Status: DC

## 2018-06-25 NOTE — Telephone Encounter (Signed)
Spoke with Cody Rios over the phone today, we are converting or in person nonurgent visits to telephone visits during the COVID-19 restrictions.  Cody Rios had an acute event recently, his mother died last week of ischemic heart disease.  He reports doing okay, says he has a lot of family around him, they are planning for funeral services in the coming days.  He denies significant depression, and his grief is manageable right now.  Denies relapses of his opioid use disorder.  Reports good compliance with Suboxone 1.5 tablets/day.  Says cravings are well controlled.  He is not worried about relapse in right now.  We talked about a refill of his Suboxone.  [facility] pharmacy is going to close for a period of time due to restrictions, so I have sent a 1 month supply to the [facility] long pharmacy I warned him I am not sure what the cost will be, he will let me know if it is a barrier to filling the medication.  Asked him to call us and check in with [place] in about 1 month.  Erlinda Hong, MD

## 2018-07-28 MED FILL — BUPRENORPHIN-NALOXON 8-2 MG: 8-2 | 14 days supply | Qty: 21 | Fill #0

## 2018-08-01 ENCOUNTER — Telehealth: Payer: Self-pay | Admitting: Internal Medicine

## 2018-08-01 MED ORDER — BUPRENORPHINE HCL-NALOXONE HCL 8-2 MG SL SUBL: 1.0000 | SUBLINGUAL_TABLET | Freq: Two times a day (BID) | SUBLINGUAL | 0 refills | Status: DC

## 2018-08-01 NOTE — Telephone Encounter (Signed)
Called Cody Rios to check in on him.  Confirmed identity with DOB.   He reports that he has had one relapse, used only one time, family caught him and has been watching him closely.  The relapse was 2 weeks after his mother passed away.  She had a quadruple bypass and then did not do well after surgery and passed.  This has been an increased time of stress for him as he was also trying to wean off his suboxone at that time.  He is committed to staying off the heroin and has good family support.  We discussed grief counseling or possibly speaking to [facility], our counselor, but he declined at this time.  I advised him that we could set that up at any time.   He is requesting to increase to BID dosing while he goes through this challenging time, and I think this is appropriate given his recent relapse.    Of note, he had very pressured speech when I talked to him.  He has taken adderall from friends in the past.  I will continue to stress the need for him to only get medications from a medical provider.    Plan Refill buprenorphine 8-2mg  tablets BID, #60 In person visit when clinic is back up and running normally.    Cody Coder, MD

## 2018-08-02 MED FILL — BUPRENORPHIN-NALOXON 8-2 MG: 8-2 | 30 days supply | Qty: 60 | Fill #0

## 2018-08-02 NOTE — Telephone Encounter (Signed)
Thank you I took care of it.

## 2018-08-02 NOTE — Telephone Encounter (Signed)
Received fax from [facility] requesting clarification on suboxone rx. Sig: place 1 tablet under tongue 2 times daily. Take 1.5 tablets. Kinnie Feil, RN, BSN

## 2018-08-14 MED FILL — BUPRENORPHIN-NALOXON 8-2 MG: 8-2 | 14 days supply | Qty: 28 | Fill #0

## 2018-09-15 MED FILL — BUPRENORPHIN-NALOXON 8-2 MG: 8-2 | 14 days supply | Qty: 28 | Fill #1

## 2018-10-01 ENCOUNTER — Other Ambulatory Visit: Payer: Self-pay

## 2018-10-01 MED ORDER — BUPRENORPHINE HCL-NALOXONE HCL 8-2 MG SL SUBL: SUBLINGUAL_TABLET | SUBLINGUAL | 0 refills | Status: DC

## 2018-10-01 MED FILL — BUPRENORPHIN-NALOXON 8-2 MG: 8-2 | 14 days supply | Qty: 21 | Fill #0

## 2018-10-01 NOTE — Telephone Encounter (Signed)
Refilled 14 day supply

## 2018-10-01 NOTE — Telephone Encounter (Signed)
buprenorphine-naloxone (SUBOXONE) 8-2 mg SUBL SL tablet   Refill request @  [facility] - [place], [place] - [address] [phone] (Phone) [phone] (Fax)    

## 2018-10-09 ENCOUNTER — Ambulatory Visit (INDEPENDENT_AMBULATORY_CARE_PROVIDER_SITE_OTHER): Payer: Self-pay | Admitting: Student in an Organized Health Care Education/Training Program

## 2018-10-09 ENCOUNTER — Other Ambulatory Visit: Payer: Self-pay

## 2018-10-09 VITALS — BP 148/92 | HR 95 | Temp 98.1°F | Wt 165.3 lb

## 2018-10-09 DIAGNOSIS — F1120 Opioid dependence, uncomplicated: Secondary | ICD-10-CM

## 2018-10-09 DIAGNOSIS — Z8679 Personal history of other diseases of the circulatory system: Secondary | ICD-10-CM

## 2018-10-09 MED ORDER — BUPRENORPHINE HCL-NALOXONE HCL 8-2 MG SL SUBL: 1.0000 | SUBLINGUAL_TABLET | Freq: Two times a day (BID) | SUBLINGUAL | 0 refills | Status: DC

## 2018-10-09 NOTE — Patient Instructions (Signed)

## 2018-10-09 NOTE — Assessment & Plan Note (Signed)
Severe opioid use disorder, history of infectious endocarditis, has been in full relapse for the last 2 months.  Currently using heroin injection 3 times daily.  High risk for withdrawal.  Patient is motivated to get back onto treatment with Suboxone.  Plan is to re-start treatments with Suboxone 4 mg tonight, can repeat dose 2 hours later, then start 8 mg twice daily tomorrow.  Gave him resources on measuring withdrawal, and avoiding precipitated withdrawal.  Patient is able to do this at home.  He was using fresh needles, no reusing, no sharing, so low risk for infectious symptoms.  No other signs of infectious complications on exam.  No need for tox screen today given active relapse, but will check at next visit.

## 2018-10-09 NOTE — Progress Notes (Signed)
   10/09/2018  Cody Rios presents for follow up of opioid use disorder I have reviewed the prior induction visit, follow up visits, and telephone encounters relevant to opiate use disorder (OUD) treatment.     HPI: 28 year old man here for follow-up of severe opioid use disorder.  3 months ago his mother passed away from complications of ischemic heart disease.  This was a lot of stress for the patient's.  He reports relapsing around that time.  Stopped using Suboxone completely, currently injecting heroin about 3 times daily.  He will feel withdrawal if he does not use throughout the day.  Denies any fevers or chills.  Went to needle exchange and was able to get 200 fresh needles.  Has not been reusing or sharing.  No overdoses.  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  He wants to restart Suboxone therapy.  He knows that this is not sustainable.  He is very scared of having another infectious endocarditis complication.  He tried going back on Suboxone several times over the last couple months but kept putting himself into precipitated withdrawal.   Exam:   Vitals:   10/09/18 0909  BP: (!) 148/92  Pulse: 95  Temp: 98.1 F (36.7 C)  TempSrc: Oral  SpO2: 99%  Weight: 165 lb 4.8 oz (75 kg)    General: Well-appearing young man no distress Heart is regular rate and rhythm, no murmurs, no lower extremity edema Skin is normal, there are some track marks on his left arm from recent injection, but there are no skin or soft tissue infections, no abscess Psych is normal, normal affect, not depressed or anxious appearing  Assessment/Plan:  See Problem Based Charting in the Encounters Tab     Axel Filler, MD  10/09/2018  9:30 AM

## 2018-10-29 MED FILL — BUPRENORPHIN-NALOXON 8-2 MG: 8-2 | 14 days supply | Qty: 28 | Fill #0

## 2018-12-12 ENCOUNTER — Telehealth: Payer: Self-pay

## 2018-12-12 NOTE — Telephone Encounter (Signed)
Yes, thank you.

## 2018-12-12 NOTE — Telephone Encounter (Signed)
Pt would like to get back in the [facility]; pls contact [phone]

## 2018-12-12 NOTE — Telephone Encounter (Signed)
Called pt, he states he is going to talk to his father about inpt treatment, states he is starting to have fluid in his legs. Nurse stressed he needs to make the decision soon and he is welcome to come back to clinic but that he needed to be honest with self and decide what would help the most at this point and what he can be most compliant with. Stressed the doctors and staff were behind him and will help. He appreciates this and will call back within 2 days to give his decision after speaking with family.

## 2018-12-12 NOTE — Telephone Encounter (Signed)
May I give him an appt?

## 2019-01-21 ENCOUNTER — Telehealth: Payer: Self-pay | Admitting: Student in an Organized Health Care Education/Training Program

## 2019-01-21 NOTE — Telephone Encounter (Signed)
rtc to pt, made appt OUD 10/27, he states he has got to change what's been going on and is ready to recommit.

## 2019-01-21 NOTE — Telephone Encounter (Signed)
Pt is requesting a phone call back from [place].

## 2019-01-29 ENCOUNTER — Ambulatory Visit (INDEPENDENT_AMBULATORY_CARE_PROVIDER_SITE_OTHER): Payer: Self-pay | Admitting: Student in an Organized Health Care Education/Training Program

## 2019-01-29 ENCOUNTER — Other Ambulatory Visit: Payer: Self-pay

## 2019-01-29 VITALS — BP 132/78 | HR 81 | Temp 98.4°F | Wt 178.4 lb

## 2019-01-29 DIAGNOSIS — F1120 Opioid dependence, uncomplicated: Secondary | ICD-10-CM

## 2019-01-29 MED ORDER — BUPRENORPHINE HCL-NALOXONE HCL 8-2 MG SL SUBL: 1.0000 | SUBLINGUAL_TABLET | Freq: Two times a day (BID) | SUBLINGUAL | 0 refills | Status: DC

## 2019-01-29 MED FILL — BUPRENORPHIN-NALOXON 8-2 MG: 8-2 | 7 days supply | Qty: 14 | Fill #0

## 2019-01-29 NOTE — Progress Notes (Signed)
   Assessment and Plan:  See Encounters tab for problem-based medical decision making.   __________________________________________________________  HPI:   28 year old man here for treatment of opioid use disorder.  Patient has had on and off treatments over the last few years.  He was stable on treatment with Suboxone for about 2 years then relapsed earlier in 2020.  We tried to restart treatment in July he had 1 visits, however he had trouble with the induction.  He felt some precipitated withdrawal, and then quickly went back to daily heroin use.  Currently he is injecting about 1 g of heroin per day, injects 4-5 times per day.  Last injected this morning.  Currently unemployed, he lost his job.  His mother passed away earlier this year.  He has a 27-year-old daughter whom he does not see anymore.  He has a father who lives in [place], he has been supportive and helps him with some work every once in a while.  Patient does not have a car or transportation.  He stays at a place in [place], says that it is a safe environment.  He says that he wants to get back on treatment because he feels like his life is out of control right now.  He understands that he is at risk for overdose and death.  Also at risk for another episode of endocarditis which could result in his death.  He wants to be around for his daughter and a bigger part of her life.  He reports reusing needles, denies sharing needles.  Denies fevers or chills.  No chest pain or shortness of breath.  __________________________________________________________  Problem List: Patient Active Problem List   Diagnosis Date Noted  . Opioid use disorder, severe, on maintenance therapy ([place]) 11/22/2016    Priority: High  . Hypertension 01/16/2018    Medications: Reconciled today in Epic __________________________________________________________  Physical Exam:  Vital Signs: Vitals:   01/29/19 1044  BP: 132/78  Pulse: 81  Temp: 98.4  F (36.9 C)  TempSrc: Oral  SpO2: 98%  Weight: 178 lb 6.4 oz (80.9 kg)    Gen: Well appearing, NAD Neck: No cervical LAD, No thyromegaly or nodules, No JVD. CV: RRR, no murmurs Pulm: Normal effort, CTA throughout, no wheezing Ext: Warm, no edema, normal joints Skin: No atypical appearing moles. No rashes, injection marks on bilateral arms but no redness, no purulence, no soft tissue infection

## 2019-01-29 NOTE — Assessment & Plan Note (Signed)
Severe opioid use disorder, currently uncontrolled.  Last treatment attempt was July 2020.  I think he is a good candidate for another trial of treatment with Suboxone.  He has some good social support.  At the least this could be risk reduction model.  Plan is to start Suboxone 4 mg tonight.  Last heroin use was this morning.  We talked about monitoring his withdrawal symptoms, he understands the process for preventing precipitated withdrawal when starting Suboxone.  Plan is to get to 8 mg twice daily.  Prescribed a 1 week supply, follow-up with [place] in 1 week.  Because he has been injecting were going to recheck an HIV and hepatitis C status.  He has previously been treated for hepatitis C and so we will need to check a hepatitis C genotype as we anticipate his antibody will still be positive.

## 2019-01-30 LAB — HIV ANTIBODY (ROUTINE TESTING W REFLEX): HIV Screen 4th Generation wRfx: NONREACTIVE

## 2019-01-30 LAB — HCV RNA QUANT: Hepatitis C Quantitation: NOT DETECTED IU/mL

## 2019-02-05 ENCOUNTER — Ambulatory Visit (INDEPENDENT_AMBULATORY_CARE_PROVIDER_SITE_OTHER): Payer: Self-pay | Admitting: Internal Medicine

## 2019-02-05 ENCOUNTER — Encounter: Payer: Self-pay | Admitting: Internal Medicine

## 2019-02-05 ENCOUNTER — Other Ambulatory Visit: Payer: Self-pay

## 2019-02-05 VITALS — BP 147/87 | HR 87 | Temp 98.7°F | Wt 179.4 lb

## 2019-02-05 DIAGNOSIS — R600 Localized edema: Secondary | ICD-10-CM | POA: Insufficient documentation

## 2019-02-05 DIAGNOSIS — F1120 Opioid dependence, uncomplicated: Secondary | ICD-10-CM

## 2019-02-05 DIAGNOSIS — I10 Essential (primary) hypertension: Secondary | ICD-10-CM

## 2019-02-05 MED ORDER — LISINOPRIL 10 MG PO TABS: 10.0000 mg | ORAL_TABLET | Freq: Every day | ORAL | 0 refills | Status: DC

## 2019-02-05 MED FILL — LISINOPRIL 10 MG TABS: 10 | 30 days supply | Qty: 30 | Fill #0

## 2019-02-05 NOTE — Progress Notes (Signed)
   02/05/2019  Ahmari A Turbin presents for follow up of opioid use disorder I have reviewed the prior induction visit, follow up visits, and telephone encounters relevant to opiate use disorder (OUD) treatment.   Current daily dose: Suboxone 8-2 BID  Date of Induction: 02/05/2019  Current follow up interval, in weeks: 1 week  The patient has not started buprenorphine yet that was prescribed last week for OUD contract.   Last UDS Result was 11 months ago for prior contract and was positive for Buprenorphine, Methamphetamine.  HPI: Mr. Vicencio here for treatment of opioid use disorder. He injects Heroin few times a day. His last use was this morning. He has not started Suboxone yet because he just picked them up yesterday. He endorses that he takes unprescribed Adderall because to help with his concentration issues. She denies depression or anxiety. He lives with his father who does not use drugs but drinks alcohol some times. His father trying to help him to go quit drugs. He mentions that he is going to go out of town for few days to have less distraction when trying to stop using Heroin and with deal with withdrawal symptoms.   Exam:   Vitals:   02/05/19 1113  BP: (!) 147/87  Pulse: 87  Temp: 98.7 F (37.1 C)  TempSrc: Oral   Physical Exam  Constitutional: Pleasant young man, well-developed and well-nourished. No acute distress.  Eyes: Conjunctivae are normal Cardiovascular: RRR, nl S1S2, no murmur, bilateral non pitting edema LEE Respiratory: No respiratory distress. No wheezes.  Neurological: Is alert and oriented x 3  Skin: Not diaphoretic. Injection sites on arms, no erythema, swelling or draignage Psychiatric: Normal mood and affect. Behavior is normal. Judgment and thought content normal.   Assessment/Plan:  See Problem Based Charting in the Encounters Tab  Dewayne Hatch, MD  02/05/2019  11:14 AM

## 2019-02-05 NOTE — Assessment & Plan Note (Signed)
BP today is 147/87. He mentions that he has not taken Lisinopril for several months and waiting for getting insurance and get PCP then.  -Sent refill for Lisinopril 10 mg QD -Monitor BP  -BMP in 2 weeks

## 2019-02-05 NOTE — Assessment & Plan Note (Addendum)
patient has bilateral non pitting edema above his ankles. No other evidence of volume overload.  Given Hx of TR, HTN, also IV drug use, will monitor him and will consider repeating echo for heart failure and worsening of valvular disease if concerning.

## 2019-02-05 NOTE — Patient Instructions (Signed)
It was our pleasure taking care of you in our clinic today.  Please start taking Suboxone and follow up with [place] in 1 week. Please let us know if you have any questions or concern or any issue with the process of quitting drugs.  I send refill for your blood pressure medications. Please take it once daily.  Thank you

## 2019-02-05 NOTE — Assessment & Plan Note (Addendum)
Last Herin use was this AM. We will not do ToxAssure today given he has not started Suboxone yet.    -Start Suboxone today -Come back to clinic in 1 week -Send referral to Ms. Miquel Dunn.

## 2019-02-06 ENCOUNTER — Telehealth: Payer: Self-pay | Admitting: Licensed Clinical Social Worker

## 2019-02-06 ENCOUNTER — Encounter: Payer: Self-pay | Admitting: Licensed Clinical Social Worker

## 2019-02-06 NOTE — Telephone Encounter (Signed)
Patient was contacted (1st attempt) to discuss his referral to his doctor. Patient did not answer, and no vm was available. A letter will also be mailed today.

## 2019-02-08 NOTE — Progress Notes (Signed)
Internal Medicine Clinic Attending  I saw and evaluated the patient.  I personally confirmed the key portions of the history and exam documented by Dr. Masoudi and I reviewed pertinent patient test results.  The assessment, diagnosis, and plan were formulated together and I agree with the documentation in the resident's note. 

## 2019-02-12 ENCOUNTER — Ambulatory Visit (INDEPENDENT_AMBULATORY_CARE_PROVIDER_SITE_OTHER): Payer: Self-pay | Admitting: Internal Medicine

## 2019-02-12 ENCOUNTER — Other Ambulatory Visit: Payer: Self-pay

## 2019-02-12 VITALS — BP 140/80 | HR 98 | Temp 97.5°F | Wt 173.0 lb

## 2019-02-12 DIAGNOSIS — F1120 Opioid dependence, uncomplicated: Secondary | ICD-10-CM

## 2019-02-12 NOTE — Progress Notes (Signed)
   02/12/2019  Cody Rios presents for follow up of opioid use disorder I have reviewed the prior induction visit, follow up visits, and telephone encounters relevant to opiate use disorder (OUD) treatment.   Current daily dose: Suboxone 8-2 BID  Date of Induction: 02/05/2019  Current follow up interval, in weeks: 2 weeks  The patient has not been fully adherent with the buprenorphine for OUD contract due to withdrawal symptoms.  Last UDS Result:11 months ago for prior contract and was positive for Buprenorphine, Methamphetamine.   HPI: Cody Rios here for treatment of opioid use disorder. He was started on Suboxone 8-2 mg BID but took only 5 pill since last visit because he developed withdrawal symptoms. He states that he had to use Heroin because his symptoms were sever. He can not describe his symptoms but he states that he became agitated and anxious. No other associated symptoms. He believes that he waited enough between last dose of heroin and Suboxone and took Suboxone only after his withdrawal symptoms began but it did not help and his symptoms got worse. Please refer to problem based charting for further detail and assessment and plan.   Exam:   Vitals:   02/12/19 0927  BP: 140/80  Pulse: 98  Temp: (!) 97.5 F (36.4 C)  TempSrc: Oral  SpO2: 97%  Weight: 173 lb (78.5 kg)    Physical Exam  Constitutional: He appears well-developed and well-nourished. No distress.  HENT:  Head: Normocephalic.  Eyes: Conjunctivae are normal.  Cardiovascular: Normal rate, regular rhythm and normal heart sounds.  Respiratory: Effort normal and breath sounds normal. No respiratory distress. He has no wheezes. He has no rales.  GI: He exhibits no distension.  Musculoskeletal:        General: No tenderness or edema.     Comments: Mild ankles swelling  Skin: No rash noted.  Injection mark on arms. No erythema, swelling, or warmth  Psychiatric: He has a normal mood and affect.  His behavior is normal. Judgment and thought content normal.    Assessment/Plan:  See Problem Based Charting in the Encounters Tab     Cody Hatch, MD  02/12/2019  5:36 PM

## 2019-02-12 NOTE — Assessment & Plan Note (Addendum)
Patient came back to clinic today for follow up. He started the Suboxone but unfortunately was not able to stop heroin injection because of withdrawal symptoms. He took fisrt dose of Suboxone last week, 10-12 h after last heroin injection. He mentions that his withdrawal got worse after taking Suboxone so he had to inject heroin. Since then, he used heroin almost every day and meanwhile he took some Suboxone as well.  Talking to him more, it revealed that he swallowed the Suboxone pill instead of sublingual use. He endorses that he did the same thing when he was on Suboxone for a year before (during previous contract.) He mentions that PO Suboxone always worked for him and he did not know he should take it sublingually. I explained that, it should be taken sublingually to work.  Complete information and instruction regarding how to use Suboxone as well as details about withdrawal symptoms provided. Instruction reviewed with patient again. He verbalizes understanding and is agreeable to call clinic if he had any question or issue.   -Suboxone 8-2 mg BID sublingual -F/u in clinic in 2 weeks -Patient has 8 suboxone pill left at home. He instructed to call us for refill before finishing that

## 2019-02-12 NOTE — Patient Instructions (Addendum)
   Instruction for starting buprenorphine-naloxone (Suboxone) at home  You should not mix buprenorphine-naloxone with other drugs especially large amounts of alcohol or benzodiazepines (Valium, Klonopin, Xanax, Ativan). If you have taken any of these medication, please tell your healthcare team and do not take buprenorphine-naloxone.   You must wait until you are feeling signs of withdrawal from opiates (heroin, pain pills) before you take buprenorphine-naloxone.  If you do not wait long enough the medication will make you sicker.  If you do take it too soon and get sicker then wait until later when you feel signs of withdrawal listed below and then try again.   Signs that you are withdrawing: ? Anxiety, restlessness, can't sit still ? Aches ? Nausea or sick to your stomach ? Goose-bumps ? Racing heart   You should have ALL of these symptoms before you start taking your first dose of buprenorphine-naloxone. If you are not sure call your healthcare team.    When it's time to take your first dose 1. Split your pill or film in half 2. Make sure your mouth is empty of everything (no candy/gum/etc) 3. Sit or stand, but do not lie down 4. Swallow a sip of water to wet your mouth  5. Put the half of the tablet or film under your tongue. Do not suck or swallow it. It must stay there until it is completely dissolved. Try to not even swallow your spit during this time. Anything that you swallow will not make you feel better.   In 20 minutes: You should start feeling a little better. If you feel worse then you started too early so you would wait a few hours and then try again later.    In one hour: You can take the other half of the pill or film the same way you took the first one.   In 2 hours: if you are still feeling symptoms of withdrawal listed above you can take another half a pill or film. You can repeat this if needed until you take a total of 2 pills or 2 films (16mg ). You may need less than  this to control your symptoms.  You should adjust your dose so that you are taking one and half or two pills or films per day (12-16mg  per day). At this dose you should have cut down on cravings and help with any withdrawal symptoms.   The next day:  In the morning you can take the same amount you took yesterday all at one time in the morning.  Expect a call from your team to see how you are doing.   If you have any questions or concerns at any time call your healthcare team.  Clinic Number:  [place]: [phone] After Hours Number: [phone] - - Leave your number and expect a call back from a physician.   Please come back to clinic in 1 week. Meanwhile, give [place] a call when you run out of Suboxone so we send you a refill if appropriate. Thank you

## 2019-02-13 NOTE — Progress Notes (Signed)
Internal Medicine Clinic Attending  I saw and evaluated the patient.  I personally confirmed the key portions of the history and exam documented by Dr. Masoudi and I reviewed pertinent patient test results.  The assessment, diagnosis, and plan were formulated together and I agree with the documentation in the resident's note. 

## 2019-03-04 NOTE — Addendum Note (Signed)
Addended by: Hulan Fray on: 03/04/2019 02:18 PM   Modules accepted: Orders

## 2019-04-05 ENCOUNTER — Encounter (HOSPITAL_COMMUNITY): Payer: Self-pay | Admitting: Emergency Medicine

## 2019-04-05 ENCOUNTER — Emergency Department (HOSPITAL_COMMUNITY)
Admission: EM | Admit: 2019-04-05 | Discharge: 2019-04-05 | Disposition: A | Discharge status: Home / Self Care | Payer: 59 | Attending: Emergency Medicine | Admitting: Emergency Medicine

## 2019-04-05 ENCOUNTER — Emergency Department (HOSPITAL_COMMUNITY): Payer: 59

## 2019-04-05 DIAGNOSIS — W19XXXA Unspecified fall, initial encounter: Secondary | ICD-10-CM | POA: Insufficient documentation

## 2019-04-05 DIAGNOSIS — S01521A Laceration with foreign body of lip, initial encounter: Secondary | ICD-10-CM | POA: Diagnosis not present

## 2019-04-05 DIAGNOSIS — Y999 Unspecified external cause status: Secondary | ICD-10-CM | POA: Insufficient documentation

## 2019-04-05 DIAGNOSIS — Y9389 Activity, other specified: Secondary | ICD-10-CM | POA: Diagnosis not present

## 2019-04-05 DIAGNOSIS — S02670A Fracture of alveolus of mandible, unspecified side, initial encounter for closed fracture: Secondary | ICD-10-CM

## 2019-04-05 DIAGNOSIS — I10 Essential (primary) hypertension: Secondary | ICD-10-CM | POA: Insufficient documentation

## 2019-04-05 DIAGNOSIS — Z1889 Other specified retained foreign body fragments: Secondary | ICD-10-CM | POA: Insufficient documentation

## 2019-04-05 DIAGNOSIS — Y929 Unspecified place or not applicable: Secondary | ICD-10-CM | POA: Insufficient documentation

## 2019-04-05 DIAGNOSIS — S0993XA Unspecified injury of face, initial encounter: Secondary | ICD-10-CM | POA: Diagnosis present

## 2019-04-05 DIAGNOSIS — Z23 Encounter for immunization: Secondary | ICD-10-CM | POA: Diagnosis not present

## 2019-04-05 DIAGNOSIS — S02672A Fracture of alveolus of left mandible, initial encounter for closed fracture: Secondary | ICD-10-CM | POA: Insufficient documentation

## 2019-04-05 DIAGNOSIS — F17220 Nicotine dependence, chewing tobacco, uncomplicated: Secondary | ICD-10-CM | POA: Diagnosis not present

## 2019-04-05 DIAGNOSIS — S0181XA Laceration without foreign body of other part of head, initial encounter: Secondary | ICD-10-CM | POA: Diagnosis not present

## 2019-04-05 DIAGNOSIS — K0381 Cracked tooth: Secondary | ICD-10-CM | POA: Diagnosis not present

## 2019-04-05 DIAGNOSIS — S01511A Laceration without foreign body of lip, initial encounter: Secondary | ICD-10-CM

## 2019-04-05 MED ORDER — TETANUS-DIPHTH-ACELL PERTUSSIS 5-2.5-18.5 LF-MCG/0.5 IM SUSP
0.5000 mL | Freq: Once | INTRAMUSCULAR | Status: AC
  Administered 2019-04-05: 0.5 mL via INTRAMUSCULAR
  Filled 2019-04-05: qty 0.5

## 2019-04-05 MED ORDER — LIDOCAINE HCL (PF) 1 % IJ SOLN
5.0000 mL | Freq: Once | INTRAMUSCULAR | Status: AC
  Administered 2019-04-05: 5 mL
  Filled 2019-04-05: qty 5

## 2019-04-05 MED ORDER — CLINDAMYCIN HCL 150 MG PO CAPS: 450.0000 mg | ORAL_CAPSULE | Freq: Three times a day (TID) | ORAL | 0 refills | Status: AC

## 2019-04-05 MED ORDER — BENZOCAINE 20 % MT AERO
INHALATION_SPRAY | Freq: Once | OROMUCOSAL | Status: AC
  Filled 2019-04-05: qty 57

## 2019-04-05 MED ORDER — CLINDAMYCIN HCL 150 MG PO CAPS: 450.0000 mg | ORAL_CAPSULE | Freq: Three times a day (TID) | ORAL | 0 refills | Status: DC

## 2019-04-05 NOTE — ED Notes (Signed)
Patient verbalizes understanding of discharge instructions. Opportunity for questioning and answers were provided. Armband removed by staff, pt discharged from ED. Ambulated out to lobby  

## 2019-04-05 NOTE — Discharge Instructions (Signed)
You were seen in the ER for facial injuries after fall  CT showed a small fracture at the bottom of your front mandible  No need for emergent repair  External lacerations were repaired today. These sutures need to be removed in the last 7 days.   Take antibiotics as prescribed to prevent infection around the fracture and lacerations  Alternate ibuprofen and acetaminophen for pain. Can use benzocaine spray topically right before eating/drinking to help with temporary pain relief. Ice. Soft liquid/pureed diet until you are evaluated by oral surgeon  Return for worsening pain swelling redness pus fever or other signs of infection

## 2019-04-05 NOTE — ED Provider Notes (Signed)
[facility] Provider [facility]   CSN: [id number] Arrival date & time: 04/05/19  [id number]     History Chief Complaint  Patient presents with  . Mouth Injury    Cody Rios is a 29 y.o. male with history of opioid abuse presents to the ER for evaluation of facial injury that occurred approximately 1 hour prior to arrival.  He has lacerations on his chin, lip and dental injuries from this.  States several hours prior to the incident he used heroin and had not slept for a while.  Remembers standing up and then finally asleep while standing up falling down hitting his chin on the nearby counter.  Denies loss of consciousness, severe headache.  No nausea, vomiting, visual disturbances or seizures.  Reports severe facial and chin pain and he is concerned about some missing cracked teeth.  Interventions.  Unknown tetanus status.  No anticoagulants.  Denies any other physical injuries specifically head injury, headache, neck pain, chest pain, abdominal pain.  Denies any other illicit drug use.  HPI     Past Medical History:  Diagnosis Date  . 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 Hepatitis C infection 11/22/2016   Treated 2019  . MSSA bacteremia 11/10/2016  . Opioid use disorder, severe, on maintenance therapy (HCC) 11/22/2016  . Severe tricuspid regurgitation 11/10/2016    Patient Active Problem List   Diagnosis Date Noted  . Bilateral lower extremity edema 02/05/2019  . Hypertension 01/16/2018  . Opioid use disorder, severe, on maintenance therapy (HCC) 11/22/2016    Past Surgical History:  Procedure Laterality Date  . HERNIA REPAIR    . KNEE SURGERY    . TEE WITHOUT CARDIOVERSION N/A 11/14/2016   Procedure: TRANSESOPHAGEAL ECHOCARDIOGRAM (TEE);  Surgeon: Elease HashimotoNahser, Deloris PingPhilip J, MD;  [facility];  Service: Cardiovascular;  Laterality: N/A;       Family History  Problem Relation Age of Onset  . Hypertension Father     Social  History   Tobacco Use  . Smoking status: Never Smoker  . Smokeless tobacco: Current User    Types: Chew  Substance Use Topics  . Alcohol use: No  . Drug use: No    Types: IV, Heroin    Comment: Last used: 11/09/16    Home Medications Prior to Admission medications   Medication Sig Start Date End Date Taking? Authorizing Provider  buprenorphine-naloxone (SUBOXONE) 8-2 mg SUBL SL tablet Place 1 tablet under the tongue 2 (two) times daily. Patient not taking: Reported on 04/05/2019 01/29/19   [name], Duncan Thomas, MD  clindamycin (CLEOCIN) 150 MG capsule Take 3 capsules (450 mg total) by mouth 3 (three) times daily for 10 days. 04/05/19 04/15/19  Liberty HandyGibbons, Kashmir Lysaght J, PA-C  lisinopril (ZESTRIL) 10 MG tablet Take 1 tablet (10 mg total) by mouth daily. Patient not taking: Reported on 04/05/2019 02/05/19   Masoudi, Shawna OrleansElhamalsadat, MD  potassium chloride (K-DUR) 10 MEQ tablet Take 4 tablets (40 mEq total) by mouth once for 1 dose. Patient not taking: Reported on 04/05/2019 04/18/18 04/18/18  Inez CatalinaMullen, Emily B, MD    Allergies    Cephalosporins and Penicillins  Review of Systems   Review of Systems  HENT: Positive for dental problem and facial swelling.   Skin: Positive for wound.  All other systems reviewed and are negative.   Physical Exam Updated Vital Signs BP (!) 156/100 (BP Location: Right Arm)   Pulse 90   Resp 16   SpO2 99%   Physical Exam  Vitals and nursing note reviewed.  Constitutional:      General: He is not in acute distress.    Appearance: He is well-developed.     Comments: NAD.  HENT:     Head: Normocephalic.     Comments: Focal tenderness along chin around laceration. No other mandibular tenderness. Full ROM of mandible. No tenderness of maxilla, nasal, periorbital or frontal bones. No scalp tenderness or sighs of trauma    Right Ear: External ear normal.     Left Ear: External ear normal.     Nose: Nose normal.     Mouth/Throat:      Comments: 2 lacerations 3 cm  straight through and through laceration right below vermillion border of lower lip 5 cm laceration on chin, hemostatic Tooth #9 is tender with percussion and subtle linear crack noted  Tooth #8 non tender, chip noted on bottom of tooth No gingival tenderness or abnormalities at the bases of teeth #8 and 9  Diffuse tenderness along teeth #22-25, these teeth appear to be pushed posteriorly.  Linear laceration at gingiva of tooth #25 No intraoral injury Eyes:     General: No scleral icterus.    Conjunctiva/sclera: Conjunctivae normal.  Neck:     Comments: No midline or paraspinal muscle tenderness Cardiovascular:     Rate and Rhythm: Normal rate and regular rhythm.     Heart sounds: Normal heart sounds. No murmur.  Pulmonary:     Effort: Pulmonary effort is normal.     Breath sounds: Normal breath sounds. No wheezing.  Musculoskeletal:        General: No deformity. Normal range of motion.     Cervical back: Normal range of motion and neck supple.  Skin:    General: Skin is warm and dry.     Capillary Refill: Capillary refill takes less than 2 seconds.  Neurological:     Mental Status: He is alert and oriented to person, place, and time.  Psychiatric:        Behavior: Behavior normal.        Thought Content: Thought content normal.        Judgment: Judgment normal.     ED Results / Procedures / Treatments   Labs (all labs ordered are listed, but only abnormal results are displayed) Labs Reviewed - No data to display  EKG None  Radiology CT Head Wo Contrast  Result Date: 04/05/2019 CLINICAL DATA:  Facial and mandibular lacerations after fall EXAM: CT HEAD WITHOUT CONTRAST CT MAXILLOFACIAL WITHOUT CONTRAST CT CERVICAL SPINE WITHOUT CONTRAST TECHNIQUE: Multidetector CT imaging of the head, cervical spine, and maxillofacial structures were performed using the standard protocol without intravenous contrast. Multiplanar CT image reconstructions of the cervical spine and maxillofacial  structures were also generated. COMPARISON:  None. FINDINGS: CT HEAD FINDINGS Brain: No evidence of acute infarction, hemorrhage, hydrocephalus, extra-axial collection or mass lesion/mass effect. Vascular: No hyperdense vessel or unexpected calcification. Skull: Normal. Negative for fracture or focal lesion. Other: None. CT MAXILLOFACIAL FINDINGS Osseous: There is subtle irregularity of the anterior alveolar process (series 8, image 53) anterior to the central incisors concerning for fracture. Mandible is otherwise intact. Temporomandibular joints are aligned. Bony orbital walls intact. Negative for nasal bone fracture. Orbits: Negative. No traumatic or inflammatory finding. Sinuses: Clear. Soft tissues: Multiple 3-4 mm irregular radiodensities within the soft tissues of the lower lip anteriorly (series 4, images 63-68) suspicious for foreign bodies. Soft tissue swelling over the anterior mandible/lower left. CT CERVICAL SPINE FINDINGS Alignment:  Straightening of the cervical lordosis. No facet joint dislocation. Dens and lateral masses aligned. Skull base and vertebrae: No acute fracture. No primary bone lesion or focal pathologic process. Soft tissues and spinal canal: No prevertebral fluid or swelling. No visible canal hematoma. Disc levels: No significant degenerative findings. Intervertebral disc spaces are well preserved. Upper chest: Minimal linear scarring in the left lung apex. Other: None. IMPRESSION: 1. No acute intracranial abnormality. 2. Subtle irregularity of the anterior alveolar process of the lower lip anterior to the central incisors concerning for fracture. 3. Soft tissue swelling over the anterior mandible/lower left. Multiple 3-4 mm irregular radiodensities within the soft tissues of the lower lip anteriorly suspicious for foreign bodies. 4. No evidence for acute traumatic injury to the cervical spine. Electronically Signed   By: Duanne Guess D.O.   On: 04/05/2019 12:58   CT Cervical  Spine Wo Contrast  Result Date: 04/05/2019 CLINICAL DATA:  Facial and mandibular lacerations after fall EXAM: CT HEAD WITHOUT CONTRAST CT MAXILLOFACIAL WITHOUT CONTRAST CT CERVICAL SPINE WITHOUT CONTRAST TECHNIQUE: Multidetector CT imaging of the head, cervical spine, and maxillofacial structures were performed using the standard protocol without intravenous contrast. Multiplanar CT image reconstructions of the cervical spine and maxillofacial structures were also generated. COMPARISON:  None. FINDINGS: CT HEAD FINDINGS Brain: No evidence of acute infarction, hemorrhage, hydrocephalus, extra-axial collection or mass lesion/mass effect. Vascular: No hyperdense vessel or unexpected calcification. Skull: Normal. Negative for fracture or focal lesion. Other: None. CT MAXILLOFACIAL FINDINGS Osseous: There is subtle irregularity of the anterior alveolar process (series 8, image 53) anterior to the central incisors concerning for fracture. Mandible is otherwise intact. Temporomandibular joints are aligned. Bony orbital walls intact. Negative for nasal bone fracture. Orbits: Negative. No traumatic or inflammatory finding. Sinuses: Clear. Soft tissues: Multiple 3-4 mm irregular radiodensities within the soft tissues of the lower lip anteriorly (series 4, images 63-68) suspicious for foreign bodies. Soft tissue swelling over the anterior mandible/lower left. CT CERVICAL SPINE FINDINGS Alignment: Straightening of the cervical lordosis. No facet joint dislocation. Dens and lateral masses aligned. Skull base and vertebrae: No acute fracture. No primary bone lesion or focal pathologic process. Soft tissues and spinal canal: No prevertebral fluid or swelling. No visible canal hematoma. Disc levels: No significant degenerative findings. Intervertebral disc spaces are well preserved. Upper chest: Minimal linear scarring in the left lung apex. Other: None. IMPRESSION: 1. No acute intracranial abnormality. 2. Subtle irregularity of  the anterior alveolar process of the lower lip anterior to the central incisors concerning for fracture. 3. Soft tissue swelling over the anterior mandible/lower left. Multiple 3-4 mm irregular radiodensities within the soft tissues of the lower lip anteriorly suspicious for foreign bodies. 4. No evidence for acute traumatic injury to the cervical spine. Electronically Signed   By: Duanne Guess D.O.   On: 04/05/2019 12:58   CT Maxillofacial Wo Contrast  Result Date: 04/05/2019 CLINICAL DATA:  Facial and mandibular lacerations after fall EXAM: CT HEAD WITHOUT CONTRAST CT MAXILLOFACIAL WITHOUT CONTRAST CT CERVICAL SPINE WITHOUT CONTRAST TECHNIQUE: Multidetector CT imaging of the head, cervical spine, and maxillofacial structures were performed using the standard protocol without intravenous contrast. Multiplanar CT image reconstructions of the cervical spine and maxillofacial structures were also generated. COMPARISON:  None. FINDINGS: CT HEAD FINDINGS Brain: No evidence of acute infarction, hemorrhage, hydrocephalus, extra-axial collection or mass lesion/mass effect. Vascular: No hyperdense vessel or unexpected calcification. Skull: Normal. Negative for fracture or focal lesion. Other: None. CT MAXILLOFACIAL FINDINGS Osseous: There is subtle  irregularity of the anterior alveolar process (series 8, image 53) anterior to the central incisors concerning for fracture. Mandible is otherwise intact. Temporomandibular joints are aligned. Bony orbital walls intact. Negative for nasal bone fracture. Orbits: Negative. No traumatic or inflammatory finding. Sinuses: Clear. Soft tissues: Multiple 3-4 mm irregular radiodensities within the soft tissues of the lower lip anteriorly (series 4, images 63-68) suspicious for foreign bodies. Soft tissue swelling over the anterior mandible/lower left. CT CERVICAL SPINE FINDINGS Alignment: Straightening of the cervical lordosis. No facet joint dislocation. Dens and lateral masses  aligned. Skull base and vertebrae: No acute fracture. No primary bone lesion or focal pathologic process. Soft tissues and spinal canal: No prevertebral fluid or swelling. No visible canal hematoma. Disc levels: No significant degenerative findings. Intervertebral disc spaces are well preserved. Upper chest: Minimal linear scarring in the left lung apex. Other: None. IMPRESSION: 1. No acute intracranial abnormality. 2. Subtle irregularity of the anterior alveolar process of the lower lip anterior to the central incisors concerning for fracture. 3. Soft tissue swelling over the anterior mandible/lower left. Multiple 3-4 mm irregular radiodensities within the soft tissues of the lower lip anteriorly suspicious for foreign bodies. 4. No evidence for acute traumatic injury to the cervical spine. Electronically Signed   By: Davina Poke D.O.   On: 04/05/2019 12:58    Procedures .Marland KitchenLaceration Repair  Date/Time: 04/05/2019 2:57 PM Performed by: Kinnie Feil, PA-C Authorized by: Kinnie Feil, PA-C   Consent:    Consent obtained:  Verbal   Consent given by:  Patient   Risks discussed:  Infection, need for additional repair, pain, poor cosmetic result and poor wound healing   Alternatives discussed:  No treatment and delayed treatment Universal protocol:    Procedure explained and questions answered to patient or proxy's satisfaction: yes     Relevant documents present and verified: yes     Test results available and properly labeled: yes     Imaging studies available: yes     Required blood products, implants, devices, and special equipment available: yes     Site/side marked: yes     Immediately prior to procedure, a time out was called: yes     Patient identity confirmed:  Verbally with patient Anesthesia (see MAR for exact dosages):    Anesthesia method:  Local infiltration   Local anesthetic:  Lidocaine 1% w/o epi Laceration details:    Location:  Lip   Lip location:  Lower  exterior lip   Length (cm):  3 Repair type:    Repair type:  Intermediate Pre-procedure details:    Preparation:  Patient was prepped and draped in usual sterile fashion and imaging obtained to evaluate for foreign bodies Exploration:    Hemostasis achieved with:  Direct pressure   Wound extent: areolar tissue violated and foreign bodies/material     Contaminated: no   Treatment:    Area cleansed with:  Betadine and saline (irrigated with saline/peroxide and betadine)   Amount of cleaning:  Extensive   Irrigation volume:  200   Irrigation method:  Pressure wash   Visualized foreign bodies/material removed: yes (palpated tiny firm piece of likely teeth out of wound during irrigation)   Skin repair:    Repair method:  Sutures   Suture size:  4-0   Suture material:  Prolene   Suture technique:  Simple interrupted   Number of sutures:  3 Approximation:    Approximation:  Close   Vermilion border: well-aligned   Post-procedure  details:    Dressing:  Open (no dressing)   Patient tolerance of procedure:  Tolerated well, no immediate complications .Marland KitchenLaceration Repair  Date/Time: 04/05/2019 2:58 PM Performed by: Liberty Handy, PA-C Authorized by: Liberty Handy, PA-C   Consent:    Consent obtained:  Verbal   Consent given by:  Patient   Risks discussed:  Infection, need for additional repair, pain, poor cosmetic result and poor wound healing   Alternatives discussed:  No treatment and delayed treatment Universal protocol:    Procedure explained and questions answered to patient or proxy's satisfaction: yes     Relevant documents present and verified: yes     Test results available and properly labeled: yes     Imaging studies available: yes     Required blood products, implants, devices, and special equipment available: yes     Site/side marked: yes     Immediately prior to procedure, a time out was called: yes     Patient identity confirmed:  Verbally with  patient Anesthesia (see MAR for exact dosages):    Anesthesia method:  Local infiltration Laceration details:    Location:  Face   Face location:  Chin   Length (cm):  5 Repair type:    Repair type:  Simple Pre-procedure details:    Preparation:  Patient was prepped and draped in usual sterile fashion and imaging obtained to evaluate for foreign bodies Exploration:    Hemostasis achieved with:  Direct pressure   Wound exploration: wound explored through full range of motion and entire depth of wound probed and visualized     Wound extent: underlying fracture     Contaminated: no   Treatment:    Area cleansed with:  Betadine and saline (saline betadine and peroxide solution)   Amount of cleaning:  Extensive   Irrigation volume:  200   Irrigation method:  Pressure wash, tap and syringe   Visualized foreign bodies/material removed: no   Skin repair:    Repair method:  Sutures   Suture size:  4-0   Suture material:  Prolene   Suture technique:  Simple interrupted   Number of sutures:  5 Approximation:    Approximation:  Close Post-procedure details:    Dressing:  Open (no dressing)   Patient tolerance of procedure:  Tolerated well, no immediate complications   (including critical care time)  Medications Ordered in ED Medications  lidocaine (PF) (XYLOCAINE) 1 % injection 5 mL (5 mLs Infiltration Given by Other 04/05/19 1230)  Tdap (BOOSTRIX) injection 0.5 mL (0.5 mLs Intramuscular Given 04/05/19 1110)  Benzocaine (HURRCAINE) 20 % mouth spray ( Mouth/Throat Given by Other 04/05/19 1230)    ED Course  I have reviewed the triage vital signs and the nursing notes.  Pertinent labs & imaging results that were available during my care of the patient were reviewed by me and considered in my medical decision making (see chart for details).  Clinical Course as of Apr 04 1500  Fri Apr 05, 2019  1459 IMPRESSION: 1. No acute intracranial abnormality. 2. Subtle irregularity of the anterior  alveolar process of the lower lip anterior to the central incisors concerning for fracture. 3. Soft tissue swelling over the anterior mandible/lower left. Multiple 3-4 mm irregular radiodensities within the soft tissues of the lower lip anteriorly suspicious for foreign bodies. 4. No evidence for acute traumatic injury to the cervical spine.    CT Maxillofacial Wo Contrast [CG]    Clinical Course User Index [CG] Margarette Asal,  Delia Headylaudia J, PA-C   MDM Rules/Calculators/A&P                      29 year old with history of opioid abuse presents for evaluation of facial and lip injuries after a fall while intoxicated.  Exam reveals to external lacerations in the external lower lip and chin, these were thoroughly irrigated and repaired in the ER without immediate complications.  Internal exam reveals approximately 3 cm corresponding internal lower lip laceration consistent with through and through lip laceration, this was thoroughly irrigated and small hard likely foreign body were removed with irrigation.  There was a subtle irregularity at the gingiva of the frontal incisors and CT confirmed fracture through this area of the mandible.  No other traumatic findings on maxilla, head and C-spine CTs.  Tetanus was given.  Given mandibular fracture, overall poor dentition, will prescribe clindamycin.  Recommended ibuprofen/Tylenol, soft diet and benzocaine spray as needed for local pain control.  Wound care instructions given.  I do not think his mandibular fracture requires emergent consultation or intervention.  He was given Dr. Randa EvensJensen's contact information to follow-up.  Strict return precautions given.  He is comfortable the plan. Final Clinical Impression(s) / ED Diagnoses Final diagnoses:  Fall, initial encounter  Facial laceration, initial encounter  Lip laceration, initial encounter  Closed fracture of alveolar process of mandible, unspecified laterality, initial encounter (HCC)    Rx / DC  Orders ED Discharge Orders         Ordered    clindamycin (CLEOCIN) 150 MG capsule  3 times daily,   Status:  Discontinued     04/05/19 1455    clindamycin (CLEOCIN) 150 MG capsule  3 times daily     04/05/19 1501           Jerrell MylarGibbons, Kaislee Chao J, PA-C 04/05/19 1502    Gwyneth SproutPlunkett, Whitney, MD 04/05/19 1953

## 2019-04-05 NOTE — ED Notes (Signed)
Family friend, Delane, called for pt update- [phone]

## 2019-04-05 NOTE — ED Triage Notes (Signed)
Pt arrives to ED with significant mouth injury that pt states occurred 1 hour ago after using heroin pt blacked out and hit chin directly onto counter. Pt has a large laceration across chin with loose teeth and pt states he swallowed a couple teeth.

## 2019-04-08 MED FILL — CLINDAMYCIN HCL 150 MG CAPS: 150 | 10 days supply | Qty: 90 | Fill #0

## 2019-07-23 ENCOUNTER — Ambulatory Visit (INDEPENDENT_AMBULATORY_CARE_PROVIDER_SITE_OTHER): Payer: 59 | Admitting: Student in an Organized Health Care Education/Training Program

## 2019-07-23 ENCOUNTER — Other Ambulatory Visit: Payer: Self-pay

## 2019-07-23 VITALS — BP 137/89 | HR 81 | Ht 74.0 in | Wt 177.5 lb

## 2019-07-23 DIAGNOSIS — F1120 Opioid dependence, uncomplicated: Secondary | ICD-10-CM

## 2019-07-23 DIAGNOSIS — R011 Cardiac murmur, unspecified: Secondary | ICD-10-CM

## 2019-07-23 DIAGNOSIS — Z86711 Personal history of pulmonary embolism: Secondary | ICD-10-CM

## 2019-07-23 DIAGNOSIS — Z8619 Personal history of other infectious and parasitic diseases: Secondary | ICD-10-CM

## 2019-07-23 DIAGNOSIS — Z8739 Personal history of other diseases of the musculoskeletal system and connective tissue: Secondary | ICD-10-CM

## 2019-07-23 DIAGNOSIS — Z8679 Personal history of other diseases of the circulatory system: Secondary | ICD-10-CM

## 2019-07-23 MED ORDER — BUPRENORPHINE HCL-NALOXONE HCL 8-2 MG SL FILM: 1.0000 | ORAL_FILM | Freq: Two times a day (BID) | SUBLINGUAL | 0 refills | Status: DC
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
## 2019-07-23 NOTE — Assessment & Plan Note (Signed)
Severe opioid use disorder.  History of infectious complications.  Has been in relapse since May 2020.  Seems motivated to try medication assisted treatment again.  Has good support from his father.  Has good access to medications through insurance.  Plan is to restart Suboxone, he understands how to start with small 2-4 mg doses about 8 hours after last heroin use to avoid precipitated withdrawal.  Goal to get to 8 mg films twice daily for maintenance dose.  Follow-up with [place] in 2 weeks.  We will check blood work today for renal function, liver function, CBC, and check for infectious complications including recurrent hepatitis C, hepatitis B, HIV, and RPR.  Because he has a history of hepatitis C we will need to check a genotype, antibody will be chronically positive.

## 2019-07-24 ENCOUNTER — Telehealth: Payer: Self-pay

## 2019-07-24 LAB — CMP14 + ANION GAP
ALT: 22 IU/L (ref 0–44)
AST: 34 IU/L (ref 0–40)
Albumin/Globulin Ratio: 1.7 (ref 1.2–2.2)
Albumin: 4.3 g/dL (ref 4.1–5.2)
Alkaline Phosphatase: 92 IU/L (ref 39–117)
Anion Gap: 14 mmol/L (ref 10.0–18.0)
BUN/Creatinine Ratio: 25 — ABNORMAL HIGH (ref 9–20)
BUN: 21 mg/dL — ABNORMAL HIGH (ref 6–20)
Bilirubin Total: 0.4 mg/dL (ref 0.0–1.2)
CO2: 22 mmol/L (ref 20–29)
Calcium: 9.1 mg/dL (ref 8.7–10.2)
Chloride: 101 mmol/L (ref 96–106)
Creatinine, Ser: 0.85 mg/dL (ref 0.76–1.27)
GFR calc Af Amer: 135 mL/min/{1.73_m2} (ref >59)
GFR calc non Af Amer: 117 mL/min/{1.73_m2} (ref >59)
Globulin, Total: 2.6 g/dL (ref 1.5–4.5)
Glucose: 90 mg/dL (ref 65–99)
Potassium: 4.2 mmol/L (ref 3.5–5.2)
Sodium: 137 mmol/L (ref 134–144)
Total Protein: 6.9 g/dL (ref 6.0–8.5)

## 2019-07-24 LAB — HEPATITIS B SURFACE ANTIGEN: Hepatitis B Surface Ag: NEGATIVE

## 2019-07-24 LAB — HEPATITIS B SURFACE ANTIBODY,QUALITATIVE: Hep B Surface Ab, Qual: REACTIVE

## 2019-07-24 LAB — CBC
Hematocrit: 33.8 % — ABNORMAL LOW (ref 37.5–51.0)
Hemoglobin: 11.6 g/dL — ABNORMAL LOW (ref 13.0–17.7)
MCH: 29.1 pg (ref 26.6–33.0)
MCHC: 34.3 g/dL (ref 31.5–35.7)
MCV: 85 fL (ref 79–97)
Platelets: 279 10*3/uL (ref 150–450)
RBC: 3.98 x10E6/uL — ABNORMAL LOW (ref 4.14–5.80)
RDW: 13.3 % (ref 11.6–15.4)
WBC: 5.4 10*3/uL (ref 3.4–10.8)

## 2019-07-24 LAB — HEPATITIS B CORE ANTIBODY, TOTAL: Hep B Core Total Ab: NEGATIVE

## 2019-07-24 LAB — RPR: RPR Ser Ql: NONREACTIVE

## 2019-07-24 LAB — HCV RNA QUANT: Hepatitis C Quantitation: NOT DETECTED IU/mL

## 2019-07-24 LAB — HIV ANTIBODY (ROUTINE TESTING W REFLEX): HIV Screen 4th Generation wRfx: NONREACTIVE

## 2019-07-24 NOTE — Telephone Encounter (Signed)
Ok. It sounds like the suboxone films I sent are not preferred by his insurance? Can we follow up with his pharmacy/insurance to see if there is a different formulation or brand they prefer?   I think he is asking about the [facility] outpatient pharmacy program that provides generic suboxone for $25. However, that program is really intended only for patients who are uninsured. If he has insurance, we should try to work within that insurance to find the most affordable option.

## 2019-07-24 NOTE — Telephone Encounter (Signed)
Received TC from patient, states a RX for Buprenorphine HCl-Haloxone film was sent to pharmacy yesterday and it is over $100.  Requesting a new RX for the tablets instead to be sent to [facility]. Will forward to Dr. Oswaldo Done. SChaplin, RN,BSN

## 2019-07-25 ENCOUNTER — Telehealth: Payer: Self-pay | Admitting: *Deleted

## 2019-07-25 MED ORDER — BUPRENORPHINE HCL-NALOXONE HCL 8-2 MG SL FILM: 1.0000 | ORAL_FILM | Freq: Two times a day (BID) | SUBLINGUAL | 0 refills | Status: DC

## 2019-07-25 MED ORDER — BUPRENORPHINE HCL-NALOXONE HCL 8-2 MG SL SUBL: 1.0000 | SUBLINGUAL_TABLET | Freq: Two times a day (BID) | SUBLINGUAL | 0 refills | Status: DC

## 2019-07-25 NOTE — Telephone Encounter (Signed)
It has got to be tablets

## 2019-07-25 NOTE — Telephone Encounter (Signed)
Needs tablets, [facility] [place], [facility] has figured it out and is calling pt to explain what he needs to do but it must be tablets, it will be 0 dollar copay

## 2019-07-25 NOTE — Telephone Encounter (Signed)
Ok. I changed suboxone to the generic tablets and [facility].

## 2019-08-13 ENCOUNTER — Ambulatory Visit (INDEPENDENT_AMBULATORY_CARE_PROVIDER_SITE_OTHER): Payer: 59 | Admitting: Internal Medicine

## 2019-08-13 ENCOUNTER — Other Ambulatory Visit: Payer: Self-pay

## 2019-08-13 VITALS — BP 125/80 | HR 90 | Temp 98.2°F | Ht 74.0 in | Wt 174.1 lb

## 2019-08-13 DIAGNOSIS — F1120 Opioid dependence, uncomplicated: Secondary | ICD-10-CM | POA: Diagnosis not present

## 2019-08-13 DIAGNOSIS — Z79891 Long term (current) use of opiate analgesic: Secondary | ICD-10-CM | POA: Diagnosis not present

## 2019-08-15 NOTE — Assessment & Plan Note (Signed)
I am glad to see that he showed up for this visit shows that he is still engaged in therapy. I do believe that he is interested in stopping injection heroin use. He is not requesting a refill prescription he reports that he still has most of his previous prescription. We discussed again the benefits of Suboxone to control his cravings. He plans to restart if he runs out of Suboxone before his next visit he will call at least 3 days ahead.  Follow-up in 2 weeks.

## 2019-08-15 NOTE — Progress Notes (Signed)
   08/15/2019  Cody Rios presents for follow up of opioid use disorder I have reviewed the prior induction visit, follow up visits, and telephone encounters relevant to opiate use disorder (OUD) treatment.   Date of Induction: 07/23/19  Current follow up interval, in weeks: 29  HPI: 29 year old person living with opioid use disorder. He was represcribed Suboxone 2 weeks ago with Dr. Oswaldo Done. However he admits that he had not started it until this past Saturday. Even so he has "microdosed" himself only given about 2 to 4 mg a day and he has not stopped injection IV heroin use yet. He has been somewhat fearful of precipitated withdrawal. He expresses the desire to quit IV heroin use and he does plan in the next few days to start using full dose Suboxone therapy. He does not have any questions about the home induction. And he does not have any acute complaints.  Exam:   Vitals:   08/13/19 1040  BP: 125/80  Pulse: 90  Temp: 98.2 F (36.8 C)  TempSrc: Oral  SpO2: 98%  Weight: 174 lb 1.6 oz (79 kg)  Height: 6\' 2"  (1.88 m)    Gen: no distress, calm Skin: multiple injection sites over bilateral arms.  No skin or soft tissue infections. Ext: Trace pitting edema to bilateral ankles  Assessment/Plan:  See Problem Based Charting in the Encounters Tab    , DO  08/15/2019  3:19 PM

## 2019-08-18 LAB — TOXASSURE SELECT,+ANTIDEPR,UR

## 2020-01-25 ENCOUNTER — Emergency Department (HOSPITAL_COMMUNITY)
Admission: EM | Admit: 2020-01-25 | Discharge: 2020-01-25 | Disposition: A | Discharge status: Home / Self Care | Payer: 59 | Attending: Emergency Medicine | Admitting: Emergency Medicine

## 2020-01-25 ENCOUNTER — Encounter (HOSPITAL_COMMUNITY): Payer: Self-pay | Admitting: Emergency Medicine

## 2020-01-25 ENCOUNTER — Other Ambulatory Visit: Payer: Self-pay

## 2020-01-25 DIAGNOSIS — L03115 Cellulitis of right lower limb: Secondary | ICD-10-CM | POA: Diagnosis not present

## 2020-01-25 DIAGNOSIS — M79661 Pain in right lower leg: Secondary | ICD-10-CM | POA: Diagnosis present

## 2020-01-25 MED ORDER — DOXYCYCLINE HYCLATE 100 MG PO CAPS: 100.0000 mg | ORAL_CAPSULE | Freq: Two times a day (BID) | ORAL | 0 refills | Status: DC

## 2020-01-25 NOTE — ED Provider Notes (Signed)
[facility] EMERGENCY DEPARTMENT Provider Note  CSN: [id number] Arrival date & time: 01/25/20 0302  Chief Complaint(s) Insect Bite  HPI Cody Rios is a 29 y.o. male with a history of IV drug use who presents to the emergency department with right lower leg pain and erythema.  He reports that he sustained a wound to the anterior portion of his right lower leg after hitting a towing hitch last week.  He reports that he picked at the scab earlier this week and the next morning he noted that the erythema that rapidly progressed over the day.  Patient was actually seen in September for left arm cellulitis and had received doxycycline and Bactrim.  He reports that he had Bactrim left and took 2 doses.  Now the erythema is improving.  He denies any fevers or chills.  No coughing or congestion.  No chest pain shortness of breath.  No abdominal pain no other physical complaints.  HPI  Past Medical History Past Medical History:  Diagnosis Date  . 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 Hepatitis C infection 11/22/2016   Treated 2019  . MSSA bacteremia 11/10/2016  . Opioid use disorder, severe, on maintenance therapy (HCC) 11/22/2016  . Severe tricuspid regurgitation 11/10/2016   Patient Active Problem List   Diagnosis Date Noted  . Bilateral lower extremity edema 02/05/2019  . Hypertension 01/16/2018  . Opioid use disorder, severe, on maintenance therapy (HCC) 11/22/2016   Home Medication(s) Prior to Admission medications   Medication Sig Start Date End Date Taking? Authorizing Provider  buprenorphine-naloxone (SUBOXONE) 8-2 mg SUBL SL tablet Place 1 tablet under the tongue in the morning and at bedtime. 07/25/19   [name], MD  doxycycline (VIBRAMYCIN) 100 MG capsule Take 1 capsule (100 mg total) by mouth 2 (two) times daily. 01/25/20   Nira Conn, MD                                                                                                                                     Past Surgical History Past Surgical History:  Procedure Laterality Date  . HERNIA REPAIR    . KNEE SURGERY    . TEE WITHOUT CARDIOVERSION N/A 11/14/2016   Procedure: TRANSESOPHAGEAL ECHOCARDIOGRAM (TEE);  Surgeon: Elease Hashimoto Deloris Ping, MD;  Location: [facility] ENDOSCOPY;  Service: Cardiovascular;  Laterality: N/A;   Family History Family History  Problem Relation Age of Onset  . Hypertension Father     Social History Social History   Tobacco Use  . Smoking status: Never Smoker  . Smokeless tobacco: Former Neurosurgeon    Types: Engineer, drilling  . Vaping Use: Never used  Substance Use Topics  . Alcohol use: No  . Drug use: No    Types: IV, Heroin    Comment: Last used: 11/09/16   Allergies Cephalosporins and Penicillins  Review of Systems Review of Systems All other systems  are reviewed and are negative for acute change except as noted in the HPI  Physical Exam Vital Signs  I have reviewed the triage vital signs BP (!) 145/91 (BP Location: Right Arm)   Pulse 85   Temp 98 F (36.7 C) (Oral)   Resp 18   Ht 6\' 2"  (1.88 m)   Wt 77.1 kg   SpO2 99%   BMI 21.83 kg/m   Physical Exam Vitals reviewed.  Constitutional:      General: He is not in acute distress.    Appearance: He is well-developed. He is not diaphoretic.  HENT:     Head: Normocephalic and atraumatic.     Jaw: No trismus.     Right Ear: External ear normal.     Left Ear: External ear normal.     Nose: Nose normal.  Eyes:     General: No scleral icterus.    Conjunctiva/sclera: Conjunctivae normal.  Neck:     Trachea: Phonation normal.  Cardiovascular:     Rate and Rhythm: Normal rate and regular rhythm.  Pulmonary:     Effort: Pulmonary effort is normal. No respiratory distress.     Breath sounds: No stridor.  Abdominal:     General: There is no distension.  Musculoskeletal:        General: Normal range of motion.     Cervical back: Normal range of motion.        Legs:  Skin:    Findings: Erythema and wound present.  Neurological:     Mental Status: He is alert and oriented to person, place, and time.  Psychiatric:        Behavior: Behavior normal.     ED Results and Treatments Labs (all labs ordered are listed, but only abnormal results are displayed) Labs Reviewed - No data to display                                                                                                                       EKG  EKG Interpretation  Date/Time:    Ventricular Rate:    PR Interval:    QRS Duration:   QT Interval:    QTC Calculation:   R Axis:     Text Interpretation:        Radiology No results found.  Pertinent labs & imaging results that were available during my care of the patient were reviewed by me and considered in my medical decision making (see chart for details).  Medications Ordered in ED Medications - No data to display  Procedures Procedures  (including critical care time)  Medical Decision Making / ED Course I have reviewed the nursing notes for this encounter and the patient's prior records (if available in EHR or on provided paperwork).   Cody Rios was evaluated in Emergency Department on 01/25/2020 for the symptoms described in the history of present illness. He was evaluated in the context of the global COVID-19 pandemic, which necessitated consideration that the patient might be at risk for infection with the SARS-CoV-2 virus that causes COVID-19. Institutional protocols and algorithms that pertain to the evaluation of patients at risk for COVID-19 are in a state of rapid change based on information released by regulatory bodies including the CDC and federal and state organizations. These policies and algorithms were followed during the patient's care in the ED.  Right leg cellulitis  likely from anterior lower leg wound.  No evidence of underlying fluctuance.  Low suspicion for DVT.  Will prescribe doxycycline.  Patient reports he has Bactrim at home and instructed to finish the Bactrim as well.  Peer support consult placed for his addiction.      Final Clinical Impression(s) / ED Diagnoses Final diagnoses:  None    The patient appears reasonably screened and/or stabilized for discharge and I doubt any other medical condition or other [facility] requiring further screening, evaluation, or treatment in the ED at this time prior to discharge. Safe for discharge with strict return precautions.  Disposition: Discharge  Condition: Good  I have discussed the results, Dx and Tx plan with the patient/family who expressed understanding and agree(s) with the plan. Discharge instructions discussed at length. The patient/family was given strict return precautions who verbalized understanding of the instructions. No further questions at time of discharge.    ED Discharge Orders         Ordered    doxycycline (VIBRAMYCIN) 100 MG capsule  2 times daily        01/25/20 0458             This chart was dictated using voice recognition software.  Despite best efforts to proofread,  errors can occur which can change the documentation meaning.   Nira Conn, MD 01/25/20 [phone]

## 2020-01-25 NOTE — ED Triage Notes (Signed)
  Patient comes in with possible spider bite that occurred on Thursday night.  Patient has redness and swelling to R lower leg and ankle.  No visible bite marks.  Patient does have a 1 inch cut on R lower leg above the site but states it was from hitting it on a trailer hitch last week.  No fevers at home.  Was taking azithromycin at home that was left over from previous infection.  Pain 5/10.

## 2020-04-23 ENCOUNTER — Encounter: Payer: 59 | Admitting: Internal Medicine

## 2020-04-24 ENCOUNTER — Telehealth: Payer: Self-pay

## 2020-04-24 NOTE — Telephone Encounter (Signed)
Requesting to speak with a nurse about getting back with OUD clinic.

## 2020-04-27 NOTE — Telephone Encounter (Signed)
Sure

## 2020-04-27 NOTE — Telephone Encounter (Signed)
Returned call to patient. Given first available OUD appt for 05/05/2020 at 0945. Patient is very Adult nurse. Kinnie Feil, BSN, RN-BC

## 2020-05-05 ENCOUNTER — Ambulatory Visit (INDEPENDENT_AMBULATORY_CARE_PROVIDER_SITE_OTHER): Payer: 59 | Admitting: Internal Medicine

## 2020-05-05 VITALS — BP 137/86 | HR 90 | Temp 98.4°F | Wt 184.9 lb

## 2020-05-05 DIAGNOSIS — R600 Localized edema: Secondary | ICD-10-CM

## 2020-05-05 DIAGNOSIS — F1120 Opioid dependence, uncomplicated: Secondary | ICD-10-CM

## 2020-05-05 MED ORDER — BUPRENORPHINE HCL-NALOXONE HCL 8-2 MG SL SUBL: 1.0000 | SUBLINGUAL_TABLET | Freq: Two times a day (BID) | SUBLINGUAL | 0 refills | Status: DC

## 2020-05-05 MED ORDER — BUPRENORPHINE HCL-NALOXONE HCL 8-2 MG SL FILM: 1.0000 | ORAL_FILM | Freq: Two times a day (BID) | SUBLINGUAL | 1 refills | Status: DC

## 2020-05-05 NOTE — Patient Instructions (Signed)

## 2020-05-05 NOTE — Assessment & Plan Note (Signed)
Patient reports motivation to start Suboxone therapy and to avoid IV Heroin use. He request one week prescription due to cost of medication.  Opioid use disorder, severe, on maintenance therapy (HCC) - Buprenorphine HCl-Naloxone HCl (SUBOXONE) 8-2 MG FILM; Place 1 Film under the tongue in the morning and at bedtime.  Dispense: 14 each; Refill: 1

## 2020-05-05 NOTE — Progress Notes (Signed)
   05/05/2020  Cody Rios presents for follow up of opioid use disorder I have reviewed the prior induction visit, follow up visits, and telephone encounters relevant to opiate use disorder (OUD) treatment.   Date of Induction: 07/23/19, has not been seen in 6 months   Current follow up interval, in weeks: 88  HPI: 30 year old person living with opioid use disorder. He was last seen by Dr.Hoffman 6 months ago and has not been seen since. He last used IV Heroin this morning and also methamphetamines. He has noticed increased swelling of his ankles recently. Denies any fever, chills, PND, orthopnea, SOB, or chest pain.   Exam:   Vitals:   05/05/20 1020  BP: 137/86  Pulse: 90  Temp: 98.4 F (36.9 C)  TempSrc: Oral  SpO2: 98%  Weight: 184 lb 14.4 oz (83.9 kg)    Gen: appears anxious, not ill appearing  CV: RRR, 2/6 holosystolic murmur heard best a left lower sternal border, JVD 8-9 CM Lungs: CTAB, no adventitious breath sounds  Ext: non pitting edema to bilateral ankles, warm   Assessment/Plan:  See Problem Based Charting in the Encounters Tab    Albertha Ghee, MD  05/05/2020  10:31 AM

## 2020-05-05 NOTE — Assessment & Plan Note (Addendum)
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 He has lower extremity edema on exam and elevated JVD. He denies any SOB, chest pain, fever, chills,  orthopnea, or PND. Patient would not be candidate for valve repair with current IV Heroin use. Plan to continue treating OUD and no further workup at this time.

## 2020-05-08 ENCOUNTER — Telehealth: Payer: Self-pay

## 2020-05-08 DIAGNOSIS — F1120 Opioid dependence, uncomplicated: Secondary | ICD-10-CM

## 2020-05-08 NOTE — Telephone Encounter (Signed)
Cody Rios from [facility] called stating there is a Data processing manager in their system that will not allow them to accept Dr. Tildon Husky. He is sending a ticket on his end to ensure this does not happen in future, but as this can take time, he is requesting Rx be sent by different OUD Attending. Will forward to [facility] now. Kinnie Feil, BSN, RN-BC

## 2020-05-08 NOTE — Progress Notes (Signed)
Internal Medicine Clinic Attending  I saw and evaluated the patient.  I personally confirmed the key portions of the history and exam documented by Dr. Steen and I reviewed pertinent patient test results.  The assessment, diagnosis, and plan were formulated together and I agree with the documentation in the resident's note.     

## 2020-05-08 NOTE — Telephone Encounter (Signed)
Received TC from [employer], Donnie Coffin.  He states the Suboxone RX will not go through because Dr. Peterson Ao number is listed as not active. This RN verified MD's DEA number, but does not have XDEA number. Will forward to OUD nurse to follow up on SChaplin, RN,BSN

## 2020-05-08 NOTE — Telephone Encounter (Signed)
Returned call to [employer] at [facility]. Verified correct XDEA. He thinks it is going through. Explained that when this happened previously pharmacist put a ticket in on her end and it went through. Pharmacist will call back if there continues to be an issue. Kinnie Feil, BSN, RN-BC

## 2020-05-11 MED ORDER — BUPRENORPHINE HCL-NALOXONE HCL 8-2 MG SL FILM: 1.0000 | ORAL_FILM | Freq: Two times a day (BID) | SUBLINGUAL | 1 refills | Status: DC

## 2020-05-11 NOTE — Telephone Encounter (Signed)
Ok Rx resent

## 2020-05-11 NOTE — Telephone Encounter (Signed)
Incoming call from pt. Stated may take 2 -3 days before he can refill suboxone rx.; requesting to cancel this rx 2/7 and send to [facility] instead.  I called [facility], talked to [facility] - stated they still have a glitch in the system. Informed new rx sent today by Dr Oswaldo Done. She stated may have send rx to a different pharmacy but she ran it thru and stated it did go thru. Called pt back to inform him [facility] was able to process rx. Stated he will go there but will call back if he has any problems.

## 2020-05-19 ENCOUNTER — Ambulatory Visit (INDEPENDENT_AMBULATORY_CARE_PROVIDER_SITE_OTHER): Payer: 59 | Admitting: Student in an Organized Health Care Education/Training Program

## 2020-05-19 VITALS — BP 148/92 | HR 91 | Temp 98.4°F | Wt 173.7 lb

## 2020-05-19 DIAGNOSIS — F1120 Opioid dependence, uncomplicated: Secondary | ICD-10-CM

## 2020-05-19 DIAGNOSIS — R768 Other specified abnormal immunological findings in serum: Secondary | ICD-10-CM | POA: Diagnosis not present

## 2020-05-19 MED ORDER — BUPRENORPHINE HCL-NALOXONE HCL 8-2 MG SL FILM: 1.0000 | ORAL_FILM | Freq: Two times a day (BID) | SUBLINGUAL | 0 refills | Status: DC

## 2020-05-19 NOTE — Assessment & Plan Note (Signed)
Patient with a history of HCV genotype 1a previously treated around 2019.  He has had further exposure through intermittent drug use.  Its been about a year since we last checked for hepatitis C.  Plan is to check for HCV viral load today.

## 2020-05-19 NOTE — Assessment & Plan Note (Signed)
Doing well after induction with Suboxone 4 days ago.  Last opioid use was 5 days ago.  Planning to continue with Suboxone 1 film twice daily.  Has good access to opiates through his insurance, cost him about $35 for the prescription.  Reviewed the database which was appropriate.  We will follow-up with him in person in 2 weeks.  Given ongoing drug use going to check labs today to rule out HIV, hepatitis C, syphilis.

## 2020-05-19 NOTE — Progress Notes (Signed)
   Assessment and Plan:  See Encounters tab for problem-based medical decision making.   __________________________________________________________  HPI:   30 year old person here for follow-up of opioid use disorder.  Patient has started treatment several times with [place] over the last few years.  Has a history of a tricuspid valve infective endocarditis in 2018 causing a prolonged hospitalization.  He was restarted on Suboxone about 4 days ago and reports that induction went well, denies any precipitated withdrawal.  Currently stable taking 8 mg 2 times daily, or reports cravings are well controlled.  Living with a friend here in [place], works Holiday representative on occasion.  Reports strong motivation to be consistent with medication treatments.  Denies any recent fevers or chills.  No lower extremity edema.  Good exertional capacity.  Denies any skin or soft tissue infections.  His friend is with him today, reports good understanding of Isahia's history of opioid use disorder.  They have Narcan in the house, has never had to use it before.  __________________________________________________________  Problem List: Patient Active Problem List   Diagnosis Date Noted  . Opioid use disorder, severe, on maintenance therapy (HCC) 11/22/2016    Priority: High  . HCV antibody positive 05/19/2020    Priority: Low  . Bilateral lower extremity edema 02/05/2019  . Hypertension 01/16/2018    Medications: Reconciled today in Epic __________________________________________________________  Physical Exam:  Vital Signs: Vitals:   05/19/20 0941  BP: (!) 148/92  Pulse: 91  Temp: 98.4 F (36.9 C)  TempSrc: Oral  SpO2: 98%  Weight: 173 lb 11.2 oz (78.8 kg)    Gen: Well appearing, NAD CV: RRR, 2 out of 6 holosystolic murmur at the right lower sternal border Pulm: Normal effort, CTA throughout, no wheezing Ext: Warm, no edema, normal joints Skin: Mild thrombophlebitis of his right arm, no skin or  soft tissue infections appreciated.

## 2020-05-20 LAB — CMP14 + ANION GAP
ALT: 21 IU/L (ref 0–44)
AST: 21 IU/L (ref 0–40)
Albumin/Globulin Ratio: 1.8 (ref 1.2–2.2)
Albumin: 4.7 g/dL (ref 4.0–5.0)
Alkaline Phosphatase: 79 IU/L (ref 44–121)
Anion Gap: 18 mmol/L (ref 10.0–18.0)
BUN/Creatinine Ratio: 15 (ref 9–20)
BUN: 15 mg/dL (ref 6–20)
Bilirubin Total: 0.2 mg/dL (ref 0.0–1.2)
CO2: 21 mmol/L (ref 20–29)
Calcium: 9.5 mg/dL (ref 8.7–10.2)
Chloride: 99 mmol/L (ref 96–106)
Creatinine, Ser: 1 mg/dL (ref 0.76–1.27)
GFR calc Af Amer: 115 mL/min/{1.73_m2} (ref >59)
GFR calc non Af Amer: 100 mL/min/{1.73_m2} (ref >59)
Globulin, Total: 2.6 g/dL (ref 1.5–4.5)
Glucose: 94 mg/dL (ref 65–99)
Potassium: 4.3 mmol/L (ref 3.5–5.2)
Sodium: 138 mmol/L (ref 134–144)
Total Protein: 7.3 g/dL (ref 6.0–8.5)

## 2020-05-20 LAB — HIV ANTIBODY (ROUTINE TESTING W REFLEX): HIV Screen 4th Generation wRfx: NONREACTIVE

## 2020-05-20 LAB — HCV RNA QUANT: Hepatitis C Quantitation: NOT DETECTED IU/mL

## 2020-05-20 LAB — RPR: RPR Ser Ql: NONREACTIVE

## 2020-06-02 ENCOUNTER — Telehealth: Payer: Self-pay | Admitting: *Deleted

## 2020-06-02 NOTE — Telephone Encounter (Signed)
Call placed to patient for today's missed appt. No answer and [facility] is full.

## 2020-06-04 ENCOUNTER — Other Ambulatory Visit: Payer: Self-pay | Admitting: Internal Medicine

## 2020-06-04 DIAGNOSIS — F1120 Opioid dependence, uncomplicated: Secondary | ICD-10-CM

## 2020-06-04 NOTE — Telephone Encounter (Signed)
Patient missed appointment, we tried several times to touch base with him.  I will be happy to see him on Tuesday and see if we can get him back on suboxone at that time.

## 2020-06-04 NOTE — Telephone Encounter (Signed)
Refill Request- Pt states he will completely out of medication on 06/06/2020.  The patient has sch an appt for 06/09/2020 @ 9:15 am with the OUD Clinic  Buprenorphine HCl-Naloxone HCl (SUBOXONE) 8-2 MG FILM  [id number] - [place], [place] - [address] AT [facility] [address] (Ph: [phone])

## 2020-06-09 ENCOUNTER — Ambulatory Visit (INDEPENDENT_AMBULATORY_CARE_PROVIDER_SITE_OTHER): Payer: 59 | Admitting: Internal Medicine

## 2020-06-09 ENCOUNTER — Other Ambulatory Visit: Payer: Self-pay

## 2020-06-09 DIAGNOSIS — F1120 Opioid dependence, uncomplicated: Secondary | ICD-10-CM

## 2020-06-09 MED ORDER — BUPRENORPHINE HCL-NALOXONE HCL 8-2 MG SL FILM: 1.0000 | ORAL_FILM | Freq: Two times a day (BID) | SUBLINGUAL | 0 refills | Status: DC

## 2020-06-09 NOTE — Patient Instructions (Addendum)
Instruction for starting buprenorphine-naloxone (Suboxone) at home  You should not mix buprenorphine-naloxone with other drugs especially large amounts of alcohol or benzodiazepines (Valium, Klonopin, Xanax, Ativan). If you have taken any of these medication, please tell your healthcare team and do not take buprenorphine-naloxone.   You must wait until you are feeling signs of withdrawal from opiates (heroin, pain pills) before you take buprenorphine-naloxone.  If you do not wait long enough the medication will make you sicker.  If you do take it too soon and get sicker then wait until later when you feel signs of withdrawal listed below and then try again.   Signs that you are withdrawing: ? Anxiety, restlessness, can't sit still ? Aches ? Nausea or sick to your stomach ? Goose-bumps ? Racing heart   You should have ALL of these symptoms before you start taking your first dose of buprenorphine-naloxone. If you are not sure call your healthcare team.    When it's time to take your first dose 1. Split your pill or film in half 2. Make sure your mouth is empty of everything (no candy/gum/etc) 3. Sit or stand, but do not lie down 4. Swallow a sip of water to wet your mouth  5. Put the half of the tablet or film under your tongue. Do not suck or swallow it. It must stay there until it is completely dissolved. Try to not even swallow your spit during this time. Anything that you swallow will not make you feel better.   In 20 minutes: You should start feeling a little better. If you feel worse then you started too early so you would wait a few hours and then try again later.    In one hour: You can take the other half of the pill or film the same way you took the first one.   In 2 hours: if you are still feeling symptoms of withdrawal listed above you can take another half a pill or film. You can repeat this if needed until you take a total of 2 pills or 2 films (16mg ). You may need less than  this to control your symptoms.  You should adjust your dose so that you are taking one and half or two pills or films per day (12-16mg  per day). At this dose you should have cut down on cravings and help with any withdrawal symptoms.   The next day:  In the morning you can take the same amount you took yesterday all at one time in the morning.  Expect a call from your team to see how you are doing.   If you have any questions or concerns at any time call your healthcare team.  Clinic Number:  830am - 5pm: [phone] After Hours Number: [phone] - - Leave your number and expect a call back from a physician.   To Mr. Zawistowski,  It was a pleasure meeting you today! Today we discussed your suboxone regimen. Our plan will be to see you back in two weeks to assess your current regimen and make adjustments as needed. Have a good day! Loletta Specter, MD

## 2020-06-09 NOTE — Progress Notes (Signed)
   06/09/2020  Dempsey A Leiber presents for follow up of opioid use disorder I have reviewed the prior induction visit, follow up visits, and telephone encounters relevant to opiate use disorder (OUD) treatment.   Current daily dose: Suboxone 8-2 Film Twice daily #28  Date of Induction: 07/23/19  Current follow up interval, in weeks: 2 weeks  The patient has been adherent with the buprenorphine for OUD contract.   Last UDS Result: + for methamphetemine, ampheamine, codeine, morphine, normorphine, norcodeine, fentanyl,   HPI:  Mr. Czajka states that he is doing well this week. He states that he is not experiencing withdrawal symptoms such as nausea, cramping, excessive sweating, diarrhea. He does enodorse feeling cravings, and is worried that his current regimen is not quelling his cravings.  He did miss his last appointment a week ago, and has spaced out his suboxone with IV heroin, with his last use being on 06/06/20.   Exam:   Vitals:   06/09/20 0841  BP: (!) 131/92  Pulse: 89  Temp: 98.2 F (36.8 C)  TempSrc: Oral  SpO2: 98%  Weight: 174 lb 1.6 oz (79 kg)  Height: 6' 2.5" (1.892 m)    Physical Exam Constitutional:      General: He is not in acute distress.    Appearance: Normal appearance. He is normal weight. He is not ill-appearing, toxic-appearing or diaphoretic.  Eyes:     Conjunctiva/sclera: Conjunctivae normal.  Cardiovascular:     Rate and Rhythm: Normal rate and regular rhythm.     Pulses: Normal pulses.     Heart sounds: Murmur heard.      Comments: 2/6 holosystolic murmer appreciated at the apex.  Pulmonary:     Effort: Pulmonary effort is normal.     Breath sounds: Normal breath sounds.  Skin:    General: Skin is warm.     Comments: Multiple injection sites on the dorsal aspect of the hands bilaterally. No erythema, drainage, purulence appreciated.   Neurological:     Mental Status: He is alert and oriented to person, place, and time.  Psychiatric:         Mood and Affect: Mood normal.        Behavior: Behavior normal.     Assessment/Plan:  See Problem Based Charting in the Encounters Tab   Dolan Amen, MD  06/09/2020  9:11 AM

## 2020-06-09 NOTE — Assessment & Plan Note (Signed)
Patient states that he is doing well. He missed his appointment a weeks ago, and has used IV heroin (last use 06/06/20). Denies withdrawal symptoms at this time, but endorses cravings. He has been inconsistent with his appointments, would like to have him seen more frequently in the office before considering changes in his current dose of Suboxone.  - Tox screen today.  - Follow up in 2 weeks - Suboxone 8-2 Film #28

## 2020-06-09 NOTE — Progress Notes (Signed)
Internal Medicine Clinic Attending ? ?Case discussed with Dr. Winters  At the time of the visit.  We reviewed the resident?s history and exam and pertinent patient test results.  I agree with the assessment, diagnosis, and plan of care documented in the resident?s note.  ?

## 2020-06-11 ENCOUNTER — Other Ambulatory Visit: Payer: Self-pay

## 2020-06-11 DIAGNOSIS — F1120 Opioid dependence, uncomplicated: Secondary | ICD-10-CM

## 2020-06-11 MED ORDER — BUPRENORPHINE HCL-NALOXONE HCL 8-2 MG SL FILM: 1.0000 | ORAL_FILM | Freq: Two times a day (BID) | SUBLINGUAL | 0 refills | Status: DC

## 2020-06-11 NOTE — Telephone Encounter (Signed)
Resent Rx 

## 2020-06-11 NOTE — Telephone Encounter (Addendum)
Front office received phone call from [facility]. Stating Dr. Jule Ser Rx for suboxone will also not go through their system. States it will take 5 days to correct this problem. They are asking we not use their location for this med going forward. Attempted to notify pt and ask which pharmacy he would like Rx sent to instead. No answer and [facility] is full. Will forward to FO in case pt returns call.

## 2020-06-11 NOTE — Telephone Encounter (Signed)
Attempted to notify patient of below. No answer and [facility] is full and cannot accept new messages at this time.

## 2020-06-11 NOTE — Telephone Encounter (Signed)
Spoke with pharmacist at [facility]. States their system does not recognize Dr. Tildon Husky. They have put in an IT ticket but were told there is no way to fix this problem. Requesting alternate provider resend Rx for suboxone.

## 2020-06-11 NOTE — Telephone Encounter (Signed)
Pt states he cannot get Buprenorphine HCl-Naloxone HCl (SUBOXONE) 8-2 MG FILM. Please call pt back.

## 2020-06-16 ENCOUNTER — Telehealth: Payer: Self-pay

## 2020-06-16 DIAGNOSIS — F1120 Opioid dependence, uncomplicated: Secondary | ICD-10-CM

## 2020-06-16 NOTE — Telephone Encounter (Signed)
Pt is requesting a call back about his medication

## 2020-06-16 NOTE — Telephone Encounter (Signed)
Return pt's call - no answer; mailbox is full, unable to leave a message.

## 2020-06-17 MED ORDER — BUPRENORPHINE HCL-NALOXONE HCL 8-2 MG SL FILM: 1.0000 | ORAL_FILM | Freq: Two times a day (BID) | SUBLINGUAL | 0 refills | Status: DC

## 2020-06-17 NOTE — Telephone Encounter (Signed)
Called pt to inform him of refill but no answer; mailbox is full - unable to leave a message.

## 2020-06-17 NOTE — Telephone Encounter (Signed)
I called [facility] on [address] - stated they are still having problems filling Suboxone; glitch in the system. I asked them to cancel the rx.  Called pt back - informed of the above. I asked which pharmacy to send rx - stated [facility] on [address].

## 2020-06-18 LAB — TOXASSURE SELECT,+ANTIDEPR,UR

## 2020-06-22 ENCOUNTER — Telehealth: Payer: Self-pay

## 2020-06-22 DIAGNOSIS — F1120 Opioid dependence, uncomplicated: Secondary | ICD-10-CM

## 2020-06-22 MED ORDER — BUPRENORPHINE HCL-NALOXONE HCL 8-2 MG SL FILM: 1.0000 | ORAL_FILM | Freq: Two times a day (BID) | SUBLINGUAL | 0 refills | Status: DC

## 2020-06-22 NOTE — Telephone Encounter (Signed)
Pt states he cannot get Buprenorphine HCl-Naloxone HCl (SUBOXONE) 8-2 MG FILM @ [facility]. Please call pt back.

## 2020-06-22 NOTE — Telephone Encounter (Addendum)
Appt r/s for 07/07/2020 at 0945. Patient notified of refill and new appt. He was very Adult nurse.

## 2020-06-22 NOTE — Telephone Encounter (Signed)
2 week supply of suboxone sent to [facility] on [address]. Please have him follow up in OUD clinic in 2 weeks. Thanks!

## 2020-06-22 NOTE — Telephone Encounter (Signed)
Returned call to patient. States he has not been able to p/u Suboxone ordered on 06/09/2020 at either [facility]. Requesting this be sent to [facility] on [address]. Reminded patient of OUD appt tomorrow. States he has court tomorrow AM and is wondering if he can r/s for 2 weeks. (06/30/2020 is already booked and patient has not been able to restart med.

## 2020-07-07 ENCOUNTER — Other Ambulatory Visit: Payer: Self-pay

## 2020-07-07 ENCOUNTER — Telehealth: Payer: Self-pay | Admitting: *Deleted

## 2020-07-07 ENCOUNTER — Ambulatory Visit (INDEPENDENT_AMBULATORY_CARE_PROVIDER_SITE_OTHER): Payer: 59 | Admitting: Internal Medicine

## 2020-07-07 DIAGNOSIS — F1120 Opioid dependence, uncomplicated: Secondary | ICD-10-CM | POA: Diagnosis not present

## 2020-07-07 MED ORDER — BUPRENORPHINE HCL-NALOXONE HCL 8-2 MG SL FILM: 1.0000 | ORAL_FILM | Freq: Two times a day (BID) | SUBLINGUAL | 0 refills | Status: DC

## 2020-07-07 NOTE — Telephone Encounter (Signed)
Patient forgot today's appt. We will do tele visit with him today. Call transferred to Provider

## 2020-07-07 NOTE — Progress Notes (Signed)
  [facility] Internal Medicine Residency Telephone Encounter Continuity Care Appointment  HPI:   This telephone encounter was created for Cody. Cody Rios on 07/07/2020 for the following purpose/cc opioid use order.   Past Medical History:  Past Medical History:  Diagnosis Date  . 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 Hepatitis C infection 11/22/2016   Treated 2019  . MSSA bacteremia 11/10/2016  . Opioid use disorder, severe, on maintenance therapy (HCC) 11/22/2016  . Severe tricuspid regurgitation 11/10/2016      ROS:   Negative except as stated in HPI.    Assessment / Plan / Recommendations:   Please see A&P under problem oriented charting for assessment of the patient's acute and chronic medical conditions.   As always, pt is advised that if symptoms worsen or new symptoms arise, they should go to an urgent care facility or to to ER for further evaluation.   Consent and Medical Decision Making:   Patient discussed with Dr. Oswaldo Rios  This is a telephone encounter between Cody Rios and Cody Rios on 07/07/2020 for opioid use disorder . The visit was conducted with the patient located at home and Cody Rios at [facility]. The patient's identity was confirmed using their DOB and current address. The patient has consented to being evaluated through a telephone encounter and understands the associated risks (an examination cannot be Rios and the patient may need to come in for an appointment) / benefits (allows the patient to remain at home, decreasing exposure to coronavirus). I personally spent 10 minutes on medical discussion.    Cody Rios is following up for his opioid use disorder. Patient was initially scheduled for inpatient visit but switched to telehealth. He was recently seen on 3/8 for OUD after he had relapse on 3/5 with IV heroin use. He notes that this was in setting of not getting his suboxone for 12 day stretch. His UDS at the time was positive for  buprenorphine, amphetamine, morphine and fentanyl. He notes that since his last visit, he is only taking suboxone twice daily. He denies any withdrawal symptoms or cravings. He does endorse using marijuana last night but denies any other illicit drug use.

## 2020-07-07 NOTE — Assessment & Plan Note (Signed)
Mr Cody Rios is following up for his opioid use disorder. Patient was initially scheduled for inpatient visit but switched to telehealth. He was recently seen on 3/8 for OUD after he had relapse on 3/5 with IV heroin use. He notes that this was in setting of not getting his suboxone for 12 day stretch. His UDS at the time was positive for buprenorphine, amphetamine, morphine and fentanyl. He notes that since his last visit, he is only taking suboxone twice daily. He denies any withdrawal symptoms or cravings. He does endorse using marijuana last night but denies any other illicit drug use.  PDMP and recent UTox reviewed.  Plan: Continue suboxone 8-2mg  1 film twice daily UTox at next visit  Follow up in 2 weeks

## 2020-07-08 NOTE — Progress Notes (Signed)
Internal Medicine Clinic Attending ° °Case discussed with Dr. Aslam  At the time of the visit.  We reviewed the resident’s history and exam and pertinent patient test results.  I agree with the assessment, diagnosis, and plan of care documented in the resident’s note.  °

## 2020-10-06 ENCOUNTER — Encounter: Payer: Self-pay | Admitting: *Deleted

## 2021-01-25 ENCOUNTER — Telehealth: Payer: Self-pay | Admitting: Internal Medicine

## 2021-01-25 NOTE — Telephone Encounter (Signed)
Pt requesting to come back to the OUD clinic.  Please advise if this patient can be sch.

## 2021-01-26 NOTE — Telephone Encounter (Signed)
Attempted to contact patient via telephone to schedule an appointment, no answer and mail box was full.

## 2021-02-23 ENCOUNTER — Other Ambulatory Visit: Payer: Self-pay

## 2021-02-23 ENCOUNTER — Encounter: Payer: Self-pay | Admitting: Student

## 2021-02-23 ENCOUNTER — Ambulatory Visit (INDEPENDENT_AMBULATORY_CARE_PROVIDER_SITE_OTHER): Payer: 59 | Admitting: Student

## 2021-02-23 VITALS — BP 137/76 | HR 103 | Temp 98.3°F | Ht 75.0 in | Wt 170.0 lb

## 2021-02-23 DIAGNOSIS — F419 Anxiety disorder, unspecified: Secondary | ICD-10-CM

## 2021-02-23 DIAGNOSIS — F32A Depression, unspecified: Secondary | ICD-10-CM

## 2021-02-23 DIAGNOSIS — F1120 Opioid dependence, uncomplicated: Secondary | ICD-10-CM | POA: Diagnosis not present

## 2021-02-23 DIAGNOSIS — R768 Other specified abnormal immunological findings in serum: Secondary | ICD-10-CM

## 2021-02-23 DIAGNOSIS — R002 Palpitations: Secondary | ICD-10-CM | POA: Diagnosis not present

## 2021-02-23 MED ORDER — BUPRENORPHINE HCL-NALOXONE HCL 8-2 MG SL SUBL: 2.0000 | SUBLINGUAL_TABLET | Freq: Every day | SUBLINGUAL | 1 refills | Status: DC

## 2021-02-23 NOTE — Patient Instructions (Signed)
Cody Rios, it was a pleasure seeing you today!  Today we discussed: - We have sent in a prescription for suboxone three times daily. Please see the instructions below.   Instruction for starting buprenorphine-naloxone (Suboxone) at home  You should not mix buprenorphine-naloxone with other drugs especially large amounts of alcohol or benzodiazepines (Valium, Klonopin, Xanax, Ativan). If you have taken any of these medication, please tell your healthcare team and do not take buprenorphine-naloxone.   You must wait until you are feeling signs of withdrawal from opiates (heroin, pain pills) before you take buprenorphine-naloxone.  If you do not wait long enough the medication will make you sicker.  If you do take it too soon and get sicker then wait until later when you feel signs of withdrawal listed below and then try again.   Signs that you are withdrawing: Anxiety, restlessness, can't sit still Aches Nausea or sick to your stomach Goose-bumps Racing heart   You should have ALL of these symptoms before you start taking your first dose of buprenorphine-naloxone. If you are not sure call your healthcare team.    When it's time to take your first dose Split your pill or film in half Make sure your mouth is empty of everything (no candy/gum/etc) Sit or stand, but do not lie down Swallow a sip of water to wet your mouth  Put the half of the tablet or film under your tongue. Do not suck or swallow it. It must stay there until it is completely dissolved. Try to not even swallow your spit during this time. Anything that you swallow will not make you feel better.   In 20 minutes: You should start feeling a little better. If you feel worse then you started too early so you would wait a few hours and then try again later.    In one hour: You can take the other half of the pill or film the same way you took the first one.   In 2 hours: if you are still feeling symptoms of withdrawal  listed above you can take another half a pill or film. You can repeat this if needed until you take a total of 2 pills or 2 films (16mg ). You may need less than this to control your symptoms.  You should adjust your dose so that you are taking one and half or two pills or films per day (12-16mg  per day). At this dose you should have cut down on cravings and help with any withdrawal symptoms.   The next day:  In the morning you can take the same amount you took yesterday all at one time in the morning.  Expect a call from your team to see how you are doing.   If you have any questions or concerns at any time call your healthcare team.  Clinic Number:  830am - 5pm: [phone] After Hours Number: [phone] - - Leave your number and expect a call back from a physician.   - I have also sent in a referral to our therapist, Dr. [phone].  I have ordered the following labs today:   Lab Orders         CMP14 + Anion Gap         HIV antibody (with reflex)         HCV RNA quant       Follow-up:  2 weeks    Please make sure to arrive 15 minutes prior to your  next appointment. If you arrive late, you may be asked to reschedule.   We look forward to seeing you next time. Please call our clinic at [phone] if you have any questions or concerns. The best time to call is Monday-Friday from 9am-4pm, but there is someone available 24/7. If after hours or the weekend, call the main hospital number and ask for the Internal Medicine Resident On-Call. If you need medication refills, please notify your pharmacy one week in advance and they will send [place] a request.  Thank you for letting us take part in your care. Wishing you the best!  Thank you, Evlyn Kanner, MD

## 2021-02-23 NOTE — Assessment & Plan Note (Signed)
During our visit today, Mr. Uptain reports intermittent palpitations. Describes these episodes as an "abnormal heart rhythm." Patient does not endorse chest pain, but rather a "throbbing" sensation that correlates with his heartbeat. These symptoms typically occur when he is most anxious and can last a few minutes at a time. Denies any association with exertion or rest. Mentions that he never feels withdrawal symptoms because he uses heroin enough to avoid them. Of note, patient does report a family cardiac history notable for sister who died after CABG at age 27. No family history of sudden cardiac death.  Discussed with Mr. Zaring that I would like to initially start working on his opioid use disorder to see if his drug use is exacerbating any of these symptoms. If he continues to experience these episodes without associated drug use, we can consider cardiology referral.   - Continue to monitor, consider cardiology referral if symptoms persist

## 2021-02-23 NOTE — Assessment & Plan Note (Signed)
Routine screening for anxiety and depression today revealed PHQ-9 of 18, GAD-7 of 16. Patient is currently asking for referral to integrated behavioral health. Hopeful that cessation of drugs will improve his symptoms. If continues to persist can consider SSRI.  - Referral Dr. Monna Fam

## 2021-02-23 NOTE — Progress Notes (Signed)
02/23/2021  Cody Rios presents for buprenorphine/naloxone intake visit.   I have reviewed EPIC data including labwork which was available.  I have reviewed outside records provided by patient if available.  The salient points were confirmed with the patient.    Review of substance use history (first use, substances used, any illicit purchases): IV heroin  Last substance used: IV heroin  If last substance not an opioid, last opioid used (type, dose, route, withdrawal symptoms): n/a  Mental Health History: Generalized anxiety disorder  Current counseling/behavioural health provider: n/a  This patient has Opioid Use Disorder by following DSM-V criteria: Keep those that apply.  - Opioids taken in larger amounts or over a longer period than intended - Persistent desire to cut down - A great deal of time is spent to obtain/use/recover from the opioid - Cravings to use opioids - Use resulting in a failure to fulfill major role obligations - Continue opioid use despite persistent social or interpersonal problems - Important activities are given up or reduced because of opioid use - Recurrent opioid use in situations in which it is physically hazardous - Use despite knowledge of health problems caused by opioids - Tolerance - Withdrawal  Past Medical History:  Diagnosis Date   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 28 each 0   No current facility-administered medications on file prior to visit.    Physical Exam  Vitals:   02/23/21 1130  BP: 137/76  Pulse: (!) 103  Temp:  98.3 F (36.8 C)  TempSrc: Oral  SpO2: 97%  Weight: 170 lb (77.1 kg)  Height: 6\' 3"  (1.905 m)   General: Anxious-appearing, mildly tremulous. No acute distress. CV: Regular rate, rhythm. No murmurs appreciated. Pulm: Normal work of breathing on room air. Skin: Multiple track marks on bilateral antecubital spaces, anterior left neck. No surrounding erythema or purulence. Neuro: Awake, alert, conversing appropriately. No focal deficits. Psych: Anxious. Fast-paced speech.   Clinical Opiate Withdrawal Scale: bold applicable COWS scoring   - Resting HR:    - 0 for < 80   - 1 for 81 - 100   - 2 for 101 - 120   - 4 for > 120  - Sweating:   - 0 for no chills/flushing   - 1 for subjective chills/flushing   - 3 for beads of sweat on brow/face   - 4 for sweat streaming off of face  - Restlessness:    - 0 for able to sit still   - 1 for subjective difficulty sitting still   - 3 for frequent shifting or extraneous movement   - 5 for unable to sit still for more than a few seconds  - Pupil size:    - 0 for pinpoint or normal   - 1 for possibly larger than normal   - 2 for moderately dilated   - 5 for only iris rim visible  - Bone/joint pain:    - 0 for not present   - 1 for mild diffuse discomfort   - 2 severe diffuse aching   - 4 for objectively  rubbing joints/muscles and obviously in pain  - Runny nose/tearing:    - 0 for not present   - 1 for stuffy nose/moist eyes   - 2 for nose running/tearing   - 4 for nose constantly running or tears streaming down cheeks  - GI Upset:    - 0 for no GI symptoms   - 1 for stomach cramps   - 2 for nausea or loose stool   - 3 for vomiting or diarrhea   - 5 for multiple episodes of vomiting or diarrhea  - Tremor observation of outstretched hands:    - 0 for no tremor   - 1 for tremor can be felt but not observed   - 2 for slight tremor observable   - 4 for gross tremor or muscle twitching  - Yawning:    - 0 for no yawning   - 1 for  yawning once or twice during assessment   - 2 for yawning three or more times during assessment   - 4 for yawning several times per minute  - Anxiety or irritability:    - 0 for none   - 1 for patient reports increasing irritability or anxiousness   - 2 for patient obviously irritable/anxious   - 4 for patient so irritable/anxious that assessment is difficult  - Gooseflesh:    - 0 for skin is smooth   - 3 for piloerection of skin can be felt or seen   - 5 for prominent piloerection  TOTAL: 7  Assessment/Plan:   Based on a review of the patient's medical history including substance use and mental health factors, and physical exam, Cody Rios is a suitable candidate for MAT with buprenorphine/naloxone.  UDS ordered this visit.    I have discussed HIV and Hepatitis C screening with this patient.    I will place orders for CMET for baseline LFT evaluation if needed.    Home Induction:   I have instructed the patient how to appropriately take this medication, including placing under the tongue with head relaxed for 10 minutes and allowing to dissolve without chewing or swallowing tab/film, and with nothing to eat or drink in the subsequent 15 minutes.  They have been told not to start taking the medication until they have significant signs of withdrawal and I have explained the concept of precipitated withdrawal with the patient.    Intervisit Care:  We discussed this medication must be kept in a safe place and away from children.   We will see the patient back in 1 week in clinic, with options for a sooner appointment based on patient and provider preference.   Patient was encouraged to call the office and speak with the MD on call for any urgent concerns.    Evlyn Kanner, MD 02/23/2021 10:35 AM

## 2021-02-23 NOTE — Assessment & Plan Note (Signed)
Cody Rios is presenting today for opioid use disorder. He was last seen in our clinic in April wand was taking Suboxone twice daily for 2-3 months. Prior to this, Cody Rios had been well-controlled on Suboxone until his mother died in 08/18/19. Cody Rios reports this event triggered him to start using IV heroin again. Since this past April, Cody Rios reports daily use of IV heroin, including this morning. He states that he wishes to get his life together, as currently his days are spent trying to get more drugs. He notes the needles he has been using are from the pharmacy and denies sharing needles.   Discussed with Cody Rios that we can start twice daily Suboxone, as this was his previous dose in April. We will see him again in two weeks for a dose adjustment. I also discussed obtaining a HIV test and CMET for initiating lab work. In addition, Cody Rios does have a history of positive hepatitis C antibody. We will obtain HCV RNA quant today.  - Start Suboxone 8-2mg  twice daily - CMET, HIV Ab, HepC RNA quant today - Will need Tox[employer]ure at next visit - Follow-up in two weeks

## 2021-02-24 LAB — CMP14 + ANION GAP
ALT: 25 IU/L (ref 0–44)
AST: 29 IU/L (ref 0–40)
Albumin/Globulin Ratio: 1.8 (ref 1.2–2.2)
Albumin: 4.4 g/dL (ref 4.0–5.0)
Alkaline Phosphatase: 82 IU/L (ref 44–121)
Anion Gap: 13 mmol/L (ref 10.0–18.0)
BUN/Creatinine Ratio: 20 (ref 9–20)
BUN: 17 mg/dL (ref 6–20)
Bilirubin Total: 0.3 mg/dL (ref 0.0–1.2)
CO2: 27 mmol/L (ref 20–29)
Calcium: 9.3 mg/dL (ref 8.7–10.2)
Chloride: 105 mmol/L (ref 96–106)
Creatinine, Ser: 0.86 mg/dL (ref 0.76–1.27)
Globulin, Total: 2.5 g/dL (ref 1.5–4.5)
Glucose: 115 mg/dL — ABNORMAL HIGH (ref 70–99)
Potassium: 4.4 mmol/L (ref 3.5–5.2)
Sodium: 145 mmol/L — ABNORMAL HIGH (ref 134–144)
Total Protein: 6.9 g/dL (ref 6.0–8.5)
eGFR: 119 mL/min/{1.73_m2} (ref >59)

## 2021-02-24 LAB — HCV RNA QUANT: Hepatitis C Quantitation: NOT DETECTED IU/mL

## 2021-02-24 LAB — HIV ANTIBODY (ROUTINE TESTING W REFLEX): HIV Screen 4th Generation wRfx: NONREACTIVE

## 2021-02-26 NOTE — Progress Notes (Signed)
Internal Medicine Clinic Attending ? ?Case discussed with Dr. Braswell  At the time of the visit.  We reviewed the resident?s history and exam and pertinent patient test results.  I agree with the assessment, diagnosis, and plan of care documented in the resident?s note.  ?

## 2021-03-02 ENCOUNTER — Encounter: Payer: Self-pay | Admitting: Internal Medicine

## 2021-03-09 ENCOUNTER — Encounter: Payer: 59 | Admitting: Student

## 2021-03-10 ENCOUNTER — Encounter: Payer: 59 | Admitting: Student

## 2021-03-22 ENCOUNTER — Telehealth: Payer: Self-pay | Admitting: Behavioral Health

## 2021-03-22 ENCOUNTER — Institutional Professional Consult (permissible substitution): Payer: 59 | Admitting: Behavioral Health

## 2021-03-22 NOTE — Telephone Encounter (Signed)
Unable to connect w/Pt today. Phone mailbox is full.  Dr. Monna Fam

## 2021-04-08 ENCOUNTER — Encounter: Payer: Self-pay | Admitting: Internal Medicine

## 2021-04-14 ENCOUNTER — Ambulatory Visit: Payer: Self-pay | Admitting: Behavioral Health

## 2021-04-14 DIAGNOSIS — F1120 Opioid dependence, uncomplicated: Secondary | ICD-10-CM

## 2021-04-14 DIAGNOSIS — F419 Anxiety disorder, unspecified: Secondary | ICD-10-CM

## 2021-04-14 DIAGNOSIS — F332 Major depressive disorder, recurrent severe without psychotic features: Secondary | ICD-10-CM

## 2021-04-14 NOTE — BH Specialist Note (Signed)
Integrated Behavioral Health via Telemedicine Visit  04/14/2021 ROCKY VITALI [id number]  Number of Integrated Behavioral Health visits: 1/6 Session Start time: 11:00am  Session End time: 11:30am Total time: 30  Referring Provider: Dr. Lucien Mons, MD Patient/Family location: Pt is @ home in private [facility] Provider location: [facility] Office All persons participating in visit: Pt & Clinician Types of Service: Individual psychotherapy  I connected with [place] and/or [place]akie A Gheen's  se  via  Telephone or Geologist, engineering  (Video is Caregility application) and verified that I am speaking with the correct person using two identifiers. Discussed confidentiality: Yes   I discussed the limitations of telemedicine and the availability of in person appointments.  Discussed there is a possibility of technology failure and discussed alternative modes of communication if that failure occurs.  I discussed that engaging in this telemedicine visit, they consent to the provision of behavioral healthcare and the services will be billed under their insurance.  Patient and/or legal guardian expressed understanding and consented to Telemedicine visit: Yes   Presenting Concerns: Patient and/or family reports the following symptoms/concerns: elevated anx/dep due to active IV heroin use since Dec 2022. Pt currently using 1/2g every 3 days. 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 Pt has his own transportation. Pt is aware heroin is damaging his heart.   Pt lives w/a Roommate who is not a substance user. Pt has a Father who lives in  [place] & is aware Son uses heroin. Pt does not visit Family, "as often as I should".  Goals Addressed: Patient will:  Reduce symptoms of: anxiety, depression, and stress   Increase knowledge and/or ability of: coping skills, healthy habits, self-management skills, stress reduction, and identification of triggering events & self-awareness re: substance use, be accountable to Roommate, Father, other Family members  & [facility] Providers promoting your best potential  Demonstrate ability to: Increase adequate support systems for patient/family, Increase motivation to adhere to plan of care, Improve medication compliance, and Decrease self-medicating behaviors & begin the process of healthy grieving over Mother's death in 2018/06/20 "Life has been downhill since then."  Progress towards Goals: Estb'd today-Last attempt to reach Pt his phone was cut off.   Interventions: Interventions utilized:  Solution-Focused Strategies, Behavioral Activation, and Supportive Counseling Standardized Assessments completed:  screeners prn - not in 30 min session  Patient and/or Family Response: Pt receptive to call & initially very difficult to understand his rapid & slurred speech. Pt admits to using this morning & that he uses 1/2g of IV heroin every 3 days  Assessment: Patient currently experiencing heavy IV heroin habit that he supports through picking up odd jobs.   Pt agreed to call [facility] & schedule his [facility] visit to get himself back on track. Pt estimates he has attempted to stop his habit 6-8 times. Commended Pt on his perserverance.  Related to Pt Clinician will hold him accountable today for the scheduling of his appt w/[facility] & his attendance @ future IBH telehealth sessions. Pt acknowledged.  Patient may benefit from accountability process for his own re-establishment of [place] & consistency of  visits. Pt attn & speech improved by  end of 30 min session today-praised him for  these efforts.  Plan: Follow up with behavioral health clinician on : 2 wks on telehealth for 30 min ck-in Behavioral recommendations: Make your return f/u appt w/[facility]. Answer my calls. Cont to prepare yourself to try again & be consistent. Referral(s): Integrated Behavioral Health Services (In Clinic) and Substance Abuse Program via [facility] or [facility]-Referral depending on his Ins coverage.  I discussed the assessment and treatment plan with the patient and/or parent/guardian. They were provided an opportunity to ask questions and all were answered. They agreed with the plan and demonstrated an understanding of the instructions.   They were advised to call back or seek an in-person evaluation if the symptoms worsen or if the condition fails to improve as anticipated.  Donnetta Hutching, LMFT

## 2021-05-09 ENCOUNTER — Inpatient Hospital Stay (HOSPITAL_COMMUNITY): Payer: Self-pay

## 2021-05-09 ENCOUNTER — Inpatient Hospital Stay (HOSPITAL_COMMUNITY)
Admission: EM | Admit: 2021-05-09 | Discharge: 2021-05-10 | DRG: 313 | Discharge status: Against Medical Advice | Payer: Self-pay | Attending: Family Medicine | Admitting: Family Medicine

## 2021-05-09 ENCOUNTER — Other Ambulatory Visit: Payer: Self-pay

## 2021-05-09 ENCOUNTER — Encounter (HOSPITAL_COMMUNITY): Payer: Self-pay | Admitting: Emergency Medicine

## 2021-05-09 DIAGNOSIS — Z8619 Personal history of other infectious and parasitic diseases: Secondary | ICD-10-CM

## 2021-05-09 DIAGNOSIS — R0789 Other chest pain: Principal | ICD-10-CM | POA: Diagnosis present

## 2021-05-09 DIAGNOSIS — B9561 Methicillin susceptible Staphylococcus aureus infection as the cause of diseases classified elsewhere: Secondary | ICD-10-CM | POA: Diagnosis present

## 2021-05-09 DIAGNOSIS — F1121 Opioid dependence, in remission: Secondary | ICD-10-CM | POA: Diagnosis present

## 2021-05-09 DIAGNOSIS — Z79899 Other long term (current) drug therapy: Secondary | ICD-10-CM

## 2021-05-09 DIAGNOSIS — I10 Essential (primary) hypertension: Secondary | ICD-10-CM | POA: Diagnosis present

## 2021-05-09 DIAGNOSIS — E871 Hypo-osmolality and hyponatremia: Secondary | ICD-10-CM | POA: Diagnosis present

## 2021-05-09 DIAGNOSIS — Z881 Allergy status to other antibiotic agents status: Secondary | ICD-10-CM

## 2021-05-09 DIAGNOSIS — F1120 Opioid dependence, uncomplicated: Secondary | ICD-10-CM | POA: Diagnosis present

## 2021-05-09 DIAGNOSIS — A419 Sepsis, unspecified organism: Secondary | ICD-10-CM | POA: Insufficient documentation

## 2021-05-09 DIAGNOSIS — Z8679 Personal history of other diseases of the circulatory system: Secondary | ICD-10-CM

## 2021-05-09 DIAGNOSIS — Z20822 Contact with and (suspected) exposure to covid-19: Secondary | ICD-10-CM | POA: Diagnosis present

## 2021-05-09 DIAGNOSIS — R079 Chest pain, unspecified: Secondary | ICD-10-CM | POA: Diagnosis present

## 2021-05-09 DIAGNOSIS — R6510 Systemic inflammatory response syndrome (SIRS) of non-infectious origin without acute organ dysfunction: Secondary | ICD-10-CM | POA: Diagnosis present

## 2021-05-09 DIAGNOSIS — Z8249 Family history of ischemic heart disease and other diseases of the circulatory system: Secondary | ICD-10-CM

## 2021-05-09 DIAGNOSIS — Z880 Allergy status to penicillin: Secondary | ICD-10-CM

## 2021-05-09 DIAGNOSIS — M7989 Other specified soft tissue disorders: Secondary | ICD-10-CM | POA: Diagnosis present

## 2021-05-09 LAB — CBC WITH DIFFERENTIAL/PLATELET
Abs Immature Granulocytes: 0.04 10*3/uL (ref 0.00–0.07)
Basophils Absolute: 0.1 10*3/uL (ref 0.0–0.1)
Basophils Relative: 1 %
Eosinophils Absolute: 0 10*3/uL (ref 0.0–0.5)
Eosinophils Relative: 0 %
HCT: 46.1 % (ref 39.0–52.0)
Hemoglobin: 15.1 g/dL (ref 13.0–17.0)
Immature Granulocytes: 0 %
Lymphocytes Relative: 10 %
Lymphs Abs: 1.1 10*3/uL (ref 0.7–4.0)
MCH: 28.4 pg (ref 26.0–34.0)
MCHC: 32.8 g/dL (ref 30.0–36.0)
MCV: 86.7 fL (ref 80.0–100.0)
Monocytes Absolute: 0.9 10*3/uL (ref 0.1–1.0)
Monocytes Relative: 8 %
Neutro Abs: 8.8 10*3/uL — ABNORMAL HIGH (ref 1.7–7.7)
Neutrophils Relative %: 81 %
Platelets: 311 10*3/uL (ref 150–400)
RBC: 5.32 MIL/uL (ref 4.22–5.81)
RDW: 14.3 % (ref 11.5–15.5)
WBC: 10.9 10*3/uL — ABNORMAL HIGH (ref 4.0–10.5)
nRBC: 0 % (ref 0.0–0.2)

## 2021-05-09 LAB — PROTIME-INR
INR: 1.1 (ref 0.8–1.2)
Prothrombin Time: 14.5 seconds (ref 11.4–15.2)

## 2021-05-09 LAB — C-REACTIVE PROTEIN: CRP: 11.9 mg/dL — ABNORMAL HIGH (ref <1.0)

## 2021-05-09 LAB — BASIC METABOLIC PANEL
Anion gap: 9 (ref 5–15)
BUN: 14 mg/dL (ref 6–20)
CO2: 28 mmol/L (ref 22–32)
Calcium: 9.2 mg/dL (ref 8.9–10.3)
Chloride: 96 mmol/L — ABNORMAL LOW (ref 98–111)
Creatinine, Ser: 0.94 mg/dL (ref 0.61–1.24)
GFR, Estimated: >60 mL/min (ref >60)
Glucose, Bld: 109 mg/dL — ABNORMAL HIGH (ref 70–99)
Potassium: 4 mmol/L (ref 3.5–5.1)
Sodium: 133 mmol/L — ABNORMAL LOW (ref 135–145)

## 2021-05-09 LAB — RESP PANEL BY RT-PCR (FLU A&B, COVID) ARPGX2
Influenza A by PCR: NEGATIVE
Influenza B by PCR: NEGATIVE
SARS Coronavirus 2 by RT PCR: NEGATIVE

## 2021-05-09 LAB — MAGNESIUM: Magnesium: 1.6 mg/dL — ABNORMAL LOW (ref 1.7–2.4)

## 2021-05-09 LAB — SEDIMENTATION RATE: Sed Rate: 30 mm/hr — ABNORMAL HIGH (ref 0–16)

## 2021-05-09 LAB — PHOSPHORUS: Phosphorus: 3 mg/dL (ref 2.5–4.6)

## 2021-05-09 LAB — LACTIC ACID, PLASMA: Lactic Acid, Venous: 1.8 mmol/L (ref 0.5–1.9)

## 2021-05-09 LAB — TROPONIN I (HIGH SENSITIVITY)
Troponin I (High Sensitivity): 6 ng/L (ref <18)
Troponin I (High Sensitivity): 5 ng/L (ref <18)

## 2021-05-09 LAB — APTT: aPTT: 36 seconds (ref 24–36)

## 2021-05-09 MED ORDER — NALOXONE HCL 0.4 MG/ML IJ SOLN
0.4000 mg | Freq: Once | INTRAMUSCULAR | Status: DC
  Filled 2021-05-09: qty 1

## 2021-05-09 MED ORDER — ONDANSETRON HCL 4 MG PO TABS: 4.0000 mg | ORAL_TABLET | Freq: Four times a day (QID) | ORAL | Status: DC | PRN

## 2021-05-09 MED ORDER — OXYCODONE HCL 5 MG PO TABS: 5.0000 mg | ORAL_TABLET | ORAL | Status: DC | PRN

## 2021-05-09 MED ORDER — ONDANSETRON HCL 4 MG/2ML IJ SOLN: 4.0000 mg | Freq: Four times a day (QID) | INTRAMUSCULAR | Status: DC | PRN

## 2021-05-09 MED ORDER — VANCOMYCIN HCL 1500 MG/300ML IV SOLN
1500.0000 mg | Freq: Once | INTRAVENOUS | Status: AC
  Administered 2021-05-09: 1500 mg via INTRAVENOUS
  Filled 2021-05-09: qty 300

## 2021-05-09 MED ORDER — MORPHINE SULFATE (PF) 2 MG/ML IV SOLN: 2.0000 mg | INTRAVENOUS | Status: DC | PRN

## 2021-05-09 MED ORDER — VANCOMYCIN HCL 1500 MG/300ML IV SOLN
1500.0000 mg | Freq: Once | INTRAVENOUS | Status: DC
  Filled 2021-05-09: qty 300

## 2021-05-09 MED ORDER — CIPROFLOXACIN IN D5W 400 MG/200ML IV SOLN
400.0000 mg | Freq: Once | INTRAVENOUS | Status: AC
  Administered 2021-05-09: 400 mg via INTRAVENOUS
  Filled 2021-05-09: qty 200

## 2021-05-09 MED ORDER — SODIUM CHLORIDE 0.9 % IV SOLN
2.0000 g | INTRAVENOUS | Status: DC
  Administered 2021-05-09: 2 g via INTRAVENOUS
  Filled 2021-05-09 (×2): qty 20

## 2021-05-09 MED ORDER — ACETAMINOPHEN 500 MG PO TABS
1000.0000 mg | ORAL_TABLET | Freq: Once | ORAL | Status: AC
  Administered 2021-05-09: 1000 mg via ORAL
  Filled 2021-05-09: qty 2

## 2021-05-09 MED ORDER — SODIUM CHLORIDE 0.9 % IV BOLUS
1000.0000 mL | Freq: Once | INTRAVENOUS | Status: AC
  Administered 2021-05-09: 1000 mL via INTRAVENOUS

## 2021-05-09 MED ORDER — DIPHENHYDRAMINE HCL 25 MG PO CAPS: 50.0000 mg | ORAL_CAPSULE | Freq: Four times a day (QID) | ORAL | Status: DC | PRN

## 2021-05-09 MED ORDER — DIPHENHYDRAMINE HCL 50 MG/ML IJ SOLN
25.0000 mg | Freq: Once | INTRAMUSCULAR | Status: AC
Start: 2021-05-09 — End: 2021-05-09
  Administered 2021-05-09: 25 mg via INTRAVENOUS
  Filled 2021-05-09: qty 1

## 2021-05-09 MED ORDER — VANCOMYCIN HCL 1500 MG/300ML IV SOLN
1500.0000 mg | Freq: Two times a day (BID) | INTRAVENOUS | Status: DC
  Administered 2021-05-10: 1500 mg via INTRAVENOUS
  Filled 2021-05-09 (×2): qty 300

## 2021-05-09 MED ORDER — ACETAMINOPHEN 650 MG RE SUPP: 650.0000 mg | Freq: Four times a day (QID) | RECTAL | Status: DC | PRN

## 2021-05-09 MED ORDER — ENOXAPARIN SODIUM 40 MG/0.4ML IJ SOSY
40.0000 mg | PREFILLED_SYRINGE | INTRAMUSCULAR | Status: DC
  Administered 2021-05-09 – 2021-05-10 (×2): 40 mg via SUBCUTANEOUS
  Filled 2021-05-09 (×2): qty 0.4

## 2021-05-09 MED ORDER — ACETAMINOPHEN 325 MG PO TABS: 650.0000 mg | ORAL_TABLET | Freq: Four times a day (QID) | ORAL | Status: DC | PRN

## 2021-05-09 MED ORDER — VANCOMYCIN HCL 500 MG/100ML IV SOLN
500.0000 mg | Freq: Once | INTRAVENOUS | Status: DC
  Filled 2021-05-09: qty 100

## 2021-05-09 MED ORDER — GENTAMICIN SULFATE 40 MG/ML IJ SOLN
1.0000 mg/kg | Freq: Once | INTRAVENOUS | Status: DC
  Filled 2021-05-09: qty 1.75

## 2021-05-09 NOTE — ED Notes (Signed)
PA at bedside discussing narcan w/ pt

## 2021-05-09 NOTE — ED Notes (Signed)
Pt adamant about not being administered narcan at this time. Notified PA. Pt having episodes of increased lethargy and dropping his O2 sats. Pupils pinpoint.

## 2021-05-09 NOTE — ED Notes (Signed)
Hives no longer present at IV site

## 2021-05-09 NOTE — ED Notes (Signed)
Pt's support person reports pt used IV drugs in the bathroom lobby before being brought back to a room. Pt endorses that he used last 3 hrs ago. Pt endorses meth and heroin use today

## 2021-05-09 NOTE — ED Notes (Signed)
Pt w/ localized allergic reaction at IV site (hives and itching). Cipro stopped and admitting MD and EDP notified.

## 2021-05-09 NOTE — ED Notes (Signed)
Admitting provider at bedside.

## 2021-05-09 NOTE — H&P (Addendum)
History and Physical    Cody Rios [id number] DOB: 09-28-1990 DOA: 05/09/2021  PCP: Mike Craze, DO  Patient coming from: Home  I have personally briefly reviewed patient's old medical records in [place]  Chief Complaint: Chest pain  HPI: Cody Rios is a 31 y.o. male with medical history significant of IV drug abuse, hepatitis C infection (treated in 2019), history of endocarditis of tricuspid valve present here with complaint of chest pain since 1 week.  Patient reports that he has left-sided chest pain since 1 week which is intermittent, dull achy type, nonradiating, no aggravating or relieving factors.  Chest pain started getting worse since 3 days associated with some lightheadedness, nausea and palpitations.  Had fever last night but did not check the temperature at home.  No leg swelling, orthopnea, PND, abdominal pain, vomiting, had 2 loose stools last night, denies melena, weight loss or night sweats.  No history of tobacco abuse, alcohol abuse however use IV drugs heroin and methamphetamine.  Last use prior to arrival.  ED Course: Upon arrival to ED: Patient had a fever of 101.2, tachycardic, maintaining oxygen saturation on room air, blood pressure 129/80, drowsy.  CBC shows leukocytosis of 10.9.  BMP, troponin, sed rate, blood culture all pending.  EKG shows sinus tachycardia.  Patient was given Narcan.  Patient started on IV fluids, IV Cipro, gentamicin and vancomycin for the possible endocarditis.  [facility] hospitalist consulted for admission for further evaluation and management.  Review of Systems: As per HPI otherwise negative.    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 He quit smokeless tobacco use about 2 years ago.  His smokeless tobacco use included chew. He reports that he does not drink alcohol and does not use drugs.  Allergies  Allergen Reactions   Cephalosporins Hives   Penicillins Hives, Shortness Of Breath and Rash    Did it involve swelling of the face/tongue/throat, SOB, or low BP? No Did it involve sudden or severe rash/hives, skin peeling, or any reaction on the inside of your mouth or nose? No Did you need to seek medical attention at a hospital or doctor's office? Unknown When did it last happen?      Childhood If all above answers are NO, may proceed with cephalosporin use.    Family History  Problem Relation Age of Onset   Hypertension Father     Prior to Admission medications   Medication Sig Start Date End Date Taking? Authorizing Provider  buprenorphine-naloxone (SUBOXONE) 8-2 mg SUBL SL tablet Place 2 tablets under the tongue daily. 02/23/21   Lucious Groves, DO    Physical Exam: Vitals:   05/09/21 1710 05/09/21 1715 05/09/21 1718 05/09/21 1719  BP: 126/88 127/84    Pulse: 100 99 (!) 115 (!) 103  Resp: 16 (!) _0 Temp:      TempSrc:  SpO2: 99% 100% (!) 89% 98%  Weight:      Height:        Constitutional: NAD, calm, comfortable, drowsy but arousable and answers appropriately, thin and weak and appears very sick.  On room air. Eyes: Pinpoint pupils conjunctivae normal ENMT: Mucous membranes are moist. Posterior pharynx clear of any exudate or lesions.Normal dentition.  Neck: normal, supple, no masses, no thyromegaly Respiratory: clear to auscultation bilaterally, no wheezing, no crackles. Normal respiratory effort. No  accessory muscle use.  Cardiovascular: Regular rate and rhythm, no murmurs / rubs / gallops. No extremity edema. 2+ pedal pulses. No carotid bruits.  Abdomen: no tenderness, no masses palpated. No hepatosplenomegaly. Bowel sounds positive.  Musculoskeletal: no clubbing / cyanosis. No joint deformity upper and lower extremities. Good ROM, no contractures. Normal muscle tone.  Skin: IV track marks noted on arms and left foot.   Neurologic: CN 2-12 grossly intact. Sensation intact, DTR normal. Strength 5/5 in all 4.  Psychiatric: Normal judgment and insight. Alert and oriented x 3. Normal mood.    Labs on Admission: I have personally reviewed following labs and imaging studies  CBC: Recent Labs  Lab 05/09/21 1625  WBC 10.9*  NEUTROABS 8.8*  HGB 15.1  HCT 46.1  MCV 86.7  PLT 893   Basic Metabolic Panel: No results for input(s): NA, K, CL, CO2, GLUCOSE, BUN, CREATININE, CALCIUM, MG, PHOS in the last 168 hours. GFR: CrCl cannot be calculated (Patient's most recent lab result is older than the maximum 21 days allowed.). Liver Function Tests: No results for input(s): AST, ALT, ALKPHOS, BILITOT, PROT, ALBUMIN in the last 168 hours. No results for input(s): LIPASE, AMYLASE in the last 168 hours. No results for input(s): AMMONIA in the last 168 hours. Coagulation Profile: No results for input(s): INR, PROTIME in the last 168 hours. Cardiac Enzymes: No results for input(s): CKTOTAL, CKMB, CKMBINDEX, TROPONINI in the last 168 hours. BNP (last 3 results) No results for input(s): PROBNP in the last 8760 hours. HbA1C: No results for input(s): HGBA1C in the last 72 hours. CBG: No results for input(s): GLUCAP in the last 168 hours. Lipid Profile: No results for input(s): CHOL, HDL, LDLCALC, TRIG, CHOLHDL, LDLDIRECT in the last 72 hours. Thyroid Function Tests: No results for input(s): TSH, T4TOTAL, FREET4, T3FREE, THYROIDAB in the last 72 hours. Anemia Panel: No results for input(s):  VITAMINB12, FOLATE, FERRITIN, TIBC, IRON, RETICCTPCT in the last 72 hours. Urine analysis:    Component Value Date/Time   COLORURINE AMBER (A) 11/09/2016 0545   APPEARANCEUR HAZY (A) 11/09/2016 0545   LABSPEC 1.024 11/09/2016 0545   PHURINE 5.0 11/09/2016 0545   GLUCOSEU NEGATIVE 11/09/2016 0545   HGBUR SMALL (A) 11/09/2016 0545   BILIRUBINUR NEGATIVE 11/09/2016 0545   KETONESUR NEGATIVE 11/09/2016 0545   PROTEINUR 100 (A) 11/09/2016 0545   NITRITE POSITIVE (A) 11/09/2016 0545   LEUKOCYTESUR NEGATIVE 11/09/2016 0545    Radiological Exams on Admission: No results found.  EKG: Independently reviewed.  Sinus tachycardia, probable left ventricular hypertrophy.  No acute ST-T wave changes noted.  Assessment/Plan Principal Problem:   Chest pain Active Problems:   Severe opioid use disorder (HCC)   Chest pain: -Likely in the setting of endocarditis?   -Has history of tricuspid endocarditis/severe TR/pulmonary hypertension and MSSA bacteremia -He met sepsis criteria on admission: has fever of 101.2, tachycardic, leukocytosis of 10.9.  Lactic acid: Pending -Patient started on IV fluids, IV gentamicin, vancomycin and Cipro -Blood culture is obtained and is pending.  BMP,  troponin: Pending -Admit patient on the floor.  On telemetry -will cont. Vancomycin, discontinue gentamicin and ciprofloxacin and added Rocephin -Check CRP, sed rate, chest x-ray,-all pending -We will get transthoracic echo -Consider ID and cardiology consult based on blood culture and ECHO result.  IV drug abuse: -Check urine drug screen -Has pinpoint pupil on exam.  Narcan given in the ED -Reports he used heroin and methamphetamine prior to arrival to the hospital -On seizure precautions, aspiration precautions -Monitor vitals  History of hepatitis C: Treated in the past   DVT prophylaxis: Lovenox Code Status: Full code Family Communication: Patient's boyfriend present at bedside.  Plan of care discussed  with patient in length and he verbalized understanding and agreed with it. Disposition Plan: To be determined Consults called: None Admission status: Inpatient   Mckinley Jewel MD [facility]  If 7PM-7AM, please contact night-coverage [email or web address]  05/09/2021, 5:44 PM

## 2021-05-09 NOTE — ED Provider Notes (Signed)
[facility] Provider Note   CSN: [id number] Arrival date & time: 05/09/21  1554     History  Chief Complaint  Patient presents with   Chest Pain    Cody Rios is a 31 y.o. male.  With past medical history of hypertension, opioid use disorder with heroin, methamphetamine and fentanyl, previous endocarditis and MSSA bacteremia, tricuspid regurgitation who presents to the emergency department with chest pain.  Patient states that for 3 days he has had tightness in his chest as well as sharp chest pain and shortness of breath.  States that he was sober for about 1.5 years and began using again in March 2019.  He states that he injects IV methamphetamine, heroin and fentanyl.  Last use was this afternoon.  He states that he uses about 0.25 g of heroin daily.  He is unsure if he has had fevers.  He does endorse lower extremity swelling which is chronic in nature.  Denies palpitations, abdominal pain, nausea or vomiting or symptoms of withdrawal at this time.   Chest Pain Associated symptoms: shortness of breath   Associated symptoms: no abdominal pain, no fever, no nausea, no palpitations and no vomiting       Home Medications Prior to Admission medications   Medication Sig Start Date End Date Taking? Authorizing Provider  buprenorphine-naloxone (SUBOXONE) 8-2 mg SUBL SL tablet Place 2 tablets under the tongue daily. 02/23/21   Lucious Groves, DO      Allergies    Cephalosporins and Penicillins    Review of Systems   Review of Systems  Constitutional:  Negative for fever.  Respiratory:  Positive for chest tightness and shortness of breath.   Cardiovascular:  Positive for chest pain and leg swelling. Negative for palpitations.  Gastrointestinal:  Negative for abdominal pain, nausea and vomiting.  Genitourinary:  Negative for dysuria.  All other systems reviewed and are negative.  Physical Exam Updated Vital Signs BP 129/80 (BP  Location: Right Arm)    Pulse 97    Temp 99.8 F (37.7 C) (Oral)    Resp 14    Ht 6\' 2"  (1.88 m)    Wt 74.8 kg    SpO2 94%    BMI 21.18 kg/m  Physical Exam Vitals and nursing note reviewed.  Constitutional:      General: He is not in acute distress.    Appearance: Normal appearance. He is ill-appearing.     Comments: Appears acutely high  HENT:     Head: Normocephalic and atraumatic.  Eyes:     General: No scleral icterus.    Extraocular Movements: Extraocular movements intact.     Pupils: Pupils are equal, round, and reactive to light.  Neck:     Vascular: No JVD.  Cardiovascular:     Rate and Rhythm: Regular rhythm. Tachycardia present.     Pulses:          Radial pulses are 2+ on the right side and 2+ on the left side.       Dorsalis pedis pulses are 1+ on the right side and 1+ on the left side.     Heart sounds: Murmur heard.  Systolic murmur is present.  Pulmonary:     Effort: Pulmonary effort is normal. No tachypnea or respiratory distress.     Breath sounds: Normal breath sounds.  Chest:     Chest wall: No tenderness.  Abdominal:     General: Bowel sounds are normal.  Palpations: Abdomen is soft.     Tenderness: There is no abdominal tenderness.  Musculoskeletal:        General: Normal range of motion.     Cervical back: Normal range of motion and neck supple.     Right lower leg: No tenderness. No edema.     Left lower leg: No tenderness. No edema.  Skin:    General: Skin is warm and dry.     Capillary Refill: Capillary refill takes less than 2 seconds.     Findings: No rash.     Comments: Double injection sites visible No Janeway lesions or Osler's nodes  Neurological:     General: No focal deficit present.     Mental Status: He is alert.  Psychiatric:        Mood and Affect: Mood normal.        Behavior: Behavior normal.    ED Results / Procedures / Treatments   Labs (all labs ordered are listed, but only abnormal results are displayed) Labs  Reviewed  BASIC METABOLIC PANEL - Abnormal; Notable for the following components:      Result Value   Sodium 133 (*)    Chloride 96 (*)    Glucose, Bld 109 (*)    All other components within normal limits  CBC WITH DIFFERENTIAL/PLATELET - Abnormal; Notable for the following components:   WBC 10.9 (*)    Neutro Abs 8.8 (*)    All other components within normal limits  SEDIMENTATION RATE - Abnormal; Notable for the following components:   Sed Rate 30 (*)    All other components within normal limits  CULTURE, BLOOD (ROUTINE X 2)  CULTURE, BLOOD (ROUTINE X 2)  RESP PANEL BY RT-PCR (FLU A&B, COVID) ARPGX2  LACTIC ACID, PLASMA  LACTIC ACID, PLASMA  RAPID URINE DRUG SCREEN, HOSP PERFORMED  CBC  C-REACTIVE PROTEIN  CBC  CREATININE, SERUM  MAGNESIUM  PHOSPHORUS  PROTIME-INR  APTT  COMPREHENSIVE METABOLIC PANEL  TROPONIN I (HIGH SENSITIVITY)  TROPONIN I (HIGH SENSITIVITY)   EKG EKG Interpretation  Date/Time:  Sunday May 09 2021 17:04:43 EST Ventricular Rate:  100 PR Interval:  118 QRS Duration: 96 QT Interval:  330 QTC Calculation: 426 R Axis:   89 Text Interpretation: Sinus tachycardia RSR' in V1 or V2, right VCD or RVH Probable left ventricular hypertrophy Confirmed by Regan Lemming ([id number]) on 05/09/2021 5:28:27 PM  Radiology No results found.  Procedures .Critical Care Performed by: Mickie Hillier, PA-C Authorized by: Mickie Hillier, PA-C   Critical care provider statement:    Critical care time (minutes):  40   Critical care time was exclusive of:  Separately billable procedures and treating other patients   Critical care was necessary to treat or prevent imminent or life-threatening deterioration of the following conditions:  Sepsis and cardiac failure (endocarditis)   Critical care was time spent personally by me on the following activities:  Development of treatment plan with patient or surrogate, discussions with consultants, discussions with primary  provider, evaluation of patient's response to treatment, examination of patient, interpretation of cardiac output measurements, review of old charts, re-evaluation of patient's condition, pulse oximetry, ordering and review of radiographic studies, ordering and review of laboratory studies, ordering and performing treatments and interventions and obtaining history from patient or surrogate   I assumed direction of critical care for this patient from another provider in my specialty: no     Care discussed with: admitting provider  Medications Ordered in ED Medications  naloxone (NARCAN) injection 0.4 mg (0 mg Intravenous Hold 05/09/21 1827)  vancomycin (VANCOREADY) IVPB 1500 mg/300 mL (has no administration in time range)  enoxaparin (LOVENOX) injection 40 mg (has no administration in time range)  acetaminophen (TYLENOL) tablet 650 mg (has no administration in time range)    Or  acetaminophen (TYLENOL) suppository 650 mg (has no administration in time range)  ondansetron (ZOFRAN) tablet 4 mg (has no administration in time range)    Or  ondansetron (ZOFRAN) injection 4 mg (has no administration in time range)  cefTRIAXone (ROCEPHIN) 2 g in sodium chloride 0.9 % 100 mL IVPB (has no administration in time range)  acetaminophen (TYLENOL) tablet 1,000 mg (1,000 mg Oral Given 05/09/21 1716)  sodium chloride 0.9 % bolus 1,000 mL (1,000 mLs Intravenous New Bag/Given 05/09/21 1728)  ciprofloxacin (CIPRO) IVPB 400 mg (0 mg Intravenous Stopped 05/09/21 1806)  diphenhydrAMINE (BENADRYL) injection 25 mg (25 mg Intravenous Given 05/09/21 1815)    ED Course/ Medical Decision Making/ A&P                           Medical Decision Making Amount and/or Complexity of Data Reviewed Labs: ordered. Radiology: ordered.  Risk OTC drugs. Prescription drug management. Decision regarding hospitalization.  Patient presents to the ED with complaints of chest pain. This involves an extensive number of treatment  options, and is a complaint that carries with it a high risk of complications and morbidity.   Additional history obtained:  Additional history obtained from: friend at bedside  External records from outside source obtained and reviewed including: previous admissions   EKG: EKG: normal EKG, normal sinus rhythm, left ventricular hypertrophy    Cardiac Monitoring: The patient was maintained on a cardiac monitor.  I personally viewed and interpreted the cardiac monitored which showed an underlying rhythm of: sinus tachycardia  Lab Results: I personally ordered, reviewed, and interpreted labs. Pertinent results include: CBC with mild leukocytosis to 10.9 Cultures pending Lactic 1.8 Troponin pending BMP without significant electrolyte derangement Sed rate 30  Imaging Studies ordered:  I ordered imaging studies which included x-ray.  I independently reviewed & interpreted imaging & am in agreement with radiology impression. Imaging shows:  Medications  I ordered medication including antibiotics, fluids for sepsis, narcan for somnolence, tylenol for fever Reevaluation of the patient after medication shows that patient stayed the same  Critical Interventions: Initiated antibiotics   Consultations: I requested consultation with the hospitalist, Dr. Doristine Bosworth at [phone],  and discussed lab and imaging findings as well as pertinent plan - they recommend: admission, endocarditis work up   ED Course: 31 year old male who presents to the emergency department with chest pain and fever.  EKG without ischemia or infarction, doubt ACS as a component of his symptoms at this time. No shortness of breath, does not appear fluid overloaded on exam, no history of heart failure, no lower extremity edema, doubt heart failure at this time.  Last EF 65 to 70%, although this was 4 years ago. Chest x-ray without pneumothorax Symptoms are not consistent with thoracic aortic dissection He does have a history of  MSSA bacteremia secondary to endocarditis in 2018.  He has current IV heroin use with now 3 days of chest pain, fever.  Symptoms are most consistent with possible endocarditis and subsequent bacteremia.  Not consistent with other etiologies of chest pain at this time.  Do not think sepsis is from other source  at this time. Sepsis protocol initiated and he was started on vancomycin, gentamicin, ciprofloxacin due to his allergies and current native valves.  He does have murmur on exam however unsure chronicity of this.  He does not have any Osler's nodes or Janeway lesions on exam. He was somewhat somnolent on exam with the nursing staff and had an episode of hypoxia.  Narcan was ordered.  On my evaluation he is alert, oriented and talking with me so we will hold for now.  Discussed case with Dr. Doristine Bosworth, hospitalist, who agrees for admission and endocarditis work-up at this time.  Patient is amendable.  After consideration of the diagnostic results and the patients response to treatment, I feel that the patent would benefit from admission. The patient has been appropriately medically screened and/or stabilized in the ED. I have low suspicion for any other emergent medical condition which would require further screening, evaluation or treatment in the ED or require inpatient management. The patient is overall well appearing and non-toxic in appearance. They are hemodynamically stable at time of discharge.   Final Clinical Impression(s) / ED Diagnoses Final diagnoses:  Other chest pain    Rx / DC Orders ED Discharge Orders     None         Mickie Hillier, PA-C 05/09/21 1839    Regan Lemming, MD 05/09/21 2053

## 2021-05-09 NOTE — ED Notes (Signed)
Called lab to have labs added to previous collection

## 2021-05-09 NOTE — ED Triage Notes (Signed)
Pt endorses left chest pain and swelling x1 week. Hx of endocarditis. Also having SOB. Endorses IV drug use.

## 2021-05-09 NOTE — ED Notes (Signed)
EDP at bedside to assess pt 

## 2021-05-09 NOTE — Progress Notes (Addendum)
Pharmacy Antibiotic Note  Cody Rios is a 31 y.o. male admitted on 05/09/2021 with possible endocarditis. Last positive culture in chart was 2018 blood culture for [facility]   Pharmacy has been consulted for vancomycin dosing.  Originally consulted for gentamicin and ciprofloxacin. Clarified with patient that his reactions to cephalosporins and PCN were as a baby. Patient is willing to re-trial now after education. Team agrees with change to vanc + ceftriaxone.   Since then, patient developed hives at PIV site where cipro was infusing. Hives resolved quickly with stopping medication and diphenhydramine PO 25mg  x1.   2/5 Vancomycin 1500mg  Q 12 hr Scr used: 0.94 mg/dL Weight: kg Vd coeff: 0.72 L/kg Est AUC: 492   Plan: Vancomycin 1500mg  x1 then 1500mg  Q12 hr Ceftriaxone 2g q24 hr Monitor cultures, clinical status, renal function, vancomycin level Narrow abx as able and f/u duration    Height: 6\' 2"  (188 cm) Weight: 74.8 kg (165 lb) IBW/kg (Calculated) : 82.2  Temp (24hrs), Avg:100.3 F (37.9 C), Min:99.8 F (37.7 C), Max:101.2 F (38.4 C)  Recent Labs  Lab 05/09/21 1625  WBC 10.9*    CrCl cannot be calculated (Patient's most recent lab result is older than the maximum 21 days allowed.).    Allergies  Allergen Reactions   Cephalosporins Hives   Penicillins Hives, Shortness Of Breath and Rash    Did it involve swelling of the face/tongue/throat, SOB, or low BP? No Did it involve sudden or severe rash/hives, skin peeling, or any reaction on the inside of your mouth or nose? No Did you need to seek medical attention at a hospital or doctor's office? Unknown When did it last happen?      Childhood If all above answers are NO, may proceed with cephalosporin use.    Antimicrobials this admission: Cipro 2/5 Vanc 2/5 >>  CTX 2/5 >>   Dose adjustments this admission: N/a  Microbiology results: 2/5 BCx: pend   Thank you for allowing pharmacy to be a part of  this patients care.  06.3, PharmD, BCPS, BCCP Clinical Pharmacist  Please check AMION for all [facility] Pharmacy phone numbers After 10:00 PM, call Main Pharmacy [phone]

## 2021-05-10 ENCOUNTER — Inpatient Hospital Stay (HOSPITAL_COMMUNITY): Payer: Self-pay

## 2021-05-10 DIAGNOSIS — I38 Endocarditis, valve unspecified: Secondary | ICD-10-CM

## 2021-05-10 LAB — COMPREHENSIVE METABOLIC PANEL
ALT: 18 U/L (ref 0–44)
AST: 21 U/L (ref 15–41)
Albumin: 3.2 g/dL — ABNORMAL LOW (ref 3.5–5.0)
Alkaline Phosphatase: 63 U/L (ref 38–126)
Anion gap: 9 (ref 5–15)
BUN: 13 mg/dL (ref 6–20)
CO2: 28 mmol/L (ref 22–32)
Calcium: 8.4 mg/dL — ABNORMAL LOW (ref 8.9–10.3)
Chloride: 97 mmol/L — ABNORMAL LOW (ref 98–111)
Creatinine, Ser: 0.83 mg/dL (ref 0.61–1.24)
GFR, Estimated: >60 mL/min (ref >60)
Glucose, Bld: 93 mg/dL (ref 70–99)
Potassium: 4.2 mmol/L (ref 3.5–5.1)
Sodium: 134 mmol/L — ABNORMAL LOW (ref 135–145)
Total Bilirubin: 0.5 mg/dL (ref 0.3–1.2)
Total Protein: 6.3 g/dL — ABNORMAL LOW (ref 6.5–8.1)

## 2021-05-10 LAB — CBC
HCT: 42.3 % (ref 39.0–52.0)
Hemoglobin: 13.7 g/dL (ref 13.0–17.0)
MCH: 28.1 pg (ref 26.0–34.0)
MCHC: 32.4 g/dL (ref 30.0–36.0)
MCV: 86.7 fL (ref 80.0–100.0)
Platelets: 175 10*3/uL (ref 150–400)
RBC: 4.88 MIL/uL (ref 4.22–5.81)
RDW: 14.4 % (ref 11.5–15.5)
WBC: 5.3 10*3/uL (ref 4.0–10.5)
nRBC: 0 % (ref 0.0–0.2)

## 2021-05-10 LAB — ECHOCARDIOGRAM COMPLETE
AR max vel: 3.93 cm2
AV Peak grad: 6.5 mmHg
Ao pk vel: 1.27 m/s
Area-P 1/2: 5.79 cm2
Calc EF: 53.4 %
Height: 74 in
S' Lateral: 3.2 cm
Single Plane A2C EF: 53.1 %
Single Plane A4C EF: 54.5 %
Weight: 2640 oz

## 2021-05-10 MED ORDER — LORAZEPAM 1 MG PO TABS
1.0000 mg | ORAL_TABLET | Freq: Once | ORAL | Status: AC
  Administered 2021-05-10: 1 mg via ORAL
  Filled 2021-05-10: qty 1

## 2021-05-10 MED ORDER — BUPRENORPHINE HCL-NALOXONE HCL 8-2 MG SL SUBL: 1.0000 | SUBLINGUAL_TABLET | Freq: Two times a day (BID) | SUBLINGUAL | Status: DC

## 2021-05-10 MED ORDER — BUPRENORPHINE HCL-NALOXONE HCL 2-0.5 MG SL SUBL
1.0000 | SUBLINGUAL_TABLET | SUBLINGUAL | Status: DC | PRN
  Administered 2021-05-10 (×2): 1 via SUBLINGUAL
  Filled 2021-05-10 (×2): qty 1

## 2021-05-10 NOTE — Plan of Care (Signed)
Problem: Education: °Goal: Knowledge of General Education information will improve °Description: Including pain rating scale, medication(s)/side effects and non-pharmacologic comfort measures °Outcome: Completed/Met °  °

## 2021-05-10 NOTE — Progress Notes (Signed)
PROGRESS NOTE    Cody SaxDustin A Rios  ZOX:[id number] DOB: 12/26/1990 DOA: 05/09/2021 PCP: Jaci Standardehman, Areeg N, DO   Brief Narrative:  Cody Rios is a 31 y.o. male with medical history significant of IV drug abuse, hepatitis C infection (treated in 2019), history of endocarditis of tricuspid valve presented with complaint of chest pain since 1 week.  No leg swelling or any shortness of breath but he did endorse having subjective fever at home and he did not check his temperature.   No history of tobacco abuse, alcohol abuse however use IV drugs heroin and methamphetamine.  Last use almost 2 to 3 hours prior to arrival.   Upon arrival to ED: Patient had a fever of 101.2, tachycardic, maintaining oxygen saturation on room air, blood pressure 129/80, drowsy.  CBC shows leukocytosis of 10.9.  Admitted under hospitalist service.  Assessment & Plan:   Principal Problem:   Chest pain Active Problems:   Severe opioid use disorder (HCC)  SIRS, presumed sepsis?:  Patient meets SIRS criteria based on tachycardia and fever of one 1.2.  No significant leukocytosis or lactic acidosis or hypoxia.  He has now remained afebrile.  So far the source of infection is not known however suspected to have bacteremia once again based on the fact that he has history of MSSA bacteremia in the past and he continues to use IV drugs.  Blood cultures pending and negative thus far.  Continue Rocephin and vancomycin and await final blood cultures as well as echo reports.  Chest pain: Resolved.  Troponin negative.  IV drug abuse/IV heroin: Patient's last use of IV heroin was around noon on 05/09/2021.  Patient does not seem to have any withdrawal symptoms however he is requesting to give him something for withdrawal.  I have initiated COWS/withdrawal symptom management per protocol.  History of hepatitis C: Treated in the past.  Mild hyponatremia: Monitor.  DVT prophylaxis: enoxaparin (LOVENOX) injection 40 mg Start:  05/09/21 1745 SCDs Start: 05/09/21 1742   Code Status: Full Code  Family Communication:  None present at bedside.  Plan of care discussed with patient in length and he/she verbalized understanding and agreed with it.  Status is: Inpatient Remains inpatient appropriate because: Awaiting blood culture and echo reports  Estimated body mass index is 21.18 kg/m as calculated from the following:   Height as of this encounter: 6\' 2"  (1.88 m).   Weight as of this encounter: 74.8 kg.    Nutritional Assessment: Body mass index is 21.18 kg/m.Marland Kitchen. Seen by dietician.  I agree with the assessment and plan as outlined below: Nutrition Status:        . Skin Assessment: I have examined the patient's skin and I agree with the wound assessment as performed by the wound care RN as outlined below:    Consultants:  None  Procedures:  None  Antimicrobials:  Anti-infectives (From admission, onward)    Start     Dose/Rate Route Frequency Ordered Stop   05/10/21 0900  vancomycin (VANCOREADY) IVPB 1500 mg/300 mL        1,500 mg 150 mL/hr over 120 Minutes Intravenous Every 12 hours 05/09/21 1939     05/09/21 2200  vancomycin (VANCOREADY) IVPB 1500 mg/300 mL        1,500 mg 150 mL/hr over 120 Minutes Intravenous  Once 05/09/21 2109 05/09/21 2331   05/09/21 2030  vancomycin (VANCOREADY) IVPB 500 mg/100 mL  Status:  Discontinued        500 mg 100  mL/hr over 60 Minutes Intravenous  Once 05/09/21 1935 05/09/21 2109   05/09/21 2000  cefTRIAXone (ROCEPHIN) 2 g in sodium chloride 0.9 % 100 mL IVPB        2 g 200 mL/hr over 30 Minutes Intravenous Every 24 hours 05/09/21 1805     05/09/21 1800  vancomycin (VANCOREADY) IVPB 1500 mg/300 mL  Status:  Discontinued        1,500 mg 150 mL/hr over 120 Minutes Intravenous  Once 05/09/21 1726 05/09/21 2236   05/09/21 1800  gentamicin (GARAMYCIN) 70 mg in dextrose 5 % 50 mL IVPB  Status:  Discontinued        1 mg/kg  74.8 kg 103.5 mL/hr over 30 Minutes  Intravenous  Once 05/09/21 1726 05/09/21 1805   05/09/21 1730  ciprofloxacin (CIPRO) IVPB 400 mg        400 mg 200 mL/hr over 60 Minutes Intravenous  Once 05/09/21 1726 05/09/21 1806         Subjective: Patient seen and examined.  He has no complaints.  Objective: Vitals:   05/09/21 1918 05/09/21 2017 05/10/21 0100 05/10/21 0400  BP: 122/86 117/62 109/69 112/71  Pulse:   80 73  Resp:   18 18  Temp: (!) 101.7 F (38.7 C)   97.8 F (36.6 C)  TempSrc: Oral   Oral  SpO2:   100% 100%  Weight:      Height:        Intake/Output Summary (Last 24 hours) at 05/10/2021 1019 Last data filed at 05/10/2021 0300 Gross per 24 hour  Intake 1530.11 ml  Output --  Net 1530.11 ml   Filed Weights   05/09/21 1602  Weight: 74.8 kg    Examination:  General exam: Appears calm and comfortable  Respiratory system: Clear to auscultation. Respiratory effort normal. Cardiovascular system: S1 & S2 heard, RRR. No JVD, murmurs, rubs, gallops or clicks. No pedal edema. Gastrointestinal system: Abdomen is nondistended, soft and nontender. No organomegaly or masses felt. Normal bowel sounds heard. Central nervous system: Alert and oriented. No focal neurological deficits. Extremities: Symmetric 5 x 5 power. Skin: No rashes, lesions or ulcers Psychiatry: Judgement and insight appear normal. Mood & affect appropriate.    Data Reviewed: I have personally reviewed following labs and imaging studies  CBC: Recent Labs  Lab 05/09/21 1625 05/10/21 0720  WBC 10.9* 5.3  NEUTROABS 8.8*  --   HGB 15.1 13.7  HCT 46.1 42.3  MCV 86.7 86.7  PLT 311 175   Basic Metabolic Panel: Recent Labs  Lab 05/09/21 1625 05/09/21 1741 05/10/21 0720  NA 133*  --  134*  K 4.0  --  4.2  CL 96*  --  97*  CO2 28  --  28  GLUCOSE 109*  --  93  BUN 14  --  13  CREATININE 0.94  --  0.83  CALCIUM 9.2  --  8.4*  MG  --  1.6*  --   PHOS  --  3.0  --    GFR: Estimated Creatinine Clearance: 135.2 mL/min (by C-G  formula based on SCr of 0.83 mg/dL). Liver Function Tests: Recent Labs  Lab 05/10/21 0720  AST 21  ALT 18  ALKPHOS 63  BILITOT 0.5  PROT 6.3*  ALBUMIN 3.2*   No results for input(s): LIPASE, AMYLASE in the last 168 hours. No results for input(s): AMMONIA in the last 168 hours. Coagulation Profile: Recent Labs  Lab 05/09/21 1741  INR 1.1   Cardiac Enzymes:  No results for input(s): CKTOTAL, CKMB, CKMBINDEX, TROPONINI in the last 168 hours. BNP (last 3 results) No results for input(s): PROBNP in the last 8760 hours. HbA1C: No results for input(s): HGBA1C in the last 72 hours. CBG: No results for input(s): GLUCAP in the last 168 hours. Lipid Profile: No results for input(s): CHOL, HDL, LDLCALC, TRIG, CHOLHDL, LDLDIRECT in the last 72 hours. Thyroid Function Tests: No results for input(s): TSH, T4TOTAL, FREET4, T3FREE, THYROIDAB in the last 72 hours. Anemia Panel: No results for input(s): VITAMINB12, FOLATE, FERRITIN, TIBC, IRON, RETICCTPCT in the last 72 hours. Sepsis Labs: Recent Labs  Lab 05/09/21 1628  LATICACIDVEN 1.8    Recent Results (from the past 240 hour(s))  Culture, blood (routine x 2)     Status: None (Preliminary result)   Collection Time: 05/09/21  4:52 PM   Specimen: BLOOD RIGHT ARM  Result Value Ref Range Status   Specimen Description BLOOD RIGHT ARM  Final   Special Requests   Final    BOTTLES DRAWN AEROBIC AND ANAEROBIC Blood Culture adequate volume   Culture   Final    NO GROWTH < 24 HOURS Performed at [facility], [address] [place], [place] [address]    Report Status PENDING  Incomplete  Resp Panel by RT-PCR (Flu A&B, Covid) Nasopharyngeal Swab     Status: None   Collection Time: 05/09/21  6:42 PM   Specimen: Nasopharyngeal Swab; Nasopharyngeal(NP) swabs in vial transport medium  Result Value Ref Range Status   SARS Coronavirus 2 by RT PCR NEGATIVE NEGATIVE Final    Comment: (NOTE) SARS-CoV-2 target nucleic acids are NOT  DETECTED.  The SARS-CoV-2 RNA is generally detectable in upper respiratory specimens during the acute phase of infection. The lowest concentration of SARS-CoV-2 viral copies this assay can detect is 138 copies/mL. A negative result does not preclude SARS-Cov-2 infection and should not be used as the sole basis for treatment or other patient management decisions. A negative result may occur with  improper specimen collection/handling, submission of specimen other than nasopharyngeal swab, presence of viral mutation(s) within the areas targeted by this assay, and inadequate number of viral copies(<138 copies/mL). A negative result must be combined with clinical observations, patient history, and epidemiological information. The expected result is Negative.  Fact Sheet for Patients:  BloggerCourse.com  Fact Sheet for Healthcare Providers:  SeriousBroker.it  This test is no t yet approved or cleared by the [place] FDA and  has been authorized for detection and/or diagnosis of SARS-CoV-2 by FDA under an Emergency Use Authorization (EUA). This EUA will remain  in effect (meaning this test can be used) for the duration of the COVID-19 declaration under Section 564(b)(1) of the Act, 21 U.S.C.section 360bbb-3(b)(1), unless the authorization is terminated  or revoked sooner.       Influenza A by PCR NEGATIVE NEGATIVE Final   Influenza B by PCR NEGATIVE NEGATIVE Final    Comment: (NOTE) The Xpert Xpress SARS-CoV-2/FLU/RSV plus assay is intended as an aid in the diagnosis of influenza from Nasopharyngeal swab specimens and should not be used as a sole basis for treatment. Nasal washings and aspirates are unacceptable for Xpert Xpress SARS-CoV-2/FLU/RSV testing.  Fact Sheet for Patients: BloggerCourse.com  Fact Sheet for Healthcare Providers: SeriousBroker.it  This test is not yet  approved or cleared by the [place] FDA and has been authorized for detection and/or diagnosis of SARS-CoV-2 by FDA under an Emergency Use Authorization (EUA). This EUA will remain in effect (  meaning this test can be used) for the duration of the COVID-19 declaration under Section 564(b)(1) of the Act, 21 U.S.C. section 360bbb-3(b)(1), unless the authorization is terminated or revoked.  Performed at [facility], [address], [place], [place] [address]   Culture, blood (routine x 2)     Status: None (Preliminary result)   Collection Time: 05/09/21  8:05 PM   Specimen: BLOOD LEFT HAND  Result Value Ref Range Status   Specimen Description BLOOD LEFT HAND  Final   Special Requests   Final    BOTTLES DRAWN AEROBIC AND ANAEROBIC Blood Culture adequate volume   Culture   Final    NO GROWTH < 12 HOURS Performed at [facility], [address] [place], [place] [address]    Report Status PENDING  Incomplete     Radiology Studies: DG Chest 2 View  Result Date: 05/09/2021 CLINICAL DATA:  Chest pain EXAM: CHEST - 2 VIEW COMPARISON:  11/16/2016 FINDINGS: The heart size and mediastinal contours are within normal limits. Both lungs are clear. The visualized skeletal structures are unremarkable. Cavitary lesions and air-fluid level seen on the prior examination have resolved in the interval. IMPRESSION: No active cardiopulmonary disease. Electronically Signed   By: Burman Nieves M.D.   On: 05/09/2021 18:46    Scheduled Meds:  [START ON 05/11/2021] buprenorphine-naloxone  1 tablet Sublingual BID   enoxaparin (LOVENOX) injection  40 mg Subcutaneous Q24H   naLOXone (NARCAN)  injection  0.4 mg Intravenous Once   Continuous Infusions:  cefTRIAXone (ROCEPHIN)  IV 2 g (05/09/21 2348)   vancomycin 1,500 mg (05/10/21 0922)     LOS: 1 day   Time spent: 33 minutes  Hughie Closs, MD [facility]  05/10/2021, 10:19 AM  Please page via Loretha Stapler and do not message via secure  chat for urgent patient care matters. Secure chat can be used for non urgent patient care matters.  How to contact the [facility] Attending or Consulting provider 7A - 7P or covering provider during after hours 7P -7A, for this patient?  Check the care team in [facility] and look for a) attending/consulting [facility] provider listed and b) the [facility] team listed. Page or secure chat 7A-7P. Log into [email or web address] and use [place]'s universal password to access. If you do not have the password, please contact the hospital operator. Locate the [facility] provider you are looking for under [facility] and page to a number that you can be directly reached. If you still have difficulty reaching the provider, please page the [facility] (Director on Call) for the Hospitalists listed on amion for assistance.

## 2021-05-10 NOTE — Progress Notes (Signed)
Pt COW score at 1600 at 16. Pt was given 1 tablet SL of suboxone 2/0.5 mg as PRN . Pt states  " I can't stay here another 4 hours, I just can't. Can I have an ativan to get me thru? " . Dr. Jacqulyn Bath updated via secure chat of what pt had stated.

## 2021-05-10 NOTE — Progress Notes (Addendum)
CSW received consult for substance use resources and community resources for patient. CSW spoke with patient at bedside. CSW offered patient outpatient substance use treatment services resources and [employer]. Patient accepted. All questions answered. No further questions reported at this time.

## 2021-05-10 NOTE — Progress Notes (Signed)
°   05/10/21 1500  Clinical Encounter Type  Visited With Patient not available  Visit Type Initial;Spiritual support  Referral From Physician  Consult/Referral To Chaplain   Spiritual Assessment requested by patient's physician. Spoke with patient's primary nurse before meeting with patient. Upon arrival patient and visitor lying in bed. Patient was not interested in meeting at this time. [employer], M.Min. [phone]

## 2021-05-10 NOTE — Progress Notes (Signed)
Patient room and belonging initiated after pt had given his verbal consent. Staff present and witnessing verbal consent were Milda Smart, NT, This RN Imaan Padgett  and Henrene Pastor.

## 2021-05-11 NOTE — Discharge Summary (Signed)
PatientPhysician Discharge Summary  QUITMAN GRENIER [id number] DOB: [birth year] DOA: 05/09/2021  PCP: Mike Craze, DO  Admit date: 05/09/2021 Discharge date: 05/11/2021 30 Day Unplanned Readmission Risk Score    Flowsheet Row ED to Hosp-Admission (Discharged) from 05/09/2021 in [facility]  30 Day Unplanned Readmission Risk Score (%) 9.77 Filed at 05/10/2021 1600       This score is the patient's risk of an unplanned readmission within 30 days of being discharged (0 -100%). The score is based on dignosis, age, lab data, medications, orders, and past utilization.   Low:  0-14.9   Medium: 15-21.9   High: 22-29.9   Extreme: 30 and above          Admitted From: Home Disposition: Left AGAINST MEDICAL ADVICE  Recommendations for Outpatient Follow-up:  Follow up with PCP in 1-2 weeks Please obtain BMP/CBC in one week Please follow up with your PCP on the following pending results: Unresulted Labs (From admission, onward)    None       Discharge Condition: Stable but at risk of getting worse. CODE STATUS: Full code  Subjective: Patient was seen and examined earlier on 05/10/2021.  He had no complaints.  He was requesting to start him on medication for heroin withdrawal however he was not having any withdrawal symptoms.  Brief/Interim Summary: PRIYAM WOODARDS is a 31 y.o. male with medical history significant of IV drug abuse, hepatitis C infection (treated in 2019), history of endocarditis of tricuspid valve presented with complaint of chest pain since 1 week.  No leg swelling or any shortness of breath but he did endorse having subjective fever at home and he did not check his temperature.   No history of tobacco abuse, alcohol abuse however use IV drugs heroin and methamphetamine.  Last use almost 2 to 3 hours prior to arrival.   Upon arrival to ED: Patient had a fever of 101.2, tachycardic, maintaining oxygen saturation on room air, blood pressure  129/80, drowsy.  CBC shows leukocytosis of 10.9.  Admitted under hospitalist service.   SIRS, presumed sepsis?:  Patient meets SIRS criteria based on tachycardia and fever of one 101.2.  No significant leukocytosis or lactic acidosis or hypoxia.   So far the source of infection is not known however suspected to have bacteremia once again based on the fact that he has history of MSSA bacteremia in the past and he continues to use IV drugs.  Blood cultures pending and negative thus far.  Plan was to continue Rocephin and vancomycin and await final blood cultures as well as echo reports.  When seen in the morning, I had discussed with him about not leaving [place] as that could be very detrimental and the consequences can be dangerous including death.  He verbalized understanding.  He requested to start him on some withdrawal medications although he was not withdrawing but we started him on COWS and based on that, on Suboxone as well.  In the afternoon around 3:30 PM, he demanded that he be given something for anxiety such as Xanax or Ativan or else he would leave [place].  Per his demand, he was given 1 dose of Ativan.  However it appears that patient had left AGAINST MEDICAL ADVICE after that which I was not informed of from nursing staff.  I am unsure as what time he left AGAINST MEDICAL ADVICE.   Chest pain: Resolved.  Troponin negative.   IV drug abuse/IV heroin: As  mentioned above.   History of hepatitis C: Treated in the past.   Mild hyponatremia: Monitor.  Discharge Diagnoses:  Principal Problem:   Chest pain Active Problems:   Severe opioid use disorder [facility])    Discharge Instructions   Allergies as of 05/10/2021       Reactions   Cephalosporins Hives   Per patient, got hives as a baby   Cipro [ciprofloxacin Hcl] Hives, Itching   Hives and itching locally at cipro IV site    Penicillins Hives, Shortness Of Breath, Rash   **Per patient, got hives as a  baby** Did it involve swelling of the face/tongue/throat, SOB, or low BP? No Did it involve sudden or severe rash/hives, skin peeling, or any reaction on the inside of your mouth or nose? No Did you need to seek medical attention at a hospital or doctor's office? Unknown When did it last happen?      Childhood If all above answers are "NO", may proceed with cephalosporin use.        Medication List     ASK your doctor about these medications    buprenorphine-naloxone 8-2 mg Subl SL tablet Commonly known as: SUBOXONE Place 2 tablets under the tongue daily.   ibuprofen 200 MG tablet Commonly known as: ADVIL Take 400-600 mg by mouth every 6 (six) hours as needed for headache or mild pain.        Allergies  Allergen Reactions   Cephalosporins Hives    Per patient, got hives as a baby   Cipro [Ciprofloxacin Hcl] Hives and Itching    Hives and itching locally at cipro IV site    Penicillins Hives, Shortness Of Breath and Rash    **Per patient, got hives as a baby** Did it involve swelling of the face/tongue/throat, SOB, or low BP? No Did it involve sudden or severe rash/hives, skin peeling, or any reaction on the inside of your mouth or nose? No Did you need to seek medical attention at a hospital or doctor's office? Unknown When did it last happen?      Childhood If all above answers are "NO", may proceed with cephalosporin use.    Consultations: None   Procedures/Studies: DG Chest 2 View  Result Date: 05/09/2021 CLINICAL DATA:  Chest pain EXAM: CHEST - 2 VIEW COMPARISON:  11/16/2016 FINDINGS: The heart size and mediastinal contours are within normal limits. Both lungs are clear. The visualized skeletal structures are unremarkable. Cavitary lesions and air-fluid level seen on the prior examination have resolved in the interval. IMPRESSION: No active cardiopulmonary disease. Electronically Signed   By: Lucienne Capers M.D.   On: 05/09/2021 18:46   ECHOCARDIOGRAM  COMPLETE  Result Date: 05/10/2021    ECHOCARDIOGRAM REPORT   Patient Name:   JONMICHAEL BELTRAME Date of Exam: 05/10/2021 Medical Rec #:  [id number]           Height:       74.0 in Accession #:    [id number]          Weight:       165.0 lb Date of Birth:  1990/11/27          BSA:          2.003 m Patient Age:    30 years            BP:           117/62 mmHg Patient Gender: M  HR:           89 bpm. Exam Location:  Inpatient Procedure: 2D Echo, Cardiac Doppler and Color Doppler Indications:    Endocarditis  History:        Patient has no prior history of Echocardiogram examinations.                 Endocarditis; Risk Factors:Hypertension. Prior TV endocarditis.  Sonographer:    Jyl Heinz Referring Phys: [id number] [place]  1. Left ventricular ejection fraction, by estimation, is 60 to 65%. The left ventricle has normal function. The left ventricle has no regional wall motion abnormalities. Left ventricular diastolic parameters are indeterminate.  2. Right ventricular systolic function is normal. The right ventricular size is normal.  3. The mitral valve is normal in structure. No evidence of mitral valve regurgitation. No evidence of mitral stenosis.  4. Tricuspid valve regurgitation is severe.  5. The aortic valve is normal in structure. Aortic valve regurgitation is not visualized. No aortic stenosis is present.  6. The inferior vena cava is normal in size with greater than 50% respiratory variability, suggesting right atrial pressure of 3 mmHg. Conclusion(s)/Recommendation(s): No evidence of valvular vegetations on this transthoracic echocardiogram. Consider a transesophageal echocardiogram to exclude infective endocarditis if clinically indicated. FINDINGS  Left Ventricle: Left ventricular ejection fraction, by estimation, is 60 to 65%. The left ventricle has normal function. The left ventricle has no regional wall motion abnormalities. The left ventricular internal cavity size  was normal in size. There is  no left ventricular hypertrophy. Left ventricular diastolic parameters are indeterminate. Right Ventricle: The right ventricular size is normal. No increase in right ventricular wall thickness. Right ventricular systolic function is normal. Left Atrium: Left atrial size was normal in size. Right Atrium: Right atrial size was normal in size. Pericardium: There is no evidence of pericardial effusion. Mitral Valve: The mitral valve is normal in structure. No evidence of mitral valve regurgitation. No evidence of mitral valve stenosis. Tricuspid Valve: The tricuspid valve is normal in structure. Tricuspid valve regurgitation is severe. No evidence of tricuspid stenosis. Aortic Valve: The aortic valve is normal in structure. Aortic valve regurgitation is not visualized. No aortic stenosis is present. Aortic valve peak gradient measures 6.5 mmHg. Pulmonic Valve: The pulmonic valve was normal in structure. Pulmonic valve regurgitation is not visualized. No evidence of pulmonic stenosis. Aorta: The aortic root is normal in size and structure. Venous: The inferior vena cava is normal in size with greater than 50% respiratory variability, suggesting right atrial pressure of 3 mmHg. IAS/Shunts: No atrial level shunt detected by color flow Doppler.  LEFT VENTRICLE PLAX 2D LVIDd:         4.70 cm      Diastology LVIDs:         3.20 cm      LV e' medial:    15.50 cm/s LV PW:         1.10 cm      LV E/e' medial:  4.2 LV IVS:        1.10 cm      LV e' lateral:   14.80 cm/s LVOT diam:     2.30 cm      LV E/e' lateral: 4.4 LV SV:         90 LV SV Index:   45 LVOT Area:     4.15 cm  LV Volumes (MOD) LV vol d, MOD A2C: 169.0 ml LV vol d, MOD A4C: 155.0  ml LV vol s, MOD A2C: 79.3 ml LV vol s, MOD A4C: 70.5 ml LV SV MOD A2C:     89.7 ml LV SV MOD A4C:     155.0 ml LV SV MOD BP:      87.0 ml RIGHT VENTRICLE             IVC RV Basal diam:  4.10 cm     IVC diam: 1.80 cm RV Mid diam:    3.00 cm RV S prime:      15.90 cm/s TAPSE (M-mode): 3.4 cm LEFT ATRIUM             Index        RIGHT ATRIUM           Index LA diam:        3.60 cm 1.80 cm/m   RA Area:     20.10 cm LA Vol (A2C):   52.2 ml 26.06 ml/m  RA Volume:   60.10 ml  30.01 ml/m LA Vol (A4C):   48.7 ml 24.32 ml/m LA Biplane Vol: 51.2 ml 25.56 ml/m  AORTIC VALVE AV Area (Vmax): 3.93 cm AV Vmax:        127.00 cm/s AV Peak Grad:   6.5 mmHg LVOT Vmax:      120.00 cm/s LVOT Vmean:     87.000 cm/s LVOT VTI:       0.217 m  AORTA Ao Root diam: 3.60 cm Ao Asc diam:  3.20 cm MITRAL VALVE               TRICUSPID VALVE MV Area (PHT): 5.79 cm    TR Peak grad:   23.8 mmHg MV Decel Time: 131 msec    TR Vmax:        244.00 cm/s MV E velocity: 65.10 cm/s MV A velocity: 56.70 cm/s  SHUNTS MV E/A ratio:  1.15        Systemic VTI:  0.22 m                            Systemic Diam: 2.30 cm Candee Furbish MD Electronically signed by Candee Furbish MD Signature Date/Time: 05/10/2021/1:20:11 PM    Final      Discharge Exam: Vitals:   05/10/21 0400 05/10/21 1626  BP: 112/71 136/87  Pulse: 73 (!) 111  Resp: 18 20  Temp: 97.8 F (36.6 C) 98.1 F (36.7 C)  SpO2: 100% 100%   Vitals:   05/09/21 2017 05/10/21 0100 05/10/21 0400 05/10/21 1626  BP: 117/62 109/69 112/71 136/87  Pulse:  80 73 (!) 111  Resp:  18 18 20   Temp:   97.8 F (36.6 C) 98.1 F (36.7 C)  TempSrc:   Oral Oral  SpO2:  100% 100% 100%  Weight:      Height:       I was not aware of him leaving AGAINST MEDICAL ADVICE, I was not able to examine him at the time of discharge.  Please refer to my examination from the progress note on the same date.   The results of significant diagnostics from this hospitalization (including imaging, microbiology, ancillary and laboratory) are listed below for reference.     Microbiology: Recent Results (from the past 240 hour(s))  Culture, blood (routine x 2)     Status: None (Preliminary result)   Collection Time: 05/09/21  4:52 PM   Specimen: BLOOD RIGHT ARM  Result  Value Ref Range Status  Specimen Description BLOOD RIGHT ARM  Final   Special Requests   Final    BOTTLES DRAWN AEROBIC AND ANAEROBIC Blood Culture adequate volume   Culture   Final    NO GROWTH < 24 HOURS Performed at [facility], [address] [place], [place] [address]    Report Status PENDING  Incomplete  Resp Panel by RT-PCR (Flu A&B, Covid) Nasopharyngeal Swab     Status: None   Collection Time: 05/09/21  6:42 PM   Specimen: Nasopharyngeal Swab; Nasopharyngeal(NP) swabs in vial transport medium  Result Value Ref Range Status   SARS Coronavirus 2 by RT PCR NEGATIVE NEGATIVE Final    Comment: (NOTE) SARS-CoV-2 target nucleic acids are NOT DETECTED.  The SARS-CoV-2 RNA is generally detectable in upper respiratory specimens during the acute phase of infection. The lowest concentration of SARS-CoV-2 viral copies this assay can detect is 138 copies/mL. A negative result does not preclude SARS-Cov-2 infection and should not be used as the sole basis for treatment or other patient management decisions. A negative result may occur with  improper specimen collection/handling, submission of specimen other than nasopharyngeal swab, presence of viral mutation(s) within the areas targeted by this assay, and inadequate number of viral copies(<138 copies/mL). A negative result must be combined with clinical observations, patient history, and epidemiological information. The expected result is Negative.  Fact Sheet for Patients:  EntrepreneurPulse.com.au  Fact Sheet for Healthcare Providers:  IncredibleEmployment.be  This test is no t yet approved or cleared by the [place] FDA and  has been authorized for detection and/or diagnosis of SARS-CoV-2 by FDA under an Emergency Use Authorization (EUA). This EUA will remain  in effect (meaning this test can be used) for the duration of the COVID-19 declaration under Section 564(b)(1) of the Act,  21 U.S.C.section 360bbb-3(b)(1), unless the authorization is terminated  or revoked sooner.       Influenza A by PCR NEGATIVE NEGATIVE Final   Influenza B by PCR NEGATIVE NEGATIVE Final    Comment: (NOTE) The Xpert Xpress SARS-CoV-2/FLU/RSV plus assay is intended as an aid in the diagnosis of influenza from Nasopharyngeal swab specimens and should not be used as a sole basis for treatment. Nasal washings and aspirates are unacceptable for Xpert Xpress SARS-CoV-2/FLU/RSV testing.  Fact Sheet for Patients: EntrepreneurPulse.com.au  Fact Sheet for Healthcare Providers: IncredibleEmployment.be  This test is not yet approved or cleared by the [place] FDA and has been authorized for detection and/or diagnosis of SARS-CoV-2 by FDA under an Emergency Use Authorization (EUA). This EUA will remain in effect (meaning this test can be used) for the duration of the COVID-19 declaration under Section 564(b)(1) of the Act, 21 U.S.C. section 360bbb-3(b)(1), unless the authorization is terminated or revoked.  Performed at [facility], [place] [address], [place],  [address]   Culture, blood (routine x 2)     Status: None (Preliminary result)   Collection Time: 05/09/21  8:05 PM   Specimen: BLOOD LEFT HAND  Result Value Ref Range Status   Specimen Description BLOOD LEFT HAND  Final   Special Requests   Final    BOTTLES DRAWN AEROBIC AND ANAEROBIC Blood Culture adequate volume   Culture   Final    NO GROWTH < 12 HOURS Performed at [facility], [place] [address], [place],  [address]    Report Status PENDING  Incomplete     Labs: BNP (last 3 results) No results for input(s): BNP in the  last 8760 hours. Basic Metabolic Panel: Recent Labs  Lab 05/09/21 1625 05/09/21 1741 05/10/21 0720  NA 133*  --  134*  K 4.0  --  4.2  CL 96*  --  97*  CO2 28  --  28  GLUCOSE 109*  --  93  BUN 14  --  13  CREATININE 0.94  --  0.83   CALCIUM 9.2  --  8.4*  MG  --  1.6*  --   PHOS  --  3.0  --    Liver Function Tests: Recent Labs  Lab 05/10/21 0720  AST 21  ALT 18  ALKPHOS 63  BILITOT 0.5  PROT 6.3*  ALBUMIN 3.2*   No results for input(s): LIPASE, AMYLASE in the last 168 hours. No results for input(s): AMMONIA in the last 168 hours. CBC: Recent Labs  Lab 05/09/21 1625 05/10/21 0720  WBC 10.9* 5.3  NEUTROABS 8.8*  --   HGB 15.1 13.7  HCT 46.1 42.3  MCV 86.7 86.7  PLT 311 175   Cardiac Enzymes: No results for input(s): CKTOTAL, CKMB, CKMBINDEX, TROPONINI in the last 168 hours. BNP: Invalid input(s): POCBNP CBG: No results for input(s): GLUCAP in the last 168 hours. D-Dimer No results for input(s): DDIMER in the last 72 hours. Hgb A1c No results for input(s): HGBA1C in the last 72 hours. Lipid Profile No results for input(s): CHOL, HDL, LDLCALC, TRIG, CHOLHDL, LDLDIRECT in the last 72 hours. Thyroid function studies No results for input(s): TSH, T4TOTAL, T3FREE, THYROIDAB in the last 72 hours.  Invalid input(s): FREET3 Anemia work up No results for input(s): VITAMINB12, FOLATE, FERRITIN, TIBC, IRON, RETICCTPCT in the last 72 hours. Urinalysis    Component Value Date/Time   COLORURINE AMBER (A) 11/09/2016 0545   APPEARANCEUR HAZY (A) 11/09/2016 0545   LABSPEC 1.024 11/09/2016 0545   PHURINE 5.0 11/09/2016 0545   GLUCOSEU NEGATIVE 11/09/2016 0545   HGBUR SMALL (A) 11/09/2016 0545   BILIRUBINUR NEGATIVE 11/09/2016 0545   KETONESUR NEGATIVE 11/09/2016 0545   PROTEINUR 100 (A) 11/09/2016 0545   NITRITE POSITIVE (A) 11/09/2016 0545   LEUKOCYTESUR NEGATIVE 11/09/2016 0545   Sepsis Labs Invalid input(s): PROCALCITONIN,  WBC,  LACTICIDVEN Microbiology Recent Results (from the past 240 hour(s))  Culture, blood (routine x 2)     Status: None (Preliminary result)   Collection Time: 05/09/21  4:52 PM   Specimen: BLOOD RIGHT ARM  Result Value Ref Range Status   Specimen Description BLOOD  RIGHT ARM  Final   Special Requests   Final    BOTTLES DRAWN AEROBIC AND ANAEROBIC Blood Culture adequate volume   Culture   Final    NO GROWTH < 24 HOURS Performed at [facility], [place] [address], [place], [place] [address]    Report Status PENDING  Incomplete  Resp Panel by RT-PCR (Flu A&B, Covid) Nasopharyngeal Swab     Status: None   Collection Time: 05/09/21  6:42 PM   Specimen: Nasopharyngeal Swab; Nasopharyngeal(NP) swabs in vial transport medium  Result Value Ref Range Status   SARS Coronavirus 2 by RT PCR NEGATIVE NEGATIVE Final    Comment: (NOTE) SARS-CoV-2 target nucleic acids are NOT DETECTED.  The SARS-CoV-2 RNA is generally detectable in upper respiratory specimens during the acute phase of infection. The lowest concentration of SARS-CoV-2 viral copies this assay can detect is 138 copies/mL. A negative result does not preclude SARS-Cov-2 infection and should not be used as the sole basis for treatment or other patient  management decisions. A negative result may occur with  improper specimen collection/handling, submission of specimen other than nasopharyngeal swab, presence of viral mutation(s) within the areas targeted by this assay, and inadequate number of viral copies(<138 copies/mL). A negative result must be combined with clinical observations, patient history, and epidemiological information. The expected result is Negative.  Fact Sheet for Patients:  EntrepreneurPulse.com.au  Fact Sheet for Healthcare Providers:  IncredibleEmployment.be  This test is no t yet approved or cleared by the [place] FDA and  has been authorized for detection and/or diagnosis of SARS-CoV-2 by FDA under an Emergency Use Authorization (EUA). This EUA will remain  in effect (meaning this test can be used) for the duration of the COVID-19 declaration under Section 564(b)(1) of the Act, 21 U.S.C.section 360bbb-3(b)(1), unless the  authorization is terminated  or revoked sooner.       Influenza A by PCR NEGATIVE NEGATIVE Final   Influenza B by PCR NEGATIVE NEGATIVE Final    Comment: (NOTE) The Xpert Xpress SARS-CoV-2/FLU/RSV plus assay is intended as an aid in the diagnosis of influenza from Nasopharyngeal swab specimens and should not be used as a sole basis for treatment. Nasal washings and aspirates are unacceptable for Xpert Xpress SARS-CoV-2/FLU/RSV testing.  Fact Sheet for Patients: EntrepreneurPulse.com.au  Fact Sheet for Healthcare Providers: IncredibleEmployment.be  This test is not yet approved or cleared by the [place] FDA and has been authorized for detection and/or diagnosis of SARS-CoV-2 by FDA under an Emergency Use Authorization (EUA). This EUA will remain in effect (meaning this test can be used) for the duration of the COVID-19 declaration under Section 564(b)(1) of the Act, 21 U.S.C. section 360bbb-3(b)(1), unless the authorization is terminated or revoked.  Performed at [facility], [place] [address], [place], [place] [address]   Culture, blood (routine x 2)     Status: None (Preliminary result)   Collection Time: 05/09/21  8:05 PM   Specimen: BLOOD LEFT HAND  Result Value Ref Range Status   Specimen Description BLOOD LEFT HAND  Final   Special Requests   Final    BOTTLES DRAWN AEROBIC AND ANAEROBIC Blood Culture adequate volume   Culture   Final    NO GROWTH < 12 HOURS Performed at [facility], [place] [address] [place], [place] [address]    Report Status PENDING  Incomplete     Time coordinating discharge: Over 30 minutes  SIGNED:   Darliss Cheney, MD  [facility] 05/11/2021, 8:00 AM  If 7PM-7AM, please contact night-coverage [email or web address]

## 2021-05-14 ENCOUNTER — Emergency Department (HOSPITAL_COMMUNITY)
Admission: EM | Admit: 2021-05-14 | Discharge: 2021-05-14 | Disposition: A | Discharge status: Home / Self Care | Payer: Self-pay | Attending: Emergency Medicine | Admitting: Emergency Medicine

## 2021-05-14 ENCOUNTER — Encounter (HOSPITAL_COMMUNITY): Payer: Self-pay | Admitting: Emergency Medicine

## 2021-05-14 ENCOUNTER — Emergency Department (HOSPITAL_COMMUNITY): Payer: Self-pay

## 2021-05-14 DIAGNOSIS — F1120 Opioid dependence, uncomplicated: Secondary | ICD-10-CM

## 2021-05-14 DIAGNOSIS — F1510 Other stimulant abuse, uncomplicated: Secondary | ICD-10-CM

## 2021-05-14 DIAGNOSIS — R0789 Other chest pain: Secondary | ICD-10-CM

## 2021-05-14 DIAGNOSIS — I38 Endocarditis, valve unspecified: Secondary | ICD-10-CM

## 2021-05-14 DIAGNOSIS — F1121 Opioid dependence, in remission: Secondary | ICD-10-CM | POA: Diagnosis present

## 2021-05-14 DIAGNOSIS — R002 Palpitations: Secondary | ICD-10-CM | POA: Diagnosis present

## 2021-05-14 DIAGNOSIS — A419 Sepsis, unspecified organism: Secondary | ICD-10-CM | POA: Diagnosis present

## 2021-05-14 DIAGNOSIS — R079 Chest pain, unspecified: Secondary | ICD-10-CM | POA: Insufficient documentation

## 2021-05-14 DIAGNOSIS — F1990 Other psychoactive substance use, unspecified, uncomplicated: Secondary | ICD-10-CM

## 2021-05-14 DIAGNOSIS — I10 Essential (primary) hypertension: Secondary | ICD-10-CM

## 2021-05-14 DIAGNOSIS — F1190 Opioid use, unspecified, uncomplicated: Secondary | ICD-10-CM | POA: Insufficient documentation

## 2021-05-14 LAB — BASIC METABOLIC PANEL
Anion gap: 9 (ref 5–15)
BUN: 13 mg/dL (ref 6–20)
CO2: 28 mmol/L (ref 22–32)
Calcium: 9.1 mg/dL (ref 8.9–10.3)
Chloride: 101 mmol/L (ref 98–111)
Creatinine, Ser: 0.86 mg/dL (ref 0.61–1.24)
GFR, Estimated: >60 mL/min (ref >60)
Glucose, Bld: 109 mg/dL — ABNORMAL HIGH (ref 70–99)
Potassium: 3.8 mmol/L (ref 3.5–5.1)
Sodium: 138 mmol/L (ref 135–145)

## 2021-05-14 LAB — CBC
HCT: 38.8 % — ABNORMAL LOW (ref 39.0–52.0)
Hemoglobin: 13.1 g/dL (ref 13.0–17.0)
MCH: 28.8 pg (ref 26.0–34.0)
MCHC: 33.8 g/dL (ref 30.0–36.0)
MCV: 85.3 fL (ref 80.0–100.0)
Platelets: 306 10*3/uL (ref 150–400)
RBC: 4.55 MIL/uL (ref 4.22–5.81)
RDW: 13.7 % (ref 11.5–15.5)
WBC: 6.6 10*3/uL (ref 4.0–10.5)
nRBC: 0 % (ref 0.0–0.2)

## 2021-05-14 LAB — TROPONIN I (HIGH SENSITIVITY): Troponin I (High Sensitivity): 3 ng/L (ref <18)

## 2021-05-14 LAB — CULTURE, BLOOD (ROUTINE X 2)
Culture: NO GROWTH
Culture: NO GROWTH
Special Requests: ADEQUATE
Special Requests: ADEQUATE

## 2021-05-14 NOTE — Consult Note (Signed)
Initial Consultation Note   Patient: Cody Rios [id number] DOB: 10/31/1990 PCP: Mike Craze, DO DOA: 05/14/2021 DOS: the patient was seen and examined on 05/14/2021 Primary service: Elnora Morrison, MD  Referring physician: Rosalyn Gess Reason for consult: Chest pain  Assessment/Plan: Assessment and Plan: No notes have been filed under this hospital service. Service: Hospitalist   TRH will sign off at present, please call us again when needed.  HPI: RASHAD PERLOW is a 31 y.o. male with past medical history of IV drug abuse with distant history of bacterial endocarditis (2018) who recently left hospital AMA on 05/10/21. Fortunately patient's blood cultures and echo are negative from that hospital stay. Discussed with ID and given negative imaging and cultures endocarditis is ruled out. Patient has notable chest pain and palpitations today after injecting methamphetamines. We discussed the high risk behavior and recommend discontinuation of IV drugs (both amphetamines and heroine). He is otherwise stable for discharge given negative troponin, EKG without overt ST changes. Recommend close follow up with PCP team for suboxone which he was previously getting outpatient in attempts to stop IV heroine use.   Review of Systems: As mentioned in the history of present illness. All other systems reviewed and are negative. 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 He quit smokeless tobacco use about 2 years ago.  His smokeless tobacco use included chew. He reports that he does not drink alcohol and does not use drugs.  Allergies  Allergen Reactions   Cephalosporins Hives    Per patient, got hives as a baby   Cipro [Ciprofloxacin Hcl] Hives and Itching    Hives and itching locally at cipro IV site    Penicillins Hives, Shortness Of Breath and Rash    **Per patient, got hives as a baby** Did it involve swelling of the face/tongue/throat, SOB, or low BP? No Did it involve sudden or severe rash/hives, skin peeling, or any reaction on the inside of your mouth or nose? No Did you need to seek medical attention at a hospital or doctor's office? Unknown When did it last happen?      Childhood If all above answers are "NO", may proceed with cephalosporin use.    Family History  Problem Relation Age of Onset   Hypertension Father     Prior to Admission medications   Medication Sig Start Date End Date Taking? Authorizing Provider  buprenorphine-naloxone (SUBOXONE) 8-2 mg SUBL SL tablet Place 2 tablets under the tongue daily. Patient not taking: Reported on 05/10/2021 02/23/21   Lucious Groves, DO  ibuprofen (ADVIL) 200 MG tablet Take 400-600 mg by mouth every 6 (six) hours as needed for headache or mild pain.    [provider]    Physical Exam: Vitals:   05/14/21 1545 05/14/21 1600 05/14/21 1615 05/14/21 1630  BP: (!) 128/93 (!) 134/94 (!) 128/93 127/88  Pulse: 67 60 65 (!) 59  Resp:  19 19 15 12   Temp:      TempSrc:      SpO2: 100% 98% 100% 100%   General:  Pleasantly resting in bed, No acute distress. HEENT:  Normocephalic atraumatic.  Sclerae nonicteric, noninjected.  Extraocular movements intact bilaterally. Neck:  Without mass or deformity.  Trachea is midline. Lungs:  Clear to auscultate bilaterally without rhonchi, wheeze, or rales. Heart:  Regular rate and rhythm.  Without murmurs,  rubs, or gallops. Abdomen:  Soft, nontender, nondistended.  Without guarding or rebound. Extremities: Notable previous injection sites Vascular:  Dorsalis pedis and posterior tibial pulses palpable bilaterally.  Primary team communication: Discussed with Dr Reather Converse Thank you very much for involving [place] in the care of your patient.   Holli Humbles DO 05/14/2021 4:46 PM

## 2021-05-14 NOTE — ED Provider Triage Note (Signed)
Emergency Medicine Provider Triage Evaluation Note  Cody Rios , a 31 y.o. male  was evaluated in triage.  Pt complains of chest pain ongoing since he left the hospital AMA 1 week ago where he was admitted for SIRS/sepsis concerns.  History of IV drug abuse, blood cultures negative from admission, echo completed on file.  Denies fevers, denies any new complaints or concerns r Review of Systems  Positive: Chest pain, shortness of breath at times Negative: Fever  Physical Exam  BP 139/90 (BP Location: Right Arm)    Pulse 77    Temp 98.7 F (37.1 C) (Oral)    Resp 16    SpO2 98%  Gen:   Awake, no distress   Resp:  Normal effort  MSK:   Moves extremities without difficulty  Other:  Abdomen soft and nontender  Medical Decision Making  Medically screening exam initiated at 12:34 PM.  Appropriate orders placed.  Tarek A Vawter was informed that the remainder of the evaluation will be completed by another provider, this initial triage assessment does not replace that evaluation, and the importance of remaining in the ED until their evaluation is complete.     Jeannie Fend, PA-C 05/14/21 1234

## 2021-05-14 NOTE — Discharge Instructions (Signed)
Follow-up as directed. Please seek help for IV drug use with resources. Return for fevers, shortness of breath or new concerns.

## 2021-05-14 NOTE — ED Provider Notes (Signed)
[place] EMERGENCY DEPARTMENT Provider Note   CSN: [id number] Arrival date & time: 05/14/21  1225     History  Chief Complaint  Patient presents with   Chest Pain    Cody Rios is a 31 y.o. male.  Patient presents with recurrent chest pain gradually worsening since leaving [place] in the hospital on February 6.  Patient has a history of endocarditis of tricuspid valve and presented to the hospital with fevers and chest pain and concerns for sepsis.  Patient last used heroin this morning.  Patient says he does want help and is willing to have IV antibiotics.  Patient at this time is willing to come back in the hospital for admission.  Nothing specific worsens chest pain.  No shortness of breath, no fever since being discharged.      Home Medications Prior to Admission medications   Medication Sig Start Date End Date Taking? Authorizing Provider  buprenorphine-naloxone (SUBOXONE) 8-2 mg SUBL SL tablet Place 2 tablets under the tongue daily. Patient not taking: Reported on 05/10/2021 02/23/21   Lucious Groves, DO  ibuprofen (ADVIL) 200 MG tablet Take 400-600 mg by mouth every 6 (six) hours as needed for headache or mild pain.    [provider]      Allergies    Cephalosporins, Cipro [ciprofloxacin hcl], and Penicillins    Review of Systems   Review of Systems  Constitutional:  Negative for chills and fever.  HENT:  Negative for congestion.   Eyes:  Negative for visual disturbance.  Respiratory:  Negative for shortness of breath.   Cardiovascular:  Positive for chest pain.  Gastrointestinal:  Negative for abdominal pain and vomiting.  Genitourinary:  Negative for dysuria and flank pain.  Musculoskeletal:  Negative for back pain, neck pain and neck stiffness.  Skin:  Negative for rash.  Neurological:  Negative for light-headedness and headaches.   Physical Exam Updated Vital Signs BP (!) 128/93    Pulse 67    Temp 98.7  F (37.1 C) (Oral)    Resp 19    SpO2 100%  Physical Exam Vitals and nursing note reviewed.  Constitutional:      General: He is not in acute distress.    Appearance: He is well-developed.  HENT:     Head: Normocephalic and atraumatic.     Mouth/Throat:     Mouth: Mucous membranes are moist.  Eyes:     General:        Right eye: No discharge.        Left eye: No discharge.     Conjunctiva/sclera: Conjunctivae normal.  Neck:     Trachea: No tracheal deviation.  Cardiovascular:     Rate and Rhythm: Normal rate and regular rhythm.     Heart sounds: Murmur heard.  Pulmonary:     Effort: Pulmonary effort is normal.     Breath sounds: Normal breath sounds.  Abdominal:     General: There is no distension.     Palpations: Abdomen is soft.     Tenderness: There is no abdominal tenderness. There is no guarding.  Musculoskeletal:        General: Normal range of motion.     Cervical back: Normal range of motion and neck supple. No rigidity.     Right lower leg: No edema.     Left lower leg: No edema.  Skin:    General: Skin is warm.     Capillary Refill: Capillary  refill takes less than 2 seconds.     Findings: No rash.     Comments: Multiple track marks on the forearms and AC area.  No signs of cellulitis or abscess in the arms.  No lesions on the feet appreciated.  Neurological:     General: No focal deficit present.     Mental Status: He is alert.     Cranial Nerves: No cranial nerve deficit.  Psychiatric:        Mood and Affect: Mood normal.    ED Results / Procedures / Treatments   Labs (all labs ordered are listed, but only abnormal results are displayed) Labs Reviewed  BASIC METABOLIC PANEL - Abnormal; Notable for the following components:      Result Value   Glucose, Bld 109 (*)    All other components within normal limits  CBC - Abnormal; Notable for the following components:   HCT 38.8 (*)    All other components within normal limits  TROPONIN I (HIGH  SENSITIVITY)    EKG EKG Interpretation  Date/Time:  Friday May 14 2021 12:31:51 EST Ventricular Rate:  76 PR Interval:  130 QRS Duration: 98 QT Interval:  384 QTC Calculation: 432 R Axis:   87 Text Interpretation: Normal sinus rhythm Incomplete right bundle branch block Borderline ECG When compared with ECG of 09-May-2021 17:04, PREVIOUS ECG IS PRESENT Confirmed by Elnora Morrison [phone]) on 05/14/2021 3:53:32 PM  Radiology DG Chest 2 View  Result Date: 05/14/2021 CLINICAL DATA:  CP EXAM: CHEST - 2 VIEW COMPARISON:  February 5, 23. FINDINGS: No consolidation. No visible pleural effusions or pneumothorax. Biapical pleuroparenchymal scarring. Cardiomediastinal silhouette is similar to prior and within normal limits. IMPRESSION: No active cardiopulmonary disease. Electronically Signed   By: Margaretha Sheffield M.D.   On: 05/14/2021 12:55    Procedures Procedures    Medications Ordered in ED Medications - No data to display  ED Course/ Medical Decision Making/ A&P                           Medical Decision Making  Patient with known history of IV drug use and endocarditis with recent hospitalization for concern for bacteremia or recurrent endocarditis presents for worsening chest pain.  Medical records reviewed from February 6 discussing IV antibiotics he received and admission, hospitalist recommended against discharge but he left [place].  Blood work repeated in the ER and reviewed showing normal white count, normal hemoglobin electrolytes unremarkable, no acute renal failure.  Troponin negative, patient low risk for ACS.  Chest x-ray ordered and reviewed independently no infiltrate, no significant cardiomegaly.  EKG reviewed sinus rhythm.  Paged hospitalist.  Medical records reviewed and blood cultures no growth from last admission, echo reviewed no signs of vegetation however significant tricuspid regurgitation appreciated.  Hospitalist discussed with patient  and infectious disease.  No indication for IV antibiotics admission at this time.  Outpatient follow-up.         Final Clinical Impression(s) / ED Diagnoses Final diagnoses:  Acute chest pain  IV drug user    Rx / DC Orders ED Discharge Orders     None         Elnora Morrison, MD 05/14/21 1714

## 2021-05-14 NOTE — ED Triage Notes (Signed)
Patient returns to ED with complaint of worsening chest pain after leaving from his inpatient admission for same last week. Patient alert, oriented, and in no apparent distress at this time.

## 2021-06-16 ENCOUNTER — Encounter: Payer: Self-pay | Admitting: Internal Medicine

## 2021-07-06 ENCOUNTER — Ambulatory Visit (INDEPENDENT_AMBULATORY_CARE_PROVIDER_SITE_OTHER): Payer: Self-pay | Admitting: Student

## 2021-07-06 ENCOUNTER — Other Ambulatory Visit: Payer: Self-pay

## 2021-07-06 ENCOUNTER — Encounter: Payer: Self-pay | Admitting: Student

## 2021-07-06 ENCOUNTER — Other Ambulatory Visit (HOSPITAL_COMMUNITY): Payer: Self-pay

## 2021-07-06 DIAGNOSIS — F1120 Opioid dependence, uncomplicated: Secondary | ICD-10-CM

## 2021-07-06 DIAGNOSIS — R222 Localized swelling, mass and lump, trunk: Secondary | ICD-10-CM | POA: Insufficient documentation

## 2021-07-06 MED ORDER — BUPRENORPHINE HCL-NALOXONE HCL 8-2 MG SL SUBL
2.0000 | SUBLINGUAL_TABLET | Freq: Every day | SUBLINGUAL | 0 refills | Status: AC
  Filled 2021-07-06: qty 14, 7d supply, fill #0

## 2021-07-06 NOTE — Assessment & Plan Note (Addendum)
Patient is here with his partner.  He appears well and in no acute distress.  He states that he is currently using IV heroin and IV methamphetamine multiple times a day.  States that he would like to get off of these IV medications and start Suboxone.  He is currently working multiple jobs.  ? ?Patient would like to have buprenorphine without naloxone for his induction.  Said that he has tried induction many times in the past but not successful.  I explained the risk of potentially using buprenorphine as an IV form and could overdose without naloxone.  Patient verbalized understanding.  I encouraged him to try harder this time to get through the withdrawal process.   ? ?We also went over the risk of IV drug use.  He said that he is using clean needles from our grains and not sharing needles. ? ?-Prescribed Suboxone 8 -2 mg tablet, 7 days supply ?-Return in 1 week ?-Will obtain tox assure at next visit ?

## 2021-07-06 NOTE — Progress Notes (Signed)
? ?  CC: O[facility] ? ?HPI: ? ?[name] is a 31 y.o. with history of tricuspid valve endocarditis and MSSA bacteremia 2/2 IV drug use who presented to the clinic for restarting Suboxone. ? ?Please see problem based charting for detail ? ?Past Medical History:  ?Diagnosis Date  ? 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 Hepatitis C infection 11/22/2016  ? Treated 2019  ? MSSA bacteremia 11/10/2016  ? Opioid use disorder, severe, on maintenance therapy (HCC) 11/22/2016  ? Severe tricuspid regurgitation 11/10/2016  ? ?Review of Systems:  per HPI ? ?Physical Exam: ? ?Vitals:  ? 07/06/21 1001  ?BP: (!) 142/78  ?Pulse: 86  ?Temp: 98.1 ?F (36.7 ?C)  ?TempSrc: Oral  ?SpO2: 98%  ?Weight: 172 lb 12.8 oz (78.4 kg)  ? ?Physical Exam ?Constitutional:   ?   Comments: Patient is fidgety.  Not maintaining eye contact.  ?HENT:  ?   Head: Normocephalic.  ?Eyes:  ?   General:     ?   Right eye: No discharge.     ?   Left eye: No discharge.  ?Cardiovascular:  ?   Rate and Rhythm: Normal rate and regular rhythm.  ?Pulmonary:  ?   Effort: Pulmonary effort is normal.  ?   Breath sounds: Normal breath sounds.  ?   Comments: This a prominent bony area palpated in the left lower chest with a soft palpable area beneath. ?Musculoskeletal:     ?   General: Normal range of motion.  ?Skin: ?   General: Skin is warm.  ?Neurological:  ?   General: No focal deficit present.  ?   Mental Status: He is alert.  ?  ? ?Assessment & Plan:  ? ?See Encounters Tab for problem based charting. ? ?Patient discussed with Dr. Mikey Bussing  ?

## 2021-07-06 NOTE — Assessment & Plan Note (Addendum)
He reports a palpable mass in his left lower chest that started in January.  The mass is not painful the mass is not painful but bothersome. ? ?Physical exam revealed a prominent bony area palpated in the left mid lower chest with a soft palpable area posteriorly.  The overlying skin is normal appearance.  He denies trauma or injecting drugs in the area. ? ?This appears like a lipoma.  No red flag features noted. ? ?-Will keep monitoring at every visit ?

## 2021-07-06 NOTE — Patient Instructions (Addendum)
? ?  Instruction for starting buprenorphine-naloxone (Suboxone) at home ? ?You should not mix buprenorphine-naloxone with other drugs especially large amounts of alcohol or benzodiazepines (Valium, Klonopin, Xanax, Ativan). If you have taken any of these medication, please tell your healthcare team and do not take buprenorphine-naloxone.  ? ?You must wait until you are feeling signs of withdrawal from opiates (heroin, pain pills) before you take buprenorphine-naloxone.  If you do not wait long enough the medication will make you sicker.  If you do take it too soon and get sicker then wait until later when you feel signs of withdrawal listed below and then try again.  ? ?Signs that you are withdrawing: ?Anxiety, restlessness, can?t sit still ?Aches ?Nausea or sick to your stomach ?Goose-bumps ?Racing heart  ? ?You should have ALL of these symptoms before you start taking your first dose of buprenorphine-naloxone. If you are not sure call your healthcare team.   ? ?When it?s time to take your first dose ?Split your pill or film in half ?Make sure your mouth is empty of everything (no candy/gum/etc) ?Sit or stand, but do not lie down ?Swallow a sip of water to wet your mouth  ?Put the half of the tablet or film under your tongue. Do not suck or swallow it. It must stay there until it is completely dissolved. Try to not even swallow your spit during this time. Anything that you swallow will not make you feel better.  ? ?In 20 minutes: You should start feeling a little better. If you feel worse then you started too early so you would wait a few hours and then try again later.   ? ?In one hour: You can take the other half of the pill or film the same way you took the first one.  ? ?In 2 hours: if you are still feeling symptoms of withdrawal listed above you can take another half a pill or film. You can repeat this if needed until you take a total of 2 pills or 2 films (16mg ). You may need less than this to control your  symptoms.  You should adjust your dose so that you are taking one and half or two pills or films per day (12-16mg  per day). At this dose you should have cut down on cravings and help with any withdrawal symptoms.  ? ?The next day:  In the morning you can take the same amount you took yesterday all at one time in the morning.  Expect a call from your team to see how you are doing.  ? ?If you have any questions or concerns at any time call your healthcare team. ? ?Clinic Number:  830am - 5pm: [phone] ?After Hours Number: [phone] - - Leave your number and expect a call back from a physician.  ? ? ?

## 2021-07-07 NOTE — Progress Notes (Signed)
Internal Medicine Clinic Attending  Case discussed with Dr. Nguyen  At the time of the visit.  We reviewed the resident's history and exam and pertinent patient test results.  I agree with the assessment, diagnosis, and plan of care documented in the resident's note. 

## 2021-07-08 ENCOUNTER — Telehealth: Payer: Self-pay | Admitting: *Deleted

## 2021-07-08 NOTE — Telephone Encounter (Addendum)
Call from pt, talking very fast, difficult to understand what's he trying to tell me. Then his partner got on the phone. Stated they picked up Suboxone today about 2-3 hrs ago; then went to the gas station, left the windows down and believes someone had stolen the medication. He stated pt is going thru withdrawal. Informed ,unfortunately, we are unable to replace med unless there's a police report. Stated he understands. ? ?

## 2021-07-08 NOTE — Telephone Encounter (Signed)
Thank you. That is correct, we do not refill lost or stolen suboxone as policy. We can talk about it with him his next [facility] visit and retry treatment. ?

## 2021-07-13 ENCOUNTER — Encounter: Payer: Self-pay | Admitting: Student

## 2021-07-20 ENCOUNTER — Telehealth: Payer: Self-pay | Admitting: Internal Medicine

## 2021-07-20 NOTE — Telephone Encounter (Signed)
Pt cancelled his appointment stating he is court and can not come.   ? ?Pt states he only has one pill left and needs a refill. ? ? ?

## 2021-07-21 NOTE — Telephone Encounter (Signed)
Patient calling back today for refill on suboxone. R/s appt to 4/25 at 0945 in [facility]. ?

## 2021-07-22 ENCOUNTER — Other Ambulatory Visit (HOSPITAL_COMMUNITY): Payer: Self-pay

## 2021-07-22 ENCOUNTER — Telehealth: Payer: Self-pay | Admitting: *Deleted

## 2021-07-22 DIAGNOSIS — F1120 Opioid dependence, uncomplicated: Secondary | ICD-10-CM

## 2021-07-22 MED ORDER — BUPRENORPHINE HCL-NALOXONE HCL 8-2 MG SL SUBL
1.0000 | SUBLINGUAL_TABLET | Freq: Two times a day (BID) | SUBLINGUAL | 0 refills | Status: DC
  Filled 2021-07-22: qty 14, 7d supply, fill #0

## 2021-07-22 NOTE — Telephone Encounter (Signed)
Called pt - no answer; mailbox full, unable to leave a message. 

## 2021-07-22 NOTE — Telephone Encounter (Signed)
Call from pt requesting refill on Suboxone. Appt 4/18 was canceled b/c he had to got to court. But has been rescheduled to 4/25 at 0945 AM in OUD. Stated he needs a refill today. Send rx to [facility] pharmacy. ?Thanks ?

## 2021-07-22 NOTE — Telephone Encounter (Signed)
Please tell patient to keep his appointment for further refills and next time to allow 48 hours for refill requests. Thanks.  ?

## 2021-07-27 ENCOUNTER — Ambulatory Visit (INDEPENDENT_AMBULATORY_CARE_PROVIDER_SITE_OTHER): Payer: Self-pay | Admitting: Student

## 2021-07-27 ENCOUNTER — Encounter: Payer: Self-pay | Admitting: Student

## 2021-07-27 ENCOUNTER — Other Ambulatory Visit (HOSPITAL_COMMUNITY): Payer: Self-pay

## 2021-07-27 VITALS — BP 133/73 | HR 76 | Temp 98.3°F | Wt 166.5 lb

## 2021-07-27 DIAGNOSIS — F1120 Opioid dependence, uncomplicated: Secondary | ICD-10-CM

## 2021-07-27 DIAGNOSIS — I330 Acute and subacute infective endocarditis: Secondary | ICD-10-CM

## 2021-07-27 MED ORDER — BUPRENORPHINE HCL-NALOXONE HCL 8-2 MG SL SUBL
1.0000 | SUBLINGUAL_TABLET | Freq: Two times a day (BID) | SUBLINGUAL | 0 refills | Status: DC
  Filled 2021-07-28: qty 14, 7d supply, fill #0

## 2021-07-27 NOTE — Patient Instructions (Signed)
Mr. Loberg, ? ?It was nice seeing you in the clinic today.  I am glad you are doing well with Suboxone and stopping heroin. ? ?Please try to cut back on IV methamphetamine.  At least try to stop using injecting drugs. ? ?We will give you a packet for application for orange card and CAFA letter.  This will help cover for some medication and specialist such as cardiology. ? ?Please return in 1 week ? ?Dr. Cyndie Chime ?

## 2021-07-27 NOTE — Progress Notes (Signed)
? ?CC: Suboxone induction f/u ? ?HPI: ? ?[name] is a 31 y.o. with past medical history of IV drug use, endocarditis, opioid use disorder who is here to follow-up on his last Suboxone induction. ? ?Please see problem based charting for detail. ? ?Past Medical History:  ?Diagnosis Date  ? 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 Hepatitis C infection 11/22/2016  ? Treated 2019  ? MSSA bacteremia 11/10/2016  ? Opioid use disorder, severe, on maintenance therapy (HCC) 11/22/2016  ? Severe tricuspid regurgitation 11/10/2016  ? ?Review of Systems: Per HPI ? ?Physical Exam: ? ?Vitals:  ? 07/27/21 1014  ?BP: 133/73  ?Pulse: 76  ?Temp: 98.3 ?F (36.8 ?C)  ?TempSrc: Oral  ?SpO2: 97%  ?Weight: 166 lb 8 oz (75.5 kg)  ? ?Physical Exam ?Constitutional:   ?   General: He is not in acute distress. ?   Appearance: He is not toxic-appearing.  ?   Comments: Talking fast.  Fidgety.  ?Eyes:  ?   General:     ?   Right eye: No discharge.     ?   Left eye: No discharge.  ?Cardiovascular:  ?   Rate and Rhythm: Normal rate and regular rhythm.  ?Pulmonary:  ?   Effort: Pulmonary effort is normal. No respiratory distress.  ?   Breath sounds: Normal breath sounds.  ?Musculoskeletal:     ?   General: Normal range of motion.  ?Skin: ?   General: Skin is warm.  ?Neurological:  ?   Mental Status: He is alert. Mental status is at baseline.  ?Psychiatric:     ?   Mood and Affect: Mood normal.     ?   Behavior: Behavior normal.  ?  ? ?Assessment & Plan:  ? ?See Encounters Tab for problem based charting. ? ?Severe opioid use disorder (HCC) ?Patient was started on Suboxone induction on 07/05/2021.  He reported that his medication was stolen.  However today he states that he found his medication and was taking 1/2 to 1 tablet a day.  He just refill his Suboxone 3 days ago and has been taking 2 tablets daily.  Reports doing well with the current dose.  Denies any craving.  He has stopped using IV heroine about 2 weeks ago.  Denies any side  effects from Suboxone. ? ?Patient still using IV methamphetamine.  Last use was this morning.  We talked about the risk of IV drug use and his history of endocarditis.  I counseled patient on trying to taper down methamphetamine or at least stop the IV form. ? ?He is currently living between homes but mostly stay with his family.  Report safe living environment.  [place] that he is still with his partner who is very supportive of him stopping IV drug use. ? ?-Tox assure today ?-Refill 1 week of Suboxone 8-2 mg sublingual tablets ?-Follow-up in 1 week  ? ?Endocarditis ?History of IV drug use, bacteremia and endocarditis in 2018.  He was admitted in February for fever and chest pain.  A TTE was performed which did not show evidence of endocarditis.  A TEE was recommended but patient left AMA before that.  Patient reports having 9 days of IV antibiotic and a couple weeks of oral antibiotic after discharge. ? ?He reports doing well, denies fever, chill or back pain.  Denies any shortness of breath. ? ?Reported using clean needles every time and does not share needles. ? ?Patient is high risk for bacteremia and  endocarditis with his current IV drug use.  I counseled him on stopping. ? ?Ultimately he will need a cardiology referral for his severe TR.  He is currently self-pay.  I gave him a packet to apply for a CAFA letter and orange card today.  ? ?Patient discussed with Dr.  Lafonda Mosses   ?
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
## 2021-07-27 NOTE — Assessment & Plan Note (Signed)
History of IV drug use, bacteremia and endocarditis in 2018.  He was admitted in February for fever and chest pain.  A TTE was performed which did not show evidence of endocarditis.  A TEE was recommended but patient left AMA before that.  Patient reports having 9 days of IV antibiotic and a couple weeks of oral antibiotic after discharge. ? ?He reports doing well, denies fever, chill or back pain.  Denies any shortness of breath. ? ?Reported using clean needles every time and does not share needles. ? ?Patient is high risk for bacteremia and endocarditis with his current IV drug use.  I counseled him on stopping. ? ?Ultimately he will need a cardiology referral for his severe TR.  He is currently self-pay.  I gave him a packet to apply for a CAFA letter and orange card today. ?

## 2021-07-27 NOTE — Progress Notes (Signed)
Internal Medicine Clinic Attending ? ?Case discussed with Dr. Alfonse Spruce  At the time of the visit.  We reviewed the resident?s history and exam and pertinent patient test results.  I agree with the assessment, diagnosis, and plan of care documented in the resident?s note.  ? ?I'm really happy that Cody Rios is here today. ?Agree with plan to refill Suboxone 8-2 TID x 1 week supply today, with in-person follow-up again in 1 week. Close follow-up needed since he just underwent induction and is high risk right now.  ?Agree with counseling to stop IV amphetamine use - patient will work on this, and we'll address each visit.  ? ? ?

## 2021-07-28 ENCOUNTER — Other Ambulatory Visit (HOSPITAL_COMMUNITY): Payer: Self-pay

## 2021-07-29 ENCOUNTER — Other Ambulatory Visit (HOSPITAL_COMMUNITY): Payer: Self-pay

## 2021-08-01 LAB — TOXASSURE SELECT,+ANTIDEPR,UR

## 2021-08-04 ENCOUNTER — Encounter: Payer: Self-pay | Admitting: Student

## 2021-08-10 ENCOUNTER — Other Ambulatory Visit: Payer: Self-pay

## 2021-08-10 DIAGNOSIS — F1120 Opioid dependence, uncomplicated: Secondary | ICD-10-CM

## 2021-08-13 ENCOUNTER — Other Ambulatory Visit (HOSPITAL_COMMUNITY): Payer: Self-pay

## 2021-08-16 ENCOUNTER — Encounter: Payer: Self-pay | Admitting: Student

## 2021-08-18 ENCOUNTER — Encounter: Payer: Self-pay | Admitting: Internal Medicine

## 2021-09-07 ENCOUNTER — Ambulatory Visit (INDEPENDENT_AMBULATORY_CARE_PROVIDER_SITE_OTHER): Payer: Self-pay | Admitting: Internal Medicine

## 2021-09-07 ENCOUNTER — Other Ambulatory Visit: Payer: Self-pay

## 2021-09-07 ENCOUNTER — Encounter: Payer: Self-pay | Admitting: Internal Medicine

## 2021-09-07 ENCOUNTER — Other Ambulatory Visit (HOSPITAL_COMMUNITY): Payer: Self-pay

## 2021-09-07 DIAGNOSIS — F1121 Opioid dependence, in remission: Secondary | ICD-10-CM

## 2021-09-07 DIAGNOSIS — F1120 Opioid dependence, uncomplicated: Secondary | ICD-10-CM

## 2021-09-07 MED ORDER — BUPRENORPHINE HCL-NALOXONE HCL 8-2 MG SL SUBL
1.0000 | SUBLINGUAL_TABLET | Freq: Two times a day (BID) | SUBLINGUAL | 0 refills | Status: DC
  Filled 2021-09-07: qty 28, 14d supply, fill #0

## 2021-09-07 NOTE — Progress Notes (Signed)
   Office Visit   Patient ID: Cody Rios, male    DOB: April 29, 1990, 31 y.o.   MRN: [id number]   PCP: Jaci Standard, DO   Subjective:  CC: Follow-up (FOLLOW UP FOR MISSED APPOINTMENT / MEDICATION REFILL /)   Cody Rios is a 31 y.o. year old male who presents for the above medical condition(s). Please refer to problem based charting for assessment and plan.    Objective:   BP 134/84 (BP Location: Right Arm, Patient Position: Sitting, Cuff Size: Normal)   Pulse 100   Temp 98.1 F (36.7 C) (Oral)   Ht 6\' 2"  (1.88 m)   Wt 164 lb 1.6 oz (74.4 kg)   SpO2 99%   BMI 21.07 kg/m   General: thin appearing male Cardiac: RRR Psych: hyperactive, rapid speech, fidgety, makes intermittent eye contact. No SI Assessment & Plan:   Problem List Items Addressed This Visit       Other   Severe opioid use disorder, in early remission (HCC)    Induction date: 07/05/21 Substance hx: IV heroin (not used since early April) Active Substance use: IV amphetamines  LOV was July 27, 2021 at which time he admitted to ongoing IV amphetamine use. A 1 week prescription was sent to the pharmacy and he was instructed to follow up the following week.   He did not return to the office until today. He denies illicit heroin use in the interm. He has been intermittently buying suboxone films off the street. He felt like his cravings were well controlled on the 8-2mg  BID suboxone regimen that he had been previously prescribed. Admits to persistent IV amphetamine use although feels like he has reduced his use since April.   Plan --2w supply of Suboxone 8-2mg  tablets BID sent to the pharmacy --F/u in 2 weeks --Encourage ongoing reduction of amphetamine reduction --If unable to follow consistently in our clinic for routine OUD/suboxone monitoring, he may benefit from a more structured program in the future         Relevant Medications   buprenorphine-naloxone (SUBOXONE) 8-2 mg SUBL SL tablet    Other Visit Diagnoses     Opioid use disorder, severe, on maintenance therapy (HCC)       Relevant Medications   buprenorphine-naloxone (SUBOXONE) 8-2 mg SUBL SL tablet        Return in about 2 weeks (around 09/21/2021).   Pt discussed with Dr. 09/23/2021, MD Internal Medicine Resident PGY-3 [facility] 09/07/2021 4:55 PM

## 2021-09-07 NOTE — Assessment & Plan Note (Addendum)
Induction date: 07/05/21 Substance hx: IV heroin (not used since early April) Active Substance use: IV amphetamines  LOV was July 27, 2021 at which time he admitted to ongoing IV amphetamine use. A 1 week prescription was sent to the pharmacy and he was instructed to follow up the following week.   He did not return to the office until today. He denies illicit heroin use in the interm. He has been intermittently buying suboxone films off the street. He felt like his cravings were well controlled on the 8-2mg  BID suboxone regimen that he had been previously prescribed. Admits to persistent IV amphetamine use although feels like he has reduced his use since April.   Plan --2w supply of Suboxone 8-2mg  tablets BID sent to the pharmacy --F/u in 2 weeks --Encourage ongoing reduction of amphetamine reduction --If unable to follow consistently in our clinic for routine OUD/suboxone monitoring, he may benefit from a more structured program in the future

## 2021-09-08 NOTE — Progress Notes (Signed)
Internal Medicine Clinic Attending  Case discussed with Dr. Christian  At the time of the visit.  We reviewed the resident's history and exam and pertinent patient test results.  I agree with the assessment, diagnosis, and plan of care documented in the resident's note.  

## 2021-09-09 ENCOUNTER — Other Ambulatory Visit (HOSPITAL_COMMUNITY): Payer: Self-pay

## 2021-09-21 ENCOUNTER — Emergency Department (HOSPITAL_COMMUNITY)
Admission: EM | Admit: 2021-09-21 | Discharge: 2021-09-21 | Disposition: A | Discharge status: Home / Self Care | Payer: Self-pay | Attending: Emergency Medicine | Admitting: Emergency Medicine

## 2021-09-21 ENCOUNTER — Encounter: Payer: Self-pay | Admitting: Student

## 2021-09-21 ENCOUNTER — Other Ambulatory Visit (HOSPITAL_COMMUNITY): Payer: Self-pay

## 2021-09-21 ENCOUNTER — Encounter (HOSPITAL_COMMUNITY): Payer: Self-pay | Admitting: Emergency Medicine

## 2021-09-21 ENCOUNTER — Other Ambulatory Visit: Payer: Self-pay

## 2021-09-21 ENCOUNTER — Encounter: Payer: Self-pay | Admitting: Internal Medicine

## 2021-09-21 DIAGNOSIS — L03115 Cellulitis of right lower limb: Secondary | ICD-10-CM | POA: Insufficient documentation

## 2021-09-21 DIAGNOSIS — B353 Tinea pedis: Secondary | ICD-10-CM | POA: Insufficient documentation

## 2021-09-21 MED ORDER — DOXYCYCLINE HYCLATE 100 MG PO CAPS
100.0000 mg | ORAL_CAPSULE | Freq: Two times a day (BID) | ORAL | 0 refills | Status: DC
  Filled 2021-09-21: qty 20, 10d supply, fill #0

## 2021-09-21 MED ORDER — CLOTRIMAZOLE 1 % EX CREA
TOPICAL_CREAM | CUTANEOUS | 2 refills | Status: DC
  Filled 2021-09-21: qty 15, 14d supply, fill #0

## 2021-09-21 MED ORDER — DOXYCYCLINE HYCLATE 100 MG PO TABS
100.0000 mg | ORAL_TABLET | Freq: Once | ORAL | Status: AC
  Administered 2021-09-21: 100 mg via ORAL
  Filled 2021-09-21: qty 1

## 2021-09-21 NOTE — ED Provider Notes (Signed)
[facility] Emergency Department Provider Note MRN:  [id number]  Arrival date & time: 09/21/21     Chief Complaint   No chief complaint on file.   History of Present Illness   Cody Rios is a 31 y.o. year-old male presents to the ED with chief complaint of insect bite and redness to his right lower leg.  Noticed the area a day or so ago.  Came to a head and he popped it.  Didn't seen anything bite him.  Denies fever.  Also asks for treatment for athletes foot.  History provided by patient.   Review of Systems  Pertinent review of systems noted in HPI.    Physical Exam   Vitals:   09/21/21 0111  BP: (!) 147/81  Pulse: 87  Resp: 18  Temp: 98.4 F (36.9 C)  SpO2: 98%    CONSTITUTIONAL:  well-appearing, NAD NEURO:  Alert and oriented x 3, CN 3-12 grossly intact EYES:  eyes equal and reactive ENT/NECK:  Supple, no stridor  CARDIO:  appears well-perfused  PULM:  No respiratory distress,  GI/GU:  non-distended,  MSK/SPINE:  No gross deformities, no edema, moves all extremities  SKIN:  2x2cm area of cellulitis to right lower medial leg, no abscess   *Additional and/or pertinent findings included in MDM below  Diagnostic and Interventional Summary    EKG Interpretation  Date/Time:    Ventricular Rate:    PR Interval:    QRS Duration:   QT Interval:    QTC Calculation:   R Axis:     Text Interpretation:         Labs Reviewed - No data to display  No orders to display    Medications  doxycycline (VIBRA-TABS) tablet 100 mg (has no administration in time range)     Procedures  /  Critical Care Procedures  ED Course and Medical Decision Making  I have reviewed the triage vital signs, the nursing notes, and pertinent available records from the EMR.  Social Determinants Affecting Complexity of Care: Patient has no clinically significant social determinants affecting this chief complaint..   ED Course:   Patient here with  insect bite.  Top differential diagnoses include cellulitis. Medical Decision Making Small area of erythema concerning for cellulitis to the RLE.  Treated with doxy in ED and for home.    Trial lotrimin for athletes foot.   Problems Addressed: Cellulitis of right lower extremity: acute illness or injury Tinea pedis of both feet: chronic illness or injury  Risk Prescription drug management.     Consultants: No consultations were needed in caring for this patient.   Treatment and Plan: Emergency department workup does not suggest an emergent condition requiring admission or immediate intervention beyond  what has been performed at this time. The patient is safe for discharge and has  been instructed to return immediately for worsening symptoms, change in  symptoms or any other concerns    Final Clinical Impressions(s) / ED Diagnoses     ICD-10-CM   1. Cellulitis of right lower extremity  L03.115       ED Discharge Orders          Ordered    doxycycline (VIBRAMYCIN) 100 MG capsule  2 times daily        09/21/21 0116    clotrimazole (LOTRIMIN) 1 % cream        09/21/21 0116              Discharge  Instructions Discussed with and Provided to Patient:   Discharge Instructions   None      Roxy Horseman, PA-C 09/21/21 0123    Long, Arlyss Repress, MD 09/21/21 [phone]

## 2021-09-21 NOTE — ED Triage Notes (Signed)
Patient reports worsening skin infection at right medial ankle with redness/swelling for several days . No drainage / denies fever .

## 2021-09-29 ENCOUNTER — Other Ambulatory Visit (HOSPITAL_COMMUNITY): Payer: Self-pay

## 2022-01-06 ENCOUNTER — Other Ambulatory Visit: Payer: Self-pay

## 2022-01-06 DIAGNOSIS — F1120 Opioid dependence, uncomplicated: Secondary | ICD-10-CM

## 2022-01-10 ENCOUNTER — Telehealth: Payer: Self-pay

## 2022-01-10 NOTE — Telephone Encounter (Signed)
Called patient x2 unable to lvm mailbox is full. Patient needs a appointment to get medication refilled.

## 2022-01-17 ENCOUNTER — Other Ambulatory Visit (HOSPITAL_COMMUNITY): Payer: Self-pay

## 2022-01-17 ENCOUNTER — Ambulatory Visit (INDEPENDENT_AMBULATORY_CARE_PROVIDER_SITE_OTHER): Payer: Self-pay | Admitting: Internal Medicine

## 2022-01-17 ENCOUNTER — Encounter: Payer: Self-pay | Admitting: Internal Medicine

## 2022-01-17 ENCOUNTER — Other Ambulatory Visit: Payer: Self-pay

## 2022-01-17 ENCOUNTER — Ambulatory Visit (HOSPITAL_COMMUNITY)
Admission: RE | Admit: 2022-01-17 | Discharge: 2022-01-17 | Disposition: A | Discharge status: Home / Self Care | Payer: Self-pay | Source: Ambulatory Visit | Attending: Internal Medicine | Admitting: Internal Medicine

## 2022-01-17 VITALS — BP 128/82 | HR 76 | Ht 74.0 in | Wt 169.0 lb

## 2022-01-17 DIAGNOSIS — F1120 Opioid dependence, uncomplicated: Secondary | ICD-10-CM

## 2022-01-17 DIAGNOSIS — F1121 Opioid dependence, in remission: Secondary | ICD-10-CM

## 2022-01-17 DIAGNOSIS — I330 Acute and subacute infective endocarditis: Secondary | ICD-10-CM | POA: Insufficient documentation

## 2022-01-17 DIAGNOSIS — R768 Other specified abnormal immunological findings in serum: Secondary | ICD-10-CM

## 2022-01-17 MED ORDER — BUPRENORPHINE HCL-NALOXONE HCL 8-2 MG SL SUBL
0.5000 | SUBLINGUAL_TABLET | Freq: Two times a day (BID) | SUBLINGUAL | 0 refills | Status: AC
  Filled 2022-01-17: qty 7, 7d supply, fill #0

## 2022-01-17 NOTE — Assessment & Plan Note (Addendum)
Patient presents for an OUD follow up today.  He was previously prescribed Suboxone 8-2 mg tablets twice daily, however he has been taking 1 tablet every 3 to 4 days to ration out his pills.  He believes that his cravings were well controlled taking Suboxone as noted above. He ran out of his Suboxone purchasing it off of the streets, however his supply ran out approximately 6 days ago.  He endorses relapsing heroin over this time, most recently snorting heroin 4 hours prior to his appointment. Over this time period, he continued to use IV amphetamines, however, he states that he has not used this in approximately 3 weeks.   Plan: - ToxAssure today - Suboxone 8-2 mg tablets (advised to take half a tablet bid) - start once COWS score ~12 (counseled on s/s of withdrawal) - Follow up in one week

## 2022-01-17 NOTE — Assessment & Plan Note (Addendum)
Patient has a history of IV drug use, bacteremia, endocarditis in 2018.  He last had an echocardiogram in February of this year which showed severe tricuspid regurgitation with any evidence of vegetations.  Today, the patient endorses 2-week history of a dull aching chest pain around the left side of his chest.  He denies any fevers, chills, SOB, abd pain, n/v/d, or back pain. His vitals are also stable today - making endocarditis less likely. EKG performed in the clinic and no abnormalities were seen - read as normal sinus rhythm.    Plan: - TTE ordered

## 2022-01-17 NOTE — Progress Notes (Signed)
   CC: OUD follow up  HPI:  Mr.Arshawn A Chow is a 31 y.o. male with hypertension and OUD who presents to the [facility] for an OUD follow up. Please see problem-based list for further details, assessments, and plans.  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    Respiratory:  Negative for cough and shortness of breath.   Cardiovascular:  Positive for chest pain and palpitations.  Gastrointestinal:  Negative for constipation, diarrhea, nausea and vomiting.  Genitourinary: Negative.     Physical Exam:  Vitals:   01/17/22 1340 01/17/22 1344  BP:  128/82  Pulse:  76  Weight: 169 lb (76.7 kg)   Height: 6\' 2"  (1.88 m)   PF:  98 L/min   General: Young, ill-appearing male. No acute distress. CV: RRR. Systolic murmur. No LE edema Pulmonary: Lungs CTAB. Normal effort. No wheezing or rales. Abdominal: Soft, nontender, nondistended. Normal bowel sounds. Extremities: Normal ROM. Skin: Warm and dry.  Neuro: A&Ox3. No focal deficit. Psych: Normal mood and affect   Assessment & Plan:   Severe opioid use disorder, in early remission [facility]) Patient presents for an OUD follow up today.  He was previously prescribed Suboxone 8-2 mg tablets twice daily, however he has been taking 1 tablet every 3 to 4 days to ration out his pills.  He believes that his cravings were well controlled taking Suboxone as noted above. He ran out of his Suboxone purchasing it off of the streets, however his supply ran out approximately 6 days ago.  He endorses relapsing heroin over this time, most recently snorting heroin 4 hours prior to his appointment. Over this time period, he continued to use IV amphetamines, however, he states  that he has not used this in approximately 3 weeks.   Plan: - ToxAssure today - Suboxone 8-2 mg tablets (advised to take half a tablet bid) - start once COWS score ~12 (counseled on s/s of withdrawal) - Follow up in one week   HCV antibody positive Patient has a hx of HCV genotype 1a, treated in 2019. He has continued to use IV drugs over this time period and we will check his viral load today. Patient would also like to be screened for HIV.   Plan: - CMP, CBC - Acute hepatitis panel, HCV viral load - HIV   Endocarditis Patient has a history of IV drug use, bacteremia, endocarditis in 2018.  He last had an echocardiogram in February of this year which showed severe tricuspid regurgitation with any evidence of vegetations.  Today, the patient endorses 2-week history of a dull aching chest pain around the left side of his chest.  He denies any fevers, chills, SOB, abd pain, n/v/d, or back pain. His vitals are also stable today - making endocarditis less likely. EKG performed in the clinic and no abnormalities were seen - read as normal sinus rhythm.    Plan: - TTE ordered    Patient seen with Dr. Daryll Drown

## 2022-01-17 NOTE — Patient Instructions (Signed)
Thank you, Mr.Elvyn A Kray for allowing [place] to provide your care today. Today we discussed:  Opioid use disorder: I am glad you came back to get back on suboxone! Please take 1/2 a tablet of suboxone twice a day. I have sent in enough pills for 7 days and we will see you back for an appt in 1 week.  We are also getting some lab work to check your liver function, the status of your hepatitis C, and to check for HIV We are getting an ultrasound of your heart given you have had endocarditis in the past    I have ordered the following labs for you:   Lab Orders         ToxAssure Select,+Antidepr,UR         Acute Viral Hepatitis (HAV, HBV, HCV)         CMP14 + Anion Gap         CBC no Diff         Hepatitis C Ab reflex to Quant PCR         HIV antibody (with reflex)      Tests ordered today:  Echocardiogram (heart ultrasound)  Referrals ordered today:   Referral Orders  No referral(s) requested today     I have ordered the following medication/changed the following medications:   Stop the following medications: Medications Discontinued During This Encounter  Medication Reason   buprenorphine-naloxone (SUBOXONE) 8-2 mg SUBL SL tablet Reorder     Start the following medications: Meds ordered this encounter  Medications   buprenorphine-naloxone (SUBOXONE) 8-2 mg SUBL SL tablet    Sig: Place 0.5 tablets under the tongue in the morning and at bedtime for 7 days.    Dispense:  7 tablet    Refill:  0    IM program     Follow up:  1 week    Should you have any questions or concerns please call the internal medicine clinic at [phone].     Buddy Duty, D.O. [place]

## 2022-01-17 NOTE — Assessment & Plan Note (Signed)
Patient has a hx of HCV genotype 1a, treated in 2019. He has continued to use IV drugs over this time period and we will check his viral load today. Patient would also like to be screened for HIV.   Plan: - CMP, CBC - Acute hepatitis panel, HCV viral load - HIV

## 2022-01-18 LAB — CMP14 + ANION GAP
ALT: 31 IU/L (ref 0–44)
AST: 41 IU/L — ABNORMAL HIGH (ref 0–40)
Albumin/Globulin Ratio: 2.1 (ref 1.2–2.2)
Albumin: 4.7 g/dL (ref 4.1–5.1)
Alkaline Phosphatase: 69 IU/L (ref 44–121)
Anion Gap: 18 mmol/L (ref 10.0–18.0)
BUN/Creatinine Ratio: 15 (ref 9–20)
BUN: 16 mg/dL (ref 6–20)
Bilirubin Total: 0.5 mg/dL (ref 0.0–1.2)
CO2: 22 mmol/L (ref 20–29)
Calcium: 9.6 mg/dL (ref 8.7–10.2)
Chloride: 100 mmol/L (ref 96–106)
Creatinine, Ser: 1.06 mg/dL (ref 0.76–1.27)
Globulin, Total: 2.2 g/dL (ref 1.5–4.5)
Glucose: 106 mg/dL — ABNORMAL HIGH (ref 70–99)
Potassium: 3.7 mmol/L (ref 3.5–5.2)
Sodium: 140 mmol/L (ref 134–144)
Total Protein: 6.9 g/dL (ref 6.0–8.5)
eGFR: 96 mL/min/{1.73_m2} (ref >59)

## 2022-01-18 LAB — ACUTE VIRAL HEPATITIS (HAV, HBV, HCV)
HCV Ab: REACTIVE — AB
Hep A IgM: NEGATIVE
Hep B C IgM: NEGATIVE
Hepatitis B Surface Ag: NEGATIVE

## 2022-01-18 LAB — CBC
Hematocrit: 42.6 % (ref 37.5–51.0)
Hemoglobin: 14.3 g/dL (ref 13.0–17.7)
MCH: 29.2 pg (ref 26.6–33.0)
MCHC: 33.6 g/dL (ref 31.5–35.7)
MCV: 87 fL (ref 79–97)
Platelets: 345 10*3/uL (ref 150–450)
RBC: 4.89 x10E6/uL (ref 4.14–5.80)
RDW: 13.8 % (ref 11.6–15.4)
WBC: 6.1 10*3/uL (ref 3.4–10.8)

## 2022-01-18 LAB — HCV RT-PCR, QUANT (NON-GRAPH): Hepatitis C Quantitation: NOT DETECTED IU/mL

## 2022-01-18 LAB — HIV ANTIBODY (ROUTINE TESTING W REFLEX): HIV Screen 4th Generation wRfx: NONREACTIVE

## 2022-01-18 NOTE — Progress Notes (Signed)
Internal Medicine Clinic Attending  Case discussed with Dr. Raymondo Band  at the time of the visit.  We reviewed the resident's history and exam and pertinent patient test results.  I agree with the assessment, diagnosis, and plan of care documented in the resident's note.   Patient is uninsured.  Not sure if he has CAFA or [employer].  Work up may be delayed.  Most important will be to get him controlled again on suboxone therapy and get him further work up.  Return precautions provided.

## 2022-01-19 ENCOUNTER — Other Ambulatory Visit (HOSPITAL_COMMUNITY): Payer: Self-pay

## 2022-01-20 LAB — TOXASSURE SELECT,+ANTIDEPR,UR

## 2022-01-20 NOTE — Progress Notes (Signed)
Labwork generally unremarkable. HCV Ab positive, although hepatitis C quant is read as "not detected" consistent with previous treatment. HIV negative.  ToxAssure with unexpected result for methamphetamines and fentanyl, although patient did admit to me that he relapsed prior to his appointment. Cocaine was an unexpected result, as well. Buprenorphine not detected, as the patient revealed to me that he had not taken suboxone in ~6 days.

## 2022-01-26 ENCOUNTER — Encounter: Payer: Self-pay | Admitting: Student

## 2022-01-28 ENCOUNTER — Emergency Department (HOSPITAL_COMMUNITY)
Admission: EM | Admit: 2022-01-28 | Discharge: 2022-01-28 | Disposition: A | Discharge status: Home / Self Care | Payer: Self-pay | Attending: Emergency Medicine | Admitting: Emergency Medicine

## 2022-01-28 ENCOUNTER — Encounter (HOSPITAL_COMMUNITY): Payer: Self-pay

## 2022-01-28 DIAGNOSIS — R21 Rash and other nonspecific skin eruption: Secondary | ICD-10-CM | POA: Insufficient documentation

## 2022-01-28 DIAGNOSIS — L237 Allergic contact dermatitis due to plants, except food: Secondary | ICD-10-CM

## 2022-01-28 MED ORDER — PREDNISONE 10 MG PO TABS
ORAL_TABLET | ORAL | 0 refills | Status: AC
  Filled 2022-01-28: qty 42, 12d supply, fill #0

## 2022-01-28 MED ORDER — PREDNISONE 20 MG PO TABS
60.0000 mg | ORAL_TABLET | Freq: Once | ORAL | Status: AC
  Administered 2022-01-28: 60 mg via ORAL
  Filled 2022-01-28: qty 3

## 2022-01-28 MED ORDER — HYDROXYZINE HCL 25 MG PO TABS
25.0000 mg | ORAL_TABLET | Freq: Four times a day (QID) | ORAL | 0 refills | Status: DC
  Filled 2022-01-28: qty 12, 3d supply, fill #0

## 2022-01-28 NOTE — ED Triage Notes (Signed)
Pt reports that he got into poison oak the other day while picking up branches, pt has been putting bleach on the rash, no it is swelling and painful

## 2022-01-28 NOTE — ED Provider Notes (Signed)
[place] DEPT Provider Note   CSN: [id number] Arrival date & time: 01/28/22  2046     History  Chief Complaint  Patient presents with   Rash    ZACKARIAH VANDERPOL is a 31 y.o. male.  31 year old male presents with 2 days of rash to his extremity.  Is been pruritic in nature.  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  Did apply bleach to his skin without relief.       Home Medications Prior to Admission medications   Medication Sig Start Date End Date Taking? Authorizing Provider  clotrimazole (LOTRIMIN) 1 % cream Apply to feet morning and evening x 2 weeks 09/21/21   Montine Circle, PA-C  ibuprofen (ADVIL) 200 MG tablet Take 400-600 mg by mouth every 6 (six) hours as needed for headache or mild pain.    [provider]      Allergies    Cephalosporins, Cipro [ciprofloxacin hcl], and Penicillins    Review of Systems   Review of Systems  All other systems reviewed and are negative.   Physical Exam Updated Vital Signs BP 130/82   Pulse 86   Temp 97.7 F (36.5 C) (Oral)   Resp 20   SpO2 99%  Physical Exam Vitals and nursing note reviewed.  Constitutional:      General: He is not in acute distress.    Appearance: Normal appearance. He is well-developed. He is not toxic-appearing.  HENT:     Head: Normocephalic and atraumatic.  Eyes:     General: Lids are normal.     Conjunctiva/sclera: Conjunctivae normal.     Pupils: Pupils are equal, round, and reactive to light.  Neck:     Thyroid: No thyroid mass.     Trachea: No tracheal deviation.  Cardiovascular:     Rate and Rhythm: Normal rate and regular rhythm.     Heart sounds: Normal heart sounds. No murmur heard.    No gallop.  Pulmonary:     Effort: Pulmonary effort is normal. No respiratory distress.     Breath sounds: Normal breath sounds. No stridor. No decreased breath sounds, wheezing, rhonchi or rales.  Abdominal:     General: There  is no distension.     Palpations: Abdomen is soft.     Tenderness: There is no abdominal tenderness. There is no rebound.  Musculoskeletal:        General: No tenderness. Normal range of motion.     Cervical back: Normal range of motion and neck supple.  Skin:    General: Skin is warm and dry.     Findings: Rash present. No abrasion. Rash is macular and papular. Rash is not urticarial.  Neurological:     Mental Status: He is alert and oriented to person, place, and time. Mental status is at baseline.     GCS: GCS eye subscore is 4. GCS verbal subscore is 5. GCS motor subscore is 6.     Cranial Nerves: No cranial nerve deficit.     Sensory: No sensory deficit.     Motor: Motor function is intact.  Psychiatric:        Attention and Perception: Attention normal.        Speech: Speech normal.        Behavior: Behavior normal.     ED Results / Procedures / Treatments   Labs (all labs ordered are listed, but only abnormal results are displayed) Labs Reviewed - No  data to display  EKG None  Radiology No results found.  Procedures Procedures    Medications Ordered in ED Medications  predniSONE (DELTASONE) tablet 60 mg (60 mg Oral Given 01/28/22 2257)    ED Course/ Medical Decision Making/ A&P                           Medical Decision Making Risk Prescription drug management.   Given 60 mg of prednisone here for suspected poison sumac.  Will place on short course of prednisone        Final Clinical Impression(s) / ED Diagnoses Final diagnoses:  None    Rx / DC Orders ED Discharge Orders     None         Lorre Nick, MD 01/28/22 2303

## 2022-01-28 NOTE — ED Provider Triage Note (Signed)
Emergency Medicine Provider Triage Evaluation Note  Cody Rios , a 31 y.o. male  was evaluated in triage.  Pt complains of poison oak rash.  States he had this 3 days ago while he was [place].  Reported instant itching.  Did take a bleach bath this morning and states that it made the itching and burning sensation worse.  He also states made the rash spread.  Review of Systems  Positive: As above Negative: Is a  Physical Exam  BP 130/82   Pulse 86   Temp 97.7 F (36.5 C) (Oral)   Resp 20   SpO2 99%  Gen:   Awake, no distress   Resp:  Normal effort  MSK:   Moves extremities without difficulty  Other:  Diffuse erythematous rash without blisters  Medical Decision Making  Medically screening exam initiated at 9:22 PM.  Appropriate orders placed.  Cody Rios was informed that the remainder of the evaluation will be completed by another provider, this initial triage assessment does not replace that evaluation, and the importance of remaining in the ED until their evaluation is complete.     Cody Rios, [place] 01/28/22 2123

## 2022-01-29 ENCOUNTER — Other Ambulatory Visit (HOSPITAL_COMMUNITY): Payer: Self-pay

## 2022-01-31 ENCOUNTER — Other Ambulatory Visit (HOSPITAL_COMMUNITY): Payer: Self-pay

## 2022-02-01 ENCOUNTER — Ambulatory Visit (INDEPENDENT_AMBULATORY_CARE_PROVIDER_SITE_OTHER): Payer: Self-pay | Admitting: Student

## 2022-02-01 DIAGNOSIS — F1121 Opioid dependence, in remission: Secondary | ICD-10-CM

## 2022-02-01 NOTE — Progress Notes (Unsigned)
  [facility] Internal Medicine Residency Telephone Encounter Continuity Care Appointment  HPI:  This telephone encounter was created for [name] on 02/03/2022 for the following purpose/cc opioid use disorder.   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  As always, pt is advised that if symptoms worsen or new symptoms arise, they should go to an urgent care facility or to to ER for further evaluation.   Consent and Medical Decision Making:  Patient discussed with Dr. Philipp Ovens This is a telephone encounter between [place] on 02/03/2022 for opioid use disorder. The visit was conducted with the patient located at home and Sanjuan Dame at [facility]. The patient's identity was confirmed using their DOB and current address. The patient has consented to being evaluated through a telephone encounter and understands the associated risks (an examination cannot be done and the patient may need to come in for an appointment) / benefits (allows the patient to remain at home, decreasing exposure to coronavirus). I personally spent 5 minutes on medical discussion.    Called patient 2:42PM, no answer, unable to leave VM. Called patient 2:48PM, no answer, unable to leave VM.  Severe opioid use disorder, in early remission [facility]) Mr. Pargas is presenting via telephone today to discuss opioid use disorder. Reports that he has not started the suboxone that he was given last time, but has them in his possession. States that he has still been  using illicit substances, including heroin and amphetamines, but would like to quit in the near future.   Counseled Mr. Bergdoll on how to initiate suboxone. Also discussed suboxone to help decrease his usage for a risk management strategy. Patient reports he would ideally like to start this next week. I encouraged him to let us know when he starts so that we can help monitor his symptoms.  - Start suboxone therapy - Return to clinic after initiation  Sanjuan Dame, MD Internal Medicine PGY-3 Pager: [phone]

## 2022-02-02 ENCOUNTER — Encounter (HOSPITAL_COMMUNITY): Payer: Self-pay

## 2022-02-02 ENCOUNTER — Emergency Department (HOSPITAL_COMMUNITY)
Admission: EM | Admit: 2022-02-02 | Discharge: 2022-02-02 | Disposition: A | Discharge status: Home / Self Care | Payer: Self-pay | Attending: Emergency Medicine | Admitting: Emergency Medicine

## 2022-02-02 ENCOUNTER — Other Ambulatory Visit: Payer: Self-pay

## 2022-02-02 ENCOUNTER — Other Ambulatory Visit (HOSPITAL_COMMUNITY): Payer: Self-pay

## 2022-02-02 DIAGNOSIS — L237 Allergic contact dermatitis due to plants, except food: Secondary | ICD-10-CM | POA: Insufficient documentation

## 2022-02-02 DIAGNOSIS — R011 Cardiac murmur, unspecified: Secondary | ICD-10-CM | POA: Insufficient documentation

## 2022-02-02 MED ORDER — DOXYCYCLINE HYCLATE 100 MG PO CAPS
100.0000 mg | ORAL_CAPSULE | Freq: Two times a day (BID) | ORAL | 0 refills | Status: DC
Start: 2022-02-02 — End: 2022-10-19
  Filled 2022-02-02: qty 20, 10d supply, fill #0

## 2022-02-02 MED ORDER — PREDNISONE 20 MG PO TABS
60.0000 mg | ORAL_TABLET | Freq: Once | ORAL | Status: AC
  Administered 2022-02-02: 60 mg via ORAL
  Filled 2022-02-02: qty 3

## 2022-02-02 MED ORDER — PREDNISONE 10 MG PO TABS
50.0000 mg | ORAL_TABLET | Freq: Every day | ORAL | 0 refills | Status: AC
  Filled 2022-02-02: qty 25, 5d supply, fill #0

## 2022-02-02 MED ORDER — DOXYCYCLINE HYCLATE 100 MG PO TABS
100.0000 mg | ORAL_TABLET | Freq: Once | ORAL | Status: AC
Start: 2022-02-02 — End: 2022-02-02
  Administered 2022-02-02: 100 mg via ORAL
  Filled 2022-02-02: qty 1

## 2022-02-02 NOTE — ED Provider Notes (Signed)
[facility]-EMERGENCY DEPT Provider Note   CSN: [id number] Arrival date & time: 02/02/22  0554     History  Chief Complaint  Patient presents with   Poison Ivy    Cody Rios is a 31 y.o. male past medical history of tricuspid valve endocarditis, hepatitis, IVDU presented to the emergency department for evaluation of rash.  He started to have poison ivy rash on his arms 4 days ago.  The rash then spread all over his body with intense itching sensation.  Also noticed right leg has gotten swollen 2 days ago.  Has taken Benadryl with no improvement.   Poison Ivy       Home Medications Prior to Admission medications   Medication Sig Start Date End Date Taking? Authorizing Provider  clotrimazole (LOTRIMIN) 1 % cream Apply to feet morning and evening x 2 weeks 09/21/21   Roxy Horseman, PA-C  hydrOXYzine (ATARAX) 25 MG tablet Take 1 tablet (25 mg total) by mouth every 6 (six) hours. 01/28/22   Lorre Nick, MD  ibuprofen (ADVIL) 200 MG tablet Take 400-600 mg by mouth every 6 (six) hours as needed for headache or mild pain.    [provider]  predniSONE (DELTASONE) 10 MG tablet Take 6 tablets by mouth daily for 2 days, THEN 5 tablets daily for 2 days, THEN 4 tablets  daily for 2 days, THEN 3 tablets daily for 2 days, THEN 2 tablets daily for 2 days, THEN 1 tablet  daily for 2 days. 01/28/22 02/12/22  Lorre Nick, MD      Allergies    Cephalosporins, Cipro [ciprofloxacin hcl], and Penicillins    Review of Systems   Review of Systems  Skin:  Positive for rash.    Physical Exam Updated Vital Signs BP (!) 147/85   Pulse 86   Temp 98.8 F (37.1 C)   Resp 18   SpO2 95%  Physical Exam Vitals and nursing note reviewed.  Constitutional:      Appearance: Normal appearance.  HENT:     Head: Normocephalic and atraumatic.     Mouth/Throat:     Mouth: Mucous membranes are moist.  Eyes:     General: No scleral icterus. Cardiovascular:      Rate and Rhythm: Normal rate and regular rhythm.     Pulses: Normal pulses.     Heart sounds: Murmur heard.  Pulmonary:     Effort: Pulmonary effort is normal.     Breath sounds: Normal breath sounds.  Abdominal:     General: Abdomen is flat.     Palpations: Abdomen is soft.     Tenderness: There is no abdominal tenderness.  Musculoskeletal:        General: No deformity.  Skin:    General: Skin is warm.     Findings: No rash.     Comments: Maculopapular rash on arms, legs, trunk.  Neurological:     General: No focal deficit present.     Mental Status: He is alert.  Psychiatric:        Mood and Affect: Mood normal.     ED Results / Procedures / Treatments   Labs (all labs ordered are listed, but only abnormal results are displayed) Labs Reviewed - No data to display  EKG None  Radiology No results found.  Procedures Procedures    Medications Ordered in ED Medications - No data to display  ED Course/ Medical Decision Making/ A&P  Medical Decision Making Amount and/or Complexity of Data Reviewed Labs: ordered.  Risk Prescription drug management.   This patient presents to the ED for concern of poison ivy, this involves an extensive number of treatment options, and is a complaint that carries with a high risk of complications and morbidity.  The differential diagnosis includes dermatitis, [place] spotted fever, viral exanthem. This is not an exhaustive list.  Comorbidities that complicate the patient evaluation See HPI  Social determinants of health NA  Additional history obtained: Additional history obtained from EMR. External records from outside source obtained and review including prior labs  Cardiac monitoring/EKG: The patient was maintained on a cardiac monitor.  I personally reviewed and interpreted the cardiac monitor which showed an underlying rhythm of: Sinus rhythm.  Lab tests: None  Imaging  studies: NA  Problem list/ ED course/ Critical interventions/ Medical management: HPI: See above Vital signs significant for blood pressure 147/85 within normal range and stable throughout visit. Laboratory/imaging studies significant for: See above. On physical examination, patient is afebrile and appears in no acute distress.  There are multiple maculopapular rash on arms legs and trunk.  Rash on his arms does look like poison ivy type of rash.  Cannot completely rule out [facility] spotted fever.  I ordered RMSF test and cover him up with doxycycline 100 mg twice a day.  I sent a short course prescription of prednisone for 5 days to treat poison ivy.  Patient's clinical presentations and laboratory/imaging studies are most concerned for poison ivy versus [facility] spotted fever..   Doxycycline and prednisone ordered. Reevaluation of the patient after these medications showed that the patient stayed the same.   I have reviewed the patient home medicines and have made adjustments as needed.  Consultations obtained: NA  Disposition Continued outpatient therapy. Follow-up with PCP recommended for reevaluation of symptoms. Treatment plan discussed with patient.  Pt acknowledged understanding was agreeable to the plan. Worrisome signs and symptoms were discussed with patient, and patient acknowledged understanding to return to the ED if they noticed these signs and symptoms. Patient was stable upon discharge.   This chart was dictated using voice recognition software.  Despite best efforts to proofread,  errors can occur which can change the documentation meaning.          Final Clinical Impression(s) / ED Diagnoses Final diagnoses:  Poison ivy dermatitis    Rx / DC Orders ED Discharge Orders          Ordered    doxycycline (VIBRAMYCIN) 100 MG capsule  2 times daily        02/02/22 1336    predniSONE (DELTASONE) 10 MG tablet  Daily        02/02/22 1336              Rex Kras, [place] 02/02/22 1944    Tegeler, Gwenyth Allegra, MD 02/03/22 [phone]

## 2022-02-02 NOTE — Discharge Instructions (Addendum)
Please take Benadryl and prednisone as prescribed. Take tylenol/ibuprofen for pain. I recommend close follow-up with PCP for reevaluation.  Please do not hesitate to return to emergency department if worrisome signs symptoms we discussed become apparent.

## 2022-02-02 NOTE — ED Triage Notes (Signed)
Pt states that his poison oak is getting worse and spreading all over.

## 2022-02-03 NOTE — Assessment & Plan Note (Signed)
Cody Rios is presenting via telephone today to discuss opioid use disorder. Reports that he has not started the suboxone that he was given last time, but has them in his possession. States that he has still been using illicit substances, including heroin and amphetamines, but would like to quit in the near future.   Counseled [name] on how to initiate suboxone. Also discussed suboxone to help decrease his usage for a risk management strategy. Patient reports he would ideally like to start this next week. I encouraged him to let us know when he starts so that we can help monitor his symptoms.  - Start suboxone therapy - Return to clinic after initiation

## 2022-02-04 LAB — ROCKY MTN SPOTTED FVR ABS PNL(IGG+IGM)
RMSF IgG: POSITIVE — AB
RMSF IgM: 0.41 index (ref 0.00–0.89)

## 2022-02-04 LAB — RMSF, IGG, IFA: RMSF, IGG, IFA: 1:256 {titer} — ABNORMAL HIGH

## 2022-02-08 NOTE — Progress Notes (Signed)
Internal Medicine Clinic Attending ? ?Case discussed with Dr. Braswell  At the time of the visit.  We reviewed the resident?s history and exam and pertinent patient test results.  I agree with the assessment, diagnosis, and plan of care documented in the resident?s note.  ?

## 2022-02-08 NOTE — Addendum Note (Signed)
Addended by: Jodean Lima on: 02/08/2022 08:47 AM   Modules accepted: Level of Service

## 2022-02-10 ENCOUNTER — Other Ambulatory Visit (HOSPITAL_COMMUNITY): Payer: Self-pay

## 2022-02-18 ENCOUNTER — Other Ambulatory Visit: Payer: Self-pay

## 2022-02-18 ENCOUNTER — Emergency Department (HOSPITAL_COMMUNITY)
Admission: EM | Admit: 2022-02-18 | Discharge: 2022-02-19 | Disposition: A | Discharge status: Home / Self Care | Payer: Self-pay | Attending: Emergency Medicine | Admitting: Emergency Medicine

## 2022-02-18 ENCOUNTER — Encounter (HOSPITAL_COMMUNITY): Payer: Self-pay

## 2022-02-18 DIAGNOSIS — M79605 Pain in left leg: Secondary | ICD-10-CM | POA: Insufficient documentation

## 2022-02-18 NOTE — ED Triage Notes (Signed)
States that over the back couple of the days he has been having increased numbness in the left leg that has moved to his back and across his chest. States he has a infection in his foot as well

## 2022-02-19 MED ORDER — DOXYCYCLINE HYCLATE 100 MG PO CAPS
100.0000 mg | ORAL_CAPSULE | Freq: Two times a day (BID) | ORAL | 0 refills | Status: AC
Start: 2022-02-19 — End: 2022-03-01
  Filled 2022-02-19: qty 14, 7d supply, fill #0

## 2022-02-19 MED ORDER — PREDNISONE 10 MG PO TABS
30.0000 mg | ORAL_TABLET | Freq: Every day | ORAL | 0 refills | Status: AC
  Filled 2022-02-19: qty 15, 5d supply, fill #0

## 2022-02-19 NOTE — Discharge Instructions (Signed)
You have been seen here for left leg pain, I recommend taking over-the-counter pain medications like ibuprofen and/or Tylenol every 6 as needed.  Please follow dosage and on the back of bottle.  I also recommend applying heat to the area and stretching out the muscles as this will help decrease stiffness and pain.  Given you steroids take as prescribed  You were diagnosed with [facility] spotted fever 2 weeks ago, I like you to restart your doxycycline, and please follow-up with infectious disease.  Come back to the emergency department if you develop chest pain, shortness of breath, severe abdominal pain, uncontrolled nausea, vomiting, diarrhea.

## 2022-02-19 NOTE — ED Provider Notes (Signed)
[facility]-EMERGENCY DEPT Provider Note   CSN: [id number] Arrival date & time: 02/18/22  2306     History  Chief Complaint  Patient presents with   Leg Pain    Cody Rios is a 31 y.o. male.  HPI   Medical history including polysubstance use, endocarditis, bacteremia, hep C presents with complaints of leg pain.  Patient states that started about 3 days ago, states he feels some paresthesias on the left upper thigh, does not radiate, denies any saddle paresthesias urinary or bowel incontinency's, no urinary symptoms no recent trauma, does not endorse any back pain, states that he has been mountain biking a lot and feels it might be from that, is not having fevers chills chest pain shortness of breath.  He also notes that he was diagnosed with [facility] spotted fever and was given antibiotics but did not finish it, states he took 3 days.   I reviewed patient's chart was seen on the first of this month concern for possible [place], lab work was confirmed.   Home Medications Prior to Admission medications   Medication Sig Start Date End Date Taking? Authorizing Provider  doxycycline (VIBRAMYCIN) 100 MG capsule Take 1 capsule (100 mg total) by mouth 2 (two) times daily for 7 days. 02/19/22 02/26/22 Yes Carroll Sage, PA-C  predniSONE (DELTASONE) 10 MG tablet Take 3 tablets (30 mg total) by mouth daily for 5 days. 02/19/22 02/24/22 Yes Carroll Sage, PA-C  clotrimazole (LOTRIMIN) 1 % cream Apply to feet morning and evening x 2 weeks 09/21/21   Roxy Horseman, PA-C  doxycycline (VIBRAMYCIN) 100 MG capsule Take 1 capsule (100 mg total) by mouth 2 (two) times daily. 02/02/22   Jeanelle Malling, PA  hydrOXYzine (ATARAX) 25 MG tablet Take 1 tablet (25 mg total) by mouth every 6 (six) hours. 01/28/22   Lorre Nick, MD  ibuprofen (ADVIL) 200 MG tablet Take 400-600 mg by mouth every 6 (six) hours as needed for headache or mild pain.    [provider]      Allergies    Cephalosporins, Cipro [ciprofloxacin hcl], and Penicillins    Review of Systems   Review of Systems  Constitutional:  Negative for chills and fever.  Respiratory:  Negative for shortness of breath.   Cardiovascular:  Negative for chest pain.  Gastrointestinal:  Negative for abdominal pain.  Musculoskeletal:        Leg pain  Neurological:  Negative for headaches.    Physical Exam Updated Vital Signs BP (!) 162/109 (BP Location: Right Arm)   Pulse 96   Temp 97.6 F (36.4 C) (Oral)   Resp 18   Ht 6\' 2"  (1.88 m)   Wt 74.8 kg   SpO2 96%   BMI 21.18 kg/m  Physical Exam Vitals and nursing note reviewed.  Constitutional:      General: He is not in acute distress.    Appearance: He is not ill-appearing.  HENT:     Head: Normocephalic and atraumatic.     Nose: No congestion.  Eyes:     Conjunctiva/sclera: Conjunctivae normal.  Cardiovascular:     Rate and Rhythm: Normal rate and regular rhythm.     Pulses: Normal pulses.     Heart sounds: Normal heart sounds. No murmur heard.    No friction rub. No gallop.  Pulmonary:     Effort: No respiratory distress.     Breath sounds: No wheezing, rhonchi or rales.  Musculoskeletal:  Right lower leg: No edema.     Left lower leg: No edema.     Comments: Spine was palpated was nontender to palpation no step-off or deformities noted, no overlying skin changes, patient has point tenderness on his left buttocks, he has full range of motion in his lower extremities, 2+ dorsal pedal pulses, 5-5 strength.  Compartments soft, no leg swelling no palpable cords  Skin:    General: Skin is warm and dry.  Neurological:     Mental Status: He is alert.  Psychiatric:        Mood and Affect: Mood normal.     ED Results / Procedures / Treatments   Labs (all labs ordered are listed, but only abnormal results are displayed) Labs Reviewed - No data to display  EKG None  Radiology No results  found.  Procedures Procedures    Medications Ordered in ED Medications - No data to display  ED Course/ Medical Decision Making/ A&P                           Medical Decision Making  This patient presents to the ED for concern of leg pain, this involves an extensive number of treatment options, and is a complaint that carries with it a high risk of complications and morbidity.  The differential diagnosis includes spina equina, fracture, DVT    Additional history obtained:  Additional history obtained from N/A External records from outside source obtained and reviewed including PCP notes   Co morbidities that complicate the patient evaluation  Polysubstance dependency  Social Determinants of Health:  N/A    Lab Tests:  I Ordered, and personally interpreted labs.  The pertinent results include: N/A   Imaging Studies ordered:  I ordered imaging studies including N/A I independently visualized and interpreted imaging which showed N/A I agree with the radiologist interpretation   Cardiac Monitoring:  The patient was maintained on a cardiac monitor.  I personally viewed and interpreted the cardiac monitored which showed an underlying rhythm of: N/A   Medicines ordered and prescription drug management:  I ordered medication including N/A I have reviewed the patients home medicines and have made adjustments as needed  Critical Interventions:  N/A   Reevaluation:  Patient has a benign physical exam, agreement plan discharge.    Consultations Obtained:  N/A    Test Considered:  N/A    Rule out I have low suspicion for spinal fracture or spinal cord abnormality as patient denies urinary incontinency, retention, difficulty with bowel movements, denies saddle paresthesias.  Spine was palpated there is no step-off, crepitus or gross deformities felt, patient had 5/5 strength, full range of motion, neurovascular fully intact in the lower extremities  suspicion for DVT no unilateral leg swelling, there is no palpable cords. Low suspicion for septic arthritis as patient denies IV drug use, skin exam was performed no erythematous, edema or warm joints noted.     Dispostion and problem list  After consideration of the diagnostic results and the patients response to treatment, I feel that the patent would benefit from discharge.  Left leg paresthesias-suspect piriformis syndrome, will provide with short course of steroids, follow-up with PCP for further evaluation Recommend spotted fever-since patient may positive lab results, and he did not finish his antibiotics will have a repeat doxycycline, and have him follow-up with ID.            Final Clinical Impression(s) / ED Diagnoses Final  diagnoses:  Left leg pain    Rx / DC Orders ED Discharge Orders          Ordered    predniSONE (DELTASONE) 10 MG tablet  Daily        02/19/22 0122    doxycycline (VIBRAMYCIN) 100 MG capsule  2 times daily        02/19/22 0122              Carroll Sage, PA-C 02/19/22 0124    Maia Plan, MD 03/02/22 2039

## 2022-02-21 ENCOUNTER — Other Ambulatory Visit (HOSPITAL_COMMUNITY): Payer: Self-pay

## 2022-02-22 ENCOUNTER — Other Ambulatory Visit (HOSPITAL_COMMUNITY): Payer: Self-pay

## 2022-03-02 ENCOUNTER — Other Ambulatory Visit (HOSPITAL_COMMUNITY): Payer: Self-pay

## 2022-03-23 ENCOUNTER — Encounter (HOSPITAL_COMMUNITY): Payer: Self-pay

## 2022-03-23 ENCOUNTER — Other Ambulatory Visit (HOSPITAL_COMMUNITY): Payer: Self-pay

## 2022-03-23 ENCOUNTER — Emergency Department (HOSPITAL_COMMUNITY)
Admission: EM | Admit: 2022-03-23 | Discharge: 2022-03-23 | Disposition: A | Discharge status: Home / Self Care | Payer: Self-pay | Attending: Emergency Medicine | Admitting: Emergency Medicine

## 2022-03-23 ENCOUNTER — Other Ambulatory Visit: Payer: Self-pay

## 2022-03-23 DIAGNOSIS — D649 Anemia, unspecified: Secondary | ICD-10-CM | POA: Insufficient documentation

## 2022-03-23 DIAGNOSIS — L0291 Cutaneous abscess, unspecified: Secondary | ICD-10-CM

## 2022-03-23 DIAGNOSIS — L02414 Cutaneous abscess of left upper limb: Secondary | ICD-10-CM | POA: Insufficient documentation

## 2022-03-23 DIAGNOSIS — I10 Essential (primary) hypertension: Secondary | ICD-10-CM | POA: Insufficient documentation

## 2022-03-23 LAB — COMPREHENSIVE METABOLIC PANEL
ALT: 32 U/L (ref 0–44)
AST: 42 U/L — ABNORMAL HIGH (ref 15–41)
Albumin: 3.7 g/dL (ref 3.5–5.0)
Alkaline Phosphatase: 58 U/L (ref 38–126)
Anion gap: 10 (ref 5–15)
BUN: 21 mg/dL — ABNORMAL HIGH (ref 6–20)
CO2: 28 mmol/L (ref 22–32)
Calcium: 8.9 mg/dL (ref 8.9–10.3)
Chloride: 102 mmol/L (ref 98–111)
Creatinine, Ser: 0.96 mg/dL (ref 0.61–1.24)
GFR, Estimated: >60 mL/min (ref >60)
Glucose, Bld: 127 mg/dL — ABNORMAL HIGH (ref 70–99)
Potassium: 3.9 mmol/L (ref 3.5–5.1)
Sodium: 140 mmol/L (ref 135–145)
Total Bilirubin: 0.5 mg/dL (ref 0.3–1.2)
Total Protein: 6.7 g/dL (ref 6.5–8.1)

## 2022-03-23 LAB — CBC WITH DIFFERENTIAL/PLATELET
Abs Immature Granulocytes: 0.01 10*3/uL (ref 0.00–0.07)
Basophils Absolute: 0 10*3/uL (ref 0.0–0.1)
Basophils Relative: 1 %
Eosinophils Absolute: 0.2 10*3/uL (ref 0.0–0.5)
Eosinophils Relative: 4 %
HCT: 35.8 % — ABNORMAL LOW (ref 39.0–52.0)
Hemoglobin: 11.7 g/dL — ABNORMAL LOW (ref 13.0–17.0)
Immature Granulocytes: 0 %
Lymphocytes Relative: 43 %
Lymphs Abs: 2.3 10*3/uL (ref 0.7–4.0)
MCH: 29.2 pg (ref 26.0–34.0)
MCHC: 32.7 g/dL (ref 30.0–36.0)
MCV: 89.3 fL (ref 80.0–100.0)
Monocytes Absolute: 0.6 10*3/uL (ref 0.1–1.0)
Monocytes Relative: 11 %
Neutro Abs: 2.2 10*3/uL (ref 1.7–7.7)
Neutrophils Relative %: 41 %
Platelets: 268 10*3/uL (ref 150–400)
RBC: 4.01 MIL/uL — ABNORMAL LOW (ref 4.22–5.81)
RDW: 13.1 % (ref 11.5–15.5)
WBC: 5.2 10*3/uL (ref 4.0–10.5)
nRBC: 0 % (ref 0.0–0.2)

## 2022-03-23 MED ORDER — SULFAMETHOXAZOLE-TRIMETHOPRIM 800-160 MG PO TABS
1.0000 | ORAL_TABLET | Freq: Two times a day (BID) | ORAL | 0 refills | Status: AC
  Filled 2022-03-23: qty 14, 7d supply, fill #0

## 2022-03-23 MED ORDER — LIDOCAINE-EPINEPHRINE (PF) 2 %-1:200000 IJ SOLN
10.0000 mL | Freq: Once | INTRAMUSCULAR | Status: AC
  Administered 2022-03-23: 10 mL
  Filled 2022-03-23: qty 20

## 2022-03-23 NOTE — ED Triage Notes (Signed)
Pt has an abscess to his left arm x 2 weeks. Pt uses iv heroin and meth.

## 2022-03-23 NOTE — Discharge Instructions (Addendum)
You have been seen today for your complaint of abscess. Your lab work was reassuring. Your discharge medications include Bactrim. This is an antibiotic. You should take it as prescribed. You should take it for the entire duration of the prescription. This may cause an upset stomach. This is normal. You may take this with food. You may also eat yogurt to prevent diarrhea.  . Home care instructions are as follows:  Keep the area clean and dry. Do not inject recreational drugs. Follow up with: a primary care provider of your choice in 7 days. Please seek immediate medical care if you develop any of the following symptoms: You have trouble breathing. You have chest pain. You have any symptoms of a stroke. "BE FAST" is an easy way to remember the main warning signs of a stroke: B - Balance. Signs are dizziness, sudden trouble walking, or loss of balance. E - Eyes. Signs are trouble seeing or a sudden change in vision. F - Face. Signs are sudden weakness or numbness of the face, or the face or eyelid drooping on one side. A - Arms. Signs are weakness or numbness in an arm. This happens suddenly and usually on one side of the body. S - Speech. Signs are sudden trouble speaking, slurred speech, or trouble understanding what people say. T - Time. Time to call emergency services. Write down what time symptoms started. You have other signs of a stroke, such as: A sudden, severe headache with no known cause. Nausea or vomiting. Seizure. At this time there does not appear to be the presence of an emergent medical condition, however there is always the potential for conditions to change. Please read and follow the below instructions.  Do not take your medicine if  develop an itchy rash, swelling in your mouth or lips, or difficulty breathing; call 911 and seek immediate emergency medical attention if this occurs.  You may review your lab tests and imaging results in their entirety on your MyChart account.   Please discuss all results of fully with your primary care provider and other specialist at your follow-up visit.  Note: Portions of this text may have been transcribed using voice recognition software. Every effort was made to ensure accuracy; however, inadvertent computerized transcription errors may still be present.

## 2022-03-23 NOTE — ED Provider Notes (Signed)
[facility] Note   CSN: [id number] Arrival date & time: 03/23/22  0258     History  Chief Complaint  Patient presents with   Abscess    Cody Rios is a 31 y.o. male.  With a history of IV drug use, endocarditis, hypertension, anxiety, depression who presents to the ED for evaluation of an abscess.  He states he noticed abscess to the left antecubital region 2 weeks ago.  States it began draining on its own 1 week ago.  Stopped draining 2 days ago but still has an abscess in the area.  States he uses IV meth and heroin with most recent use yesterday.  States he does not remember injecting into the area of the abscess.  Cover the area with a bandage and believes that he caused a rash around the area due to contact dermatitis.  Denies fevers, chills, cough, chest pain, shortness of breath, pain to the area of the abscess.   Abscess      Home Medications Prior to Admission medications   Medication Sig Start Date End Date Taking? Authorizing Provider  clotrimazole (LOTRIMIN) 1 % cream Apply to feet morning and evening x 2 weeks 09/21/21   Roxy Horseman, PA-C  doxycycline (VIBRAMYCIN) 100 MG capsule Take 1 capsule (100 mg total) by mouth 2 (two) times daily. 02/02/22   Jeanelle Malling, PA  hydrOXYzine (ATARAX) 25 MG tablet Take 1 tablet (25 mg total) by mouth every 6 (six) hours. 01/28/22   Lorre Nick, MD  ibuprofen (ADVIL) 200 MG tablet Take 400-600 mg by mouth every 6 (six) hours as needed for headache or mild pain.    [provider]      Allergies    Cephalosporins, Cipro [ciprofloxacin hcl], and Penicillins    Review of Systems   Review of Systems  Physical Exam Updated Vital Signs BP (!) 138/96   Pulse 73   Temp 97.8 F (36.6 C) (Oral)   Resp 16   Ht 6\' 2"  (1.88 m)   Wt 74.8 kg   SpO2 97%   BMI 21.18 kg/m  Physical Exam Vitals and nursing note reviewed.  Constitutional:      General: He is not in acute  distress.    Appearance: Normal appearance. He is well-developed. He is not ill-appearing, toxic-appearing or diaphoretic.  HENT:     Head: Normocephalic and atraumatic.  Eyes:     Conjunctiva/sclera: Conjunctivae normal.  Cardiovascular:     Rate and Rhythm: Normal rate and regular rhythm.     Pulses: Normal pulses.     Heart sounds: No murmur heard. Pulmonary:     Effort: Pulmonary effort is normal. No respiratory distress.     Breath sounds: Normal breath sounds. No stridor. No wheezing, rhonchi or rales.  Abdominal:     Palpations: Abdomen is soft.     Tenderness: There is no abdominal tenderness.  Musculoskeletal:        General: No swelling.     Cervical back: Neck supple.  Skin:    General: Skin is warm and dry.     Capillary Refill: Capillary refill takes less than 2 seconds.     Comments: 3 cm by 2 cm area of induration to the left AC. Minimal central fluctuance. Surrounding rash reportedly from adhesive bandages.  Neurological:     General: No focal deficit present.     Mental Status: He is alert and oriented to person, place, and time.  Psychiatric:  Mood and Affect: Mood normal.     ED Results / Procedures / Treatments   Labs (all labs ordered are listed, but only abnormal results are displayed) Labs Reviewed  CBC WITH DIFFERENTIAL/PLATELET - Abnormal; Notable for the following components:      Result Value   RBC 4.01 (*)    Hemoglobin 11.7 (*)    HCT 35.8 (*)    All other components within normal limits  COMPREHENSIVE METABOLIC PANEL - Abnormal; Notable for the following components:   Glucose, Bld 127 (*)    BUN 21 (*)    AST 42 (*)    All other components within normal limits    EKG None  Radiology No results found.  Procedures .Marland KitchenIncision and Drainage  Date/Time: 03/23/2022 5:05 PM  Performed by: Michelle Piper, PA-C Authorized by: Michelle Piper, PA-C   Consent:    Consent obtained:  Verbal   Consent given by:  Patient    Risks discussed:  Bleeding, incomplete drainage, pain and damage to other organs   Alternatives discussed:  No treatment Universal protocol:    Procedure explained and questions answered to patient or proxy's satisfaction: yes     Relevant documents present and verified: yes     Test results available : yes     Patient identity confirmed:  Verbally with patient and arm band Location:    Type:  Abscess   Location:  Upper extremity   Upper extremity location:  Arm   Arm location:  L lower arm Pre-procedure details:    Skin preparation:  Chlorhexidine with alcohol and povidone-iodine Sedation:    Sedation type:  None Anesthesia:    Anesthesia method:  Local infiltration   Local anesthetic:  Lidocaine 2% WITH epi Procedure type:    Complexity:  Simple Procedure details:    Incision types:  Single straight   Incision depth:  Subcutaneous   Wound management:  Probed and deloculated, irrigated with saline and extensive cleaning   Drainage:  Purulent and bloody   Drainage amount:  Moderate   Wound treatment:  Wound left open   Packing materials:  None Post-procedure details:    Procedure completion:  Tolerated well, no immediate complications     Medications Ordered in ED Medications - No data to display  ED Course/ Medical Decision Making/ A&P                           Medical Decision Making Amount and/or Complexity of Data Reviewed Labs: ordered.  Risk Prescription drug management.  This patient presents to the ED for concern of abscess to left forearm, this involves an extensive number of treatment options, and is a complaint that carries with it a high risk of complications and morbidity.  The differential diagnosis includes abscess, cellulitis, systemic infection including sepsis or endocarditis   Co morbidities that complicate the patient evaluation  hx of infective endocarditis in 2018  My initial workup includes basic labs, I and D  Additional history obtained  from: Nursing notes from this visit.  I ordered, reviewed and interpreted labs which include: CBC, BMP. No leukocytosis. Stable anemia.   Afebrile, hemodynamically stable. 31 year old male presenting to the ED for evaluation of an abscess. Has been present for 2 weeks. Drained on its own 1 week ago, is still indurated. He is an IV drug user with a history of endocarditis. Location of abscess is concerning for complication of IV drug use.  He has had no fevers, chills, chest pain or shortness of breath. Does not have leukocytosis. There is no murmur on exam. Low suspicion of endocarditis at this time. He will be treated with Bactrim for high risk infection. Was given strict return precautions. Stable at the time of discharge.   At this time there does not appear to be any evidence of an acute emergency medical condition and the patient appears stable for discharge with appropriate outpatient follow up. Diagnosis was discussed with patient who verbalizes understanding of care plan and is agreeable to discharge. I have discussed return precautions with patient who verbalizes understanding. Patient encouraged to follow-up with their PCP within 1 week. All questions answered.  Patient's case discussed with Dr. Criss Alvine who agrees with plan to discharge with follow-up.   Note: Portions of this report may have been transcribed using voice recognition software. Every effort was made to ensure accuracy; however, inadvertent computerized transcription errors may still be present.          Final Clinical Impression(s) / ED Diagnoses Final diagnoses:  None    Rx / DC Orders ED Discharge Orders     None         Michelle Piper, Cordelia Poche 03/23/22 Rhina Brackett, MD 03/24/22 [phone]

## 2022-04-08 ENCOUNTER — Other Ambulatory Visit (HOSPITAL_COMMUNITY): Payer: Self-pay

## 2022-05-23 ENCOUNTER — Emergency Department (HOSPITAL_COMMUNITY)
Admission: EM | Admit: 2022-05-23 | Discharge: 2022-05-24 | Disposition: A | Discharge status: Home / Self Care | Payer: 59 | Attending: Emergency Medicine | Admitting: Emergency Medicine

## 2022-05-23 ENCOUNTER — Other Ambulatory Visit: Payer: Self-pay

## 2022-05-23 DIAGNOSIS — I10 Essential (primary) hypertension: Secondary | ICD-10-CM | POA: Diagnosis not present

## 2022-05-23 DIAGNOSIS — F1910 Other psychoactive substance abuse, uncomplicated: Secondary | ICD-10-CM | POA: Insufficient documentation

## 2022-05-23 DIAGNOSIS — R69 Illness, unspecified: Secondary | ICD-10-CM | POA: Diagnosis not present

## 2022-05-23 DIAGNOSIS — R4182 Altered mental status, unspecified: Secondary | ICD-10-CM | POA: Diagnosis not present

## 2022-05-23 HISTORY — DX: Essential (primary) hypertension: I10

## 2022-05-23 HISTORY — DX: Anxiety disorder, unspecified: F41.9

## 2022-05-23 HISTORY — DX: Unspecified viral hepatitis C without hepatic coma: B19.20

## 2022-05-23 HISTORY — DX: Depression, unspecified: F32.A

## 2022-05-24 ENCOUNTER — Encounter (HOSPITAL_COMMUNITY): Payer: Self-pay

## 2022-05-24 ENCOUNTER — Encounter (HOSPITAL_COMMUNITY): Payer: Self-pay | Admitting: Emergency Medicine

## 2022-05-24 LAB — COMPREHENSIVE METABOLIC PANEL
ALT: 38 U/L (ref 0–44)
AST: 40 U/L (ref 15–41)
Albumin: 4 g/dL (ref 3.5–5.0)
Alkaline Phosphatase: 52 U/L (ref 38–126)
Anion gap: 9 (ref 5–15)
BUN: 19 mg/dL (ref 6–20)
CO2: 26 mmol/L (ref 22–32)
Calcium: 9.1 mg/dL (ref 8.9–10.3)
Chloride: 101 mmol/L (ref 98–111)
Creatinine, Ser: 1 mg/dL (ref 0.61–1.24)
GFR, Estimated: >60 mL/min (ref >60)
Glucose, Bld: 100 mg/dL — ABNORMAL HIGH (ref 70–99)
Potassium: 3.6 mmol/L (ref 3.5–5.1)
Sodium: 136 mmol/L (ref 135–145)
Total Bilirubin: 0.7 mg/dL (ref 0.3–1.2)
Total Protein: 6.9 g/dL (ref 6.5–8.1)

## 2022-05-24 LAB — CBC WITH DIFFERENTIAL/PLATELET
Abs Immature Granulocytes: 0.02 10*3/uL (ref 0.00–0.07)
Basophils Absolute: 0.1 10*3/uL (ref 0.0–0.1)
Basophils Relative: 1 %
Eosinophils Absolute: 0.1 10*3/uL (ref 0.0–0.5)
Eosinophils Relative: 2 %
HCT: 33.8 % — ABNORMAL LOW (ref 39.0–52.0)
Hemoglobin: 11 g/dL — ABNORMAL LOW (ref 13.0–17.0)
Immature Granulocytes: 0 %
Lymphocytes Relative: 29 %
Lymphs Abs: 1.6 10*3/uL (ref 0.7–4.0)
MCH: 29.6 pg (ref 26.0–34.0)
MCHC: 32.5 g/dL (ref 30.0–36.0)
MCV: 90.9 fL (ref 80.0–100.0)
Monocytes Absolute: 0.7 10*3/uL (ref 0.1–1.0)
Monocytes Relative: 13 %
Neutro Abs: 2.9 10*3/uL (ref 1.7–7.7)
Neutrophils Relative %: 55 %
Platelets: 217 10*3/uL (ref 150–400)
RBC: 3.72 MIL/uL — ABNORMAL LOW (ref 4.22–5.81)
RDW: 14.1 % (ref 11.5–15.5)
WBC: 5.4 10*3/uL (ref 4.0–10.5)
nRBC: 0 % (ref 0.0–0.2)

## 2022-05-24 LAB — RAPID URINE DRUG SCREEN, HOSP PERFORMED
Amphetamines: POSITIVE — AB
Barbiturates: NOT DETECTED
Benzodiazepines: NOT DETECTED
Cocaine: NOT DETECTED
Opiates: NOT DETECTED
Tetrahydrocannabinol: NOT DETECTED

## 2022-05-24 LAB — ETHANOL: Alcohol, Ethyl (B): <10 mg/dL (ref <10)

## 2022-05-24 NOTE — ED Notes (Signed)
Pt noted to be 87-88% in triage. Pt arousal to voice, brought back to room 23 for evaluation

## 2022-05-24 NOTE — ED Provider Notes (Signed)
[facility] DEPT Provider Note: Georgena Spurling, MD, FACEP  CSN: [id number] MRN: [id number] ARRIVAL: 05/23/22 at 2326 ROOM: WA23/WA23   CHIEF COMPLAINT  Altered Mental Status  Level 5 caveat: Altered mental status HISTORY OF PRESENT ILLNESS  05/24/22 12:06 AM Cody Rios is a 32 y.o. male (Pseuodonym for registration purposes. Patient's real name is Cody Rios, age 53; the records of [facility] have been reviewed by myself.) who was brought by EMS because bystanders noted was not acting properly.  He told them that he smoked THC about 10 PM.  He tells me he snorted heroin and smoked methamphetamine about 11:30 PM.  He can tell me his name, his birthdate and where he is.  He is somewhat somnolent but arousable.  He has a known history of opioid abuse.  He denies alcohol or THC use to me.   Past Medical History:  Diagnosis Date   Anxiety    Depression    Endocarditis    Hepatitis C test positive    Hypertension     History reviewed. No pertinent surgical history.  No family history on file.  Social History   Substance Use Topics   Drug use: Yes    Prior to Admission medications   Not on File    Allergies Cephalosporins, Ciprofloxacin, and Penicillins   REVIEW OF SYSTEMS  Level 5 caveat   PHYSICAL EXAMINATION  Initial Vital Signs Blood pressure (!) 156/92, pulse 93, temperature 98.2 F (36.8 C), temperature source Oral, resp. rate 16, SpO2 (S) (!) 87 %.  Examination General: Well-developed, well-nourished male in no acute distress; appearance consistent with age of record HENT: normocephalic; atraumatic Eyes: pupils equal, round and sluggish; extraocular muscles Leslie intact Neck: supple Heart: regular rate and rhythm Lungs: clear to auscultation bilaterally Abdomen: soft; nondistended; nontender; bowel sounds present Extremities: No deformity; full range of motion; pulses normal Neurologic: Somnolent but arousable; oriented x 2;  dysarthria; motor function intact in all extremities and symmetric; no facial droop Skin: Warm and dry Psychiatric: Normal mood and affect   RESULTS  Summary of this visit's results, reviewed and interpreted by myself:   EKG Interpretation  Date/Time:    Ventricular Rate:    PR Interval:    QRS Duration:   QT Interval:    QTC Calculation:   R Axis:     Text Interpretation:         Laboratory Studies: Results for orders placed or performed during the hospital encounter of 05/23/22 (from the past 24 hour(s))  Ethanol     Status: None   Collection Time: 05/24/22 12:14 AM  Result Value Ref Range   Alcohol, Ethyl (B) <10 <10 mg/dL  Comprehensive metabolic panel     Status: Abnormal   Collection Time: 05/24/22 12:14 AM  Result Value Ref Range   Sodium 136 135 - 145 mmol/L   Potassium 3.6 3.5 - 5.1 mmol/L   Chloride 101 98 - 111 mmol/L   CO2 26 22 - 32 mmol/L   Glucose, Bld 100 (H) 70 - 99 mg/dL   BUN 19 6 - 20 mg/dL   Creatinine, Ser 1.00 0.61 - 1.24 mg/dL   Calcium 9.1 8.9 - 10.3 mg/dL   Total Protein 6.9 6.5 - 8.1 g/dL   Albumin 4.0 3.5 - 5.0 g/dL   AST 40 15 - 41 U/L   ALT 38 0 - 44 U/L   Alkaline Phosphatase 52 38 - 126 U/L   Total Bilirubin 0.7 0.3 - 1.2 mg/dL  GFR, Estimated >60 >60 mL/min   Anion gap 9 5 - 15  CBC with Differential     Status: Abnormal   Collection Time: 05/24/22 12:14 AM  Result Value Ref Range   WBC 5.4 4.0 - 10.5 K/uL   RBC 3.72 (L) 4.22 - 5.81 MIL/uL   Hemoglobin 11.0 (L) 13.0 - 17.0 g/dL   HCT 33.8 (L) 39.0 - 52.0 %   MCV 90.9 80.0 - 100.0 fL   MCH 29.6 26.0 - 34.0 pg   MCHC 32.5 30.0 - 36.0 g/dL   RDW 14.1 11.5 - 15.5 %   Platelets 217 150 - 400 K/uL   nRBC 0.0 0.0 - 0.2 %   Neutrophils Relative % 55 %   Neutro Abs 2.9 1.7 - 7.7 K/uL   Lymphocytes Relative 29 %   Lymphs Abs 1.6 0.7 - 4.0 K/uL   Monocytes Relative 13 %   Monocytes Absolute 0.7 0.1 - 1.0 K/uL   Eosinophils Relative 2 %   Eosinophils Absolute 0.1 0.0 - 0.5 K/uL    Basophils Relative 1 %   Basophils Absolute 0.1 0.0 - 0.1 K/uL   Immature Granulocytes 0 %   Abs Immature Granulocytes 0.02 0.00 - 0.07 K/uL  Rapid urine drug screen (hospital performed)     Status: Abnormal   Collection Time: 05/24/22 12:39 AM  Result Value Ref Range   Opiates NONE DETECTED NONE DETECTED   Cocaine NONE DETECTED NONE DETECTED   Benzodiazepines NONE DETECTED NONE DETECTED   Amphetamines POSITIVE (A) NONE DETECTED   Tetrahydrocannabinol NONE DETECTED NONE DETECTED   Barbiturates NONE DETECTED NONE DETECTED   Imaging Studies: No results found.  ED COURSE and MDM  Nursing notes, initial and subsequent vitals signs, including pulse oximetry, reviewed and interpreted by myself.  Vitals:   05/23/22 2336 05/23/22 2337 05/24/22 0000  BP:  (!) 156/92   Pulse: (!) 106 93   Resp:  16   Temp:  98.2 F (36.8 C)   TempSrc:  Oral   SpO2: 94% 94% (S) (!) 87%   Medications - No data to display  1:34 AM Patient now awake and alert.  He is ambulatory.  He has been able to contact a friend who is at the bedside and will give him a ride.  PROCEDURES  Procedures   ED DIAGNOSES     ICD-10-CM   1. Polysubstance abuse ([place])  F19.10          Kinslea Frances, MD 05/24/22 [phone]

## 2022-06-27 ENCOUNTER — Emergency Department (HOSPITAL_COMMUNITY)
Admission: EM | Admit: 2022-06-27 | Discharge: 2022-06-27 | Discharge status: Against Medical Advice | Payer: 59 | Attending: Emergency Medicine | Admitting: Emergency Medicine

## 2022-06-27 ENCOUNTER — Encounter (HOSPITAL_COMMUNITY): Payer: Self-pay

## 2022-06-27 ENCOUNTER — Emergency Department (HOSPITAL_COMMUNITY): Payer: 59

## 2022-06-27 DIAGNOSIS — R0602 Shortness of breath: Secondary | ICD-10-CM | POA: Diagnosis not present

## 2022-06-27 DIAGNOSIS — M7989 Other specified soft tissue disorders: Secondary | ICD-10-CM | POA: Diagnosis not present

## 2022-06-27 DIAGNOSIS — R079 Chest pain, unspecified: Secondary | ICD-10-CM

## 2022-06-27 DIAGNOSIS — R0789 Other chest pain: Secondary | ICD-10-CM | POA: Diagnosis not present

## 2022-06-27 DIAGNOSIS — Z8679 Personal history of other diseases of the circulatory system: Secondary | ICD-10-CM | POA: Diagnosis not present

## 2022-06-27 DIAGNOSIS — R6883 Chills (without fever): Secondary | ICD-10-CM | POA: Diagnosis not present

## 2022-06-27 DIAGNOSIS — Z79899 Other long term (current) drug therapy: Secondary | ICD-10-CM | POA: Insufficient documentation

## 2022-06-27 LAB — TROPONIN I (HIGH SENSITIVITY)
Troponin I (High Sensitivity): 4 ng/L (ref <18)
Troponin I (High Sensitivity): 4 ng/L (ref <18)

## 2022-06-27 LAB — CBC
HCT: 42.3 % (ref 39.0–52.0)
Hemoglobin: 14 g/dL (ref 13.0–17.0)
MCH: 29.5 pg (ref 26.0–34.0)
MCHC: 33.1 g/dL (ref 30.0–36.0)
MCV: 89.2 fL (ref 80.0–100.0)
Platelets: 273 10*3/uL (ref 150–400)
RBC: 4.74 MIL/uL (ref 4.22–5.81)
RDW: 13.8 % (ref 11.5–15.5)
WBC: 5.8 10*3/uL (ref 4.0–10.5)
nRBC: 0 % (ref 0.0–0.2)

## 2022-06-27 LAB — BASIC METABOLIC PANEL
Anion gap: 9 (ref 5–15)
BUN: 21 mg/dL — ABNORMAL HIGH (ref 6–20)
CO2: 25 mmol/L (ref 22–32)
Calcium: 8.7 mg/dL — ABNORMAL LOW (ref 8.9–10.3)
Chloride: 104 mmol/L (ref 98–111)
Creatinine, Ser: 0.9 mg/dL (ref 0.61–1.24)
GFR, Estimated: >60 mL/min (ref >60)
Glucose, Bld: 137 mg/dL — ABNORMAL HIGH (ref 70–99)
Potassium: 3.8 mmol/L (ref 3.5–5.1)
Sodium: 138 mmol/L (ref 135–145)

## 2022-06-27 LAB — RAPID URINE DRUG SCREEN, HOSP PERFORMED
Amphetamines: POSITIVE — AB
Barbiturates: NOT DETECTED
Benzodiazepines: NOT DETECTED
Cocaine: NOT DETECTED
Opiates: NOT DETECTED
Tetrahydrocannabinol: NOT DETECTED

## 2022-06-27 LAB — D-DIMER, QUANTITATIVE: D-Dimer, Quant: 0.38 ug/mL-FEU (ref 0.00–0.50)

## 2022-06-27 MED ORDER — VANCOMYCIN HCL 2000 MG/400ML IV SOLN
2000.0000 mg | Freq: Once | INTRAVENOUS | Status: DC
  Filled 2022-06-27: qty 400

## 2022-06-27 MED ORDER — SODIUM CHLORIDE 0.9 % IV SOLN
2.0000 g | Freq: Once | INTRAVENOUS | Status: AC
  Administered 2022-06-27: 2 g via INTRAVENOUS
  Filled 2022-06-27: qty 12.5

## 2022-06-27 NOTE — Progress Notes (Signed)
A consult was received from an ED physician for vancomycin and cefepime per pharmacy dosing.  The patient's profile has been reviewed for ht/wt/allergies/indication/available labs.    A one time order has been placed for: -Cefepime 2 gm IV x 1 dose -Vancomycin 2 gm IV x 1 dose  Further antibiotics/pharmacy consults should be ordered by admitting physician if indicated.                       Thank you, Heywood Footman 06/27/2022  4:55 PM

## 2022-06-27 NOTE — ED Notes (Signed)
Patient decided to leave AMA, spoke with provider and understands risks of leaving. Patient given paperwork, verbalized understanding. IV removed with catheter intact. Patient ambulatory out of ED by self.

## 2022-06-27 NOTE — ED Provider Notes (Signed)
[facility] Provider Note   CSN: [id number] Arrival date & time: 06/27/22  1123     History  Chief Complaint  Patient presents with   Chest Pain   Arm Swelling    Cody Rios is a 32 y.o. male.   Chest Pain    32 year old male with medical history significant for endocarditis, polysubstance abuse and obesity disorder, recently last injected 1 week ago, now on Suboxone, anxiety, depression who presents emergency department with chest pain.  He endorses chills. He also states that he developed right arm swelling this morning.  Endorses some pain upon chest wall expansion, denies any pain with movement, denies any musculoskeletal chest wall pain, denies any rash.  No known fevers.  Did have shortness of breath earlier this morning with associated chest discomfort.  Does confirm that he last used 1 week ago.   Home Medications Prior to Admission medications   Medication Sig Start Date End Date Taking? Authorizing Provider  clotrimazole (LOTRIMIN) 1 % cream Apply to feet morning and evening x 2 weeks 09/21/21   Montine Circle, PA-C  doxycycline (VIBRAMYCIN) 100 MG capsule Take 1 capsule (100 mg total) by mouth 2 (two) times daily. 02/02/22   Rex Kras, PA  hydrOXYzine (ATARAX) 25 MG tablet Take 1 tablet (25 mg total) by mouth every 6 (six) hours. 01/28/22   Lacretia Leigh, MD  ibuprofen (ADVIL) 200 MG tablet Take 400-600 mg by mouth every 6 (six) hours as needed for headache or mild pain.    [provider]      Allergies    Cephalosporins, Cipro [ciprofloxacin hcl], Penicillins, Cephalosporins, Ciprofloxacin, and Penicillins    Review of Systems   Review of Systems  Constitutional:  Positive for chills.  Cardiovascular:  Positive for chest pain.    Physical Exam Updated Vital Signs BP (!) 141/91   Pulse 70   Temp 97.9 F (36.6 C)   Resp 20   Ht 6\' 1"  (1.854 m)   Wt 74.8 kg   SpO2 100%   BMI 21.77 kg/m   Physical Exam Vitals and nursing note reviewed.  Constitutional:      General: He is not in acute distress.    Appearance: He is well-developed.  HENT:     Head: Normocephalic and atraumatic.  Eyes:     Conjunctiva/sclera: Conjunctivae normal.  Cardiovascular:     Rate and Rhythm: Normal rate and regular rhythm.     Heart sounds: Murmur heard.     Comments: Faint intermittent systolic murmur appreciated Pulmonary:     Effort: Pulmonary effort is normal. No respiratory distress.     Breath sounds: Normal breath sounds.  Abdominal:     Palpations: Abdomen is soft.     Tenderness: There is no abdominal tenderness.  Musculoskeletal:        General: No swelling.     Cervical back: Neck supple.     Right lower leg: No edema.     Left lower leg: No edema.     Comments: No upper extremity swelling, no evidence of erythema or fluctuance  Skin:    General: Skin is warm and dry.     Capillary Refill: Capillary refill takes less than 2 seconds.     Comments: No  osler nodes  Neurological:     General: No focal deficit present.     Mental Status: He is alert and oriented to person, place, and time. Mental status is at baseline.  Psychiatric:        Mood and Affect: Mood normal.     ED Results / Procedures / Treatments   Labs (all labs ordered are listed, but only abnormal results are displayed) Labs Reviewed  BASIC METABOLIC PANEL - Abnormal; Notable for the following components:      Result Value   Glucose, Bld 137 (*)    BUN 21 (*)    Calcium 8.7 (*)    All other components within normal limits  RAPID URINE DRUG SCREEN, HOSP PERFORMED - Abnormal; Notable for the following components:   Amphetamines POSITIVE (*)    All other components within normal limits  CULTURE, BLOOD (ROUTINE X 2)  CULTURE, BLOOD (ROUTINE X 2)  CULTURE, BLOOD (SINGLE)  CBC  D-DIMER, QUANTITATIVE  TROPONIN I (HIGH SENSITIVITY)  TROPONIN I (HIGH SENSITIVITY)    EKG EKG  Interpretation  Date/Time:  Monday June 27 2022 11:33:05 EDT Ventricular Rate:  77 PR Interval:  128 QRS Duration: 98 QT Interval:  391 QTC Calculation: 443 R Axis:   86 Text Interpretation: Sinus rhythm RSR' in V1 or V2, right VCD or RVH Probable left ventricular hypertrophy ST elev, probable normal early repol pattern Baseline wander in lead(s) V1 No significant change since last tracing Confirmed by Regan Lemming ([id number]) on 06/27/2022 3:10:18 PM  Radiology DG Chest 2 View  Result Date: 06/27/2022 CLINICAL DATA:  Chest pain. EXAM: CHEST - 2 VIEW COMPARISON:  May 14, 2021. FINDINGS: The heart size and mediastinal contours are within normal limits. Mild right basilar scarring is noted. No acute pulmonary disease is noted. The visualized skeletal structures are unremarkable. IMPRESSION: No active cardiopulmonary disease. Electronically Signed   By: Marijo Conception M.D.   On: 06/27/2022 11:57    Procedures Procedures    Medications Ordered in ED Medications  vancomycin (VANCOREADY) IVPB 2000 mg/400 mL (has no administration in time range)  ceFEPIme (MAXIPIME) 2 g in sodium chloride 0.9 % 100 mL IVPB (2 g Intravenous New Bag/Given 06/27/22 1801)    ED Course/ Medical Decision Making/ A&P                             Medical Decision Making Amount and/or Complexity of Data Reviewed Labs: ordered. Radiology: ordered.  Risk Prescription drug management.     32 year old male with medical history significant for endocarditis, polysubstance abuse and obesity disorder, recently last injected 1 week ago, now on Suboxone, anxiety, depression who presents emergency department with chest pain.  He endorses chills. He also states that he developed right arm swelling this morning.  Endorses some pain upon chest wall expansion, denies any pain with movement, denies any musculoskeletal chest wall pain, denies any rash.  No known fevers.  Did have shortness of breath earlier this morning with  associated chest discomfort.  Does confirm that he last used 1 week ago.   On arrival, the patient was afebrile, not tachycardic or tachypneic, BP 142/98, saturating 100% on room air.  Physical exam significant for an intermittent systolic murmur, faintly appreciated.  Given the patient's chest pain, history of IV drug abuse, most recently using 1 week ago, chills, history of endocarditis, considered recurrent endocarditis.  Blood cultures x 3 were collected and the patient was started on IV vancomycin and IV cefepime. Considered PE, less likely ACS, GERD, anxiety.  No chest wall tenderness on exam, no rash.  Initial EKG revealed sinus rhythm, ventricular rate 77, nonspecific ST  changes, likely early repolarization, no STEMI.  A chest x-ray revealed no acute cardiac or pulmonary disease.  A BMP was generally unremarkable, UDS was positive for amphetamines, initial troponin was 4, repeat also 4, D-dimer was negative and CBC was without a leukocytosis or anemia.  No evidence for ACS, low concern for PE with a normal D-dimer, no evidence for pneumonia or pneumothorax.  I discussed admission for observation with the patient bedside given his history as per above.  After consideration of admission, the patient would prefer to be discharged [place].  The patient understood the consequences of potentially leaving without a diagnosis of bacterial endocarditis which could include significant worsening of his medical condition, disability and ultimately death.  I have provided strict return precautions in the event of worsening of his symptoms.  Patient was subsequently discharged [place].  Final Clinical Impression(s) / ED Diagnoses Final diagnoses:  Chest pain, unspecified type  History of endocarditis  Chills    Rx / DC Orders ED Discharge Orders     None         Regan Lemming, MD 06/27/22 1944

## 2022-06-27 NOTE — ED Notes (Signed)
Unable to get Blood work from Pt. X2 Attempts.

## 2022-06-27 NOTE — ED Triage Notes (Signed)
Pt arrived via POV, c/o right sided chest pain, worsening with deep breathing, back pain and right arm pain and swelling. States chest pain has been on and off for about a year, this pain started today while at rest. Denies any injection at site of swelling. States trouble breathing this morning with chest pain.   States recent quitting of IV drug use a couple weeks ago. Started on suboxone.

## 2022-06-27 NOTE — Discharge Instructions (Signed)
It was recommended that you be admitted for ultrasound to further evaluate for bacterial endocarditis given your history of injection drug abuse and your history of endocarditis however after discussion of the risks of leaving, you have made the decision to leave [facility].

## 2022-07-02 LAB — CULTURE, BLOOD (ROUTINE X 2)
Culture: NO GROWTH
Culture: NO GROWTH

## 2022-10-19 ENCOUNTER — Other Ambulatory Visit: Payer: Self-pay

## 2022-10-19 ENCOUNTER — Ambulatory Visit (INDEPENDENT_AMBULATORY_CARE_PROVIDER_SITE_OTHER): Payer: Self-pay | Admitting: Student

## 2022-10-19 ENCOUNTER — Other Ambulatory Visit (HOSPITAL_COMMUNITY): Payer: Self-pay

## 2022-10-19 VITALS — BP 139/91 | HR 73 | Temp 98.3°F | Ht 74.0 in | Wt 167.0 lb

## 2022-10-19 DIAGNOSIS — F1121 Opioid dependence, in remission: Secondary | ICD-10-CM

## 2022-10-19 MED ORDER — BUPRENORPHINE HCL-NALOXONE HCL 8-2 MG SL SUBL
0.5000 | SUBLINGUAL_TABLET | Freq: Two times a day (BID) | SUBLINGUAL | 0 refills | Status: DC
  Filled 2022-10-19: qty 7, 7d supply, fill #0

## 2022-10-19 NOTE — Assessment & Plan Note (Addendum)
Patient has a history of opioid use disorder presented to the office today to restart his Suboxone.  He reports his drug of choice is heroin and methamphetamine, says his Meth last use was this morning prior to his visit to the office.  He could not quantify how much he uses , says he uses what he lays his hands on the street.  Patient reports he feels the need to get his heroine cravings and check and that is why he wants to restart his Suboxone monthly.  No signs of withdrawal, not confused not anxious not agitated, has a normal mood, and responding appropriate to questions.  Patient reports taking 8-2 mg of Suboxone on the street daily.  Will start patient on 8-2 mg of Suboxone half tabs twice daily for 7 days call increased -Order Toxassure - Re-prescribe Saboxone 8-2mg  half tab twice daily for 7 day  - Follow up in 7 days

## 2022-10-19 NOTE — Patient Instructions (Addendum)
Thank you, [name] for allowing [place] to provide your care today. Today we discussed your general health and drug cessation needs  . Will prescribe you Suboxone .Take half a tab twice a day for 7 days and come back to see [place] .   I have ordered the following labs for you:  Lab Orders         ToxAssure Select,+Antidepr,UR      Tests ordered today:    Referrals ordered today:   Referral Orders  No referral(s) requested today     I have ordered the following medication/changed the following medications:   Stop the following medications: Medications Discontinued During This Encounter  Medication Reason   ibuprofen (ADVIL) 200 MG tablet No longer needed (for PRN medications)   doxycycline (VIBRAMYCIN) 100 MG capsule One time medication     Start the following medications: No orders of the defined types were placed in this encounter.    Follow up: one week    Remember:  Should you have any questions or concerns please call the internal medicine clinic at [phone].    Kathleen Lime, M.D [facility]

## 2022-10-19 NOTE — Progress Notes (Signed)
CC: OUD disorder  HPI:  Cody Rios is a 32 y.o. male living with a history stated below and presents today for OUD disorder. Please see problem based assessment and plan for additional details.  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 12 tablet 0   No current facility-administered medications on file prior to visit.    Family History  Problem Relation Age of Onset   Hypertension Father     Social History   Socioeconomic History   Marital status: Single    Spouse name: Not on file   Number of children: Not on file   Years of education: Not on file   Highest education level: Not on file  Occupational History   Not on file  Tobacco Use   Smoking status: Not on file   Smokeless tobacco: Former    Types: Chew    Quit date: 03/05/2019  Vaping Use   Vaping status: Never Used  Substance and Sexual Activity   Alcohol use: No   Drug use: Yes    Types: IV, Heroin    Comment: Last used: 11/09/16   Sexual activity: Not on file  Other Topics Concern   Not on file  Social History Narrative   ** Merged History Encounter **       Social Determinants of Health   Financial Resource Strain: Not on file  Food Insecurity: Not on file  Transportation Needs: Not on file  Physical Activity: Not on file  Stress: Not on file  Social Connections: Unknown (08/04/2021)   Received from  [facility]   Social Network    Social Network: Not on file  Intimate Partner Violence: Unknown (07/05/2021)   Received from [facility]   HITS    Physically Hurt: Not on file    Insult or Talk Down To: Not on file    Threaten Physical Harm: Not on file    Scream or Curse: Not on file    Review of Systems: ROS negative except for what is noted on the assessment and plan.  Vitals:   10/19/22 1624  BP: (!) 139/91  Pulse: 73  Temp: 98.3 F (36.8 C)  TempSrc: Oral  SpO2: 98%  Weight: 167 lb (75.8 kg)  Height: 6\' 2"  (1.88 m)    Physical Exam: Constitutional: Well-appearing young man sitting comfortably in the chair  HENT: Eyes: Pupils are round and symmetric no signs of constriction  cardiovascular: No abnormal heart sounds was appreciated normal S1-S2  pulmonary/Chest: normal work of breathing on room air, lungs clear to auscultation bilaterally Abdominal: soft, non-tender, non-distended Psych: No signs of withdrawal, not confused not anxious not agitated, has a normal mood, and responding appropriate to questions.  Assessment & Plan:   Severe opioid use disorder, in early remission [facility]) Patient has a history of opioid  use disorder presented to the office today to restart his Suboxone.  He reports his drug of choice is heroin and methamphetamine, says his Meth last use was this morning prior to his visit to the office.  He could not quantify how much he uses , says he uses what he lays his hands on the street.  Patient reports he feels the need to get his heroine cravings and check and that is why he wants to restart his Suboxone monthly.  No signs of withdrawal, not confused not anxious not agitated, has a normal mood, and responding appropriate to questions.  Patient reports taking 8-2 mg of Suboxone on the street daily.  Will start patient on 8-2 mg of Suboxone half tabs twice daily for 7 days call increased -Order Toxassure - Re prescribe Saboxone 8-2mg  half tab twice daily  for 7 day  - Follow up ion 7 days    Patient seen with Dr. Bayard Beaver, M.D [facility] Phone: [phone] Date 10/19/2022 Time 5:26 PM

## 2022-10-21 ENCOUNTER — Emergency Department (HOSPITAL_COMMUNITY): Admission: EM | Admit: 2022-10-21 | Discharge: 2022-10-21 | Discharge status: Against Medical Advice | Payer: Self-pay

## 2022-10-21 NOTE — ED Notes (Signed)
Pt not answering for triage  

## 2022-10-23 LAB — TOXASSURE SELECT,+ANTIDEPR,UR

## 2022-11-01 ENCOUNTER — Other Ambulatory Visit (HOSPITAL_COMMUNITY): Payer: Self-pay

## 2022-11-03 NOTE — Progress Notes (Signed)
Internal Medicine Clinic Attending  I was physically present during the key portions of the resident provided service and participated in the medical decision making of patient's management care. I reviewed pertinent patient test results.  The assessment, diagnosis, and plan were formulated together and I agree with the documentation in the resident's note.  Narendra, Nischal, MD  

## 2023-09-20 ENCOUNTER — Emergency Department (HOSPITAL_BASED_OUTPATIENT_CLINIC_OR_DEPARTMENT_OTHER)
Admission: EM | Admit: 2023-09-20 | Discharge: 2023-09-21 | Disposition: A | Discharge status: Home / Self Care | Payer: Self-pay | Attending: Emergency Medicine | Admitting: Emergency Medicine

## 2023-09-20 ENCOUNTER — Encounter (HOSPITAL_BASED_OUTPATIENT_CLINIC_OR_DEPARTMENT_OTHER): Payer: Self-pay

## 2023-09-20 ENCOUNTER — Other Ambulatory Visit: Payer: Self-pay

## 2023-09-20 DIAGNOSIS — F419 Anxiety disorder, unspecified: Secondary | ICD-10-CM | POA: Insufficient documentation

## 2023-09-20 DIAGNOSIS — Z711 Person with feared health complaint in whom no diagnosis is made: Secondary | ICD-10-CM

## 2023-09-20 DIAGNOSIS — I10 Essential (primary) hypertension: Secondary | ICD-10-CM | POA: Diagnosis not present

## 2023-09-20 DIAGNOSIS — S60412A Abrasion of right middle finger, initial encounter: Secondary | ICD-10-CM | POA: Diagnosis not present

## 2023-09-20 DIAGNOSIS — W228XXA Striking against or struck by other objects, initial encounter: Secondary | ICD-10-CM | POA: Insufficient documentation

## 2023-09-20 DIAGNOSIS — S6991XA Unspecified injury of right wrist, hand and finger(s), initial encounter: Secondary | ICD-10-CM | POA: Diagnosis present

## 2023-09-20 NOTE — ED Triage Notes (Signed)
 Pt reports his cat was either bit by a baby copperhead or had snake venom on cats hair when patient went to pet cat and believes he got snake venom in a cut that he has on his rt middle finger. Pt denies any pain on finger. Pt reports tingling in hands and feet, chest feels tight, and difficulty hearing in right ear. This occurred approximately 1hr ago. NAD noted in triage.

## 2023-09-20 NOTE — Discharge Instructions (Signed)
 Today you were seen for a possible venom exposure.  Please keep the area clean.  Please return to the ED if you have streaking redness up your hand or uncontrollable vomiting.  Thank you for letting us  treat you today. After performing a physical exam, I feel you are safe to go home. Please follow up with your PCP in the next several days and provide them with your records from this visit. Return to the Emergency Room if pain becomes severe or symptoms worsen.

## 2023-09-20 NOTE — ED Provider Notes (Signed)
 [name] Provider Note   CSN: [id number] Arrival date & time: 09/20/23  2235     Patient presents with: Snake Bite   Cody Rios is a 33 y.o. male.  With a past medical history significant for endocarditis, opioid use disorder, anxiety, hypertension, and hep C presents today for possible snake venom exposure.  Patient states his cat was either bit by a baby copperhead or had snake venom on the cat's hair when the patient went to pet the cat and believes that the snake venom got into a cut on his right middle finger.  Patient denies any pain on the finger.  Patient reports tingling in hands and feet, chest tightness, and difficulty hearing in his right ear.  Exposure occurred approximately 1 hour prior to arrival.   HPI     Prior to Admission medications   Medication Sig Start Date End Date Taking? Authorizing Provider  buprenorphine -naloxone  (SUBOXONE ) 8-2 mg SUBL SL tablet Place 1/2 tablet under the tongue 2 (two) times daily. 10/19/22   Amoako, Prince, MD  clotrimazole  (LOTRIMIN ) 1 % cream Apply to feet morning and evening x 2 weeks 09/21/21   Vicky Charleston, PA-C  hydrOXYzine  (ATARAX ) 25 MG tablet Take 1 tablet (25 mg total) by mouth every 6 (six) hours. 01/28/22   Dasie Faden, MD    Allergies: Cephalosporins, Cipro  [ciprofloxacin  hcl], Penicillins, Cephalosporins, Ciprofloxacin , and Penicillins    Review of Systems  HENT:  Positive for hearing loss.   Respiratory:  Positive for chest tightness.   Neurological:  Positive for numbness.    Updated Vital Signs BP (!) 146/109   Pulse (!) 102   Temp 98 F (36.7 C) (Oral)   Resp 15   Ht 6' 2 (1.88 m)   Wt 74.8 kg   SpO2 99%   BMI 21.18 kg/m   Physical Exam Vitals and nursing note reviewed.  Constitutional:      General: He is not in acute distress.    Appearance: Normal appearance. He is well-developed. He is not ill-appearing.  HENT:     Head: Normocephalic  and atraumatic.     Right Ear: External ear normal.     Left Ear: External ear normal.   Eyes:     Conjunctiva/sclera: Conjunctivae normal.    Cardiovascular:     Rate and Rhythm: Normal rate and regular rhythm.     Pulses: Normal pulses.     Heart sounds: Normal heart sounds. No murmur heard. Pulmonary:     Effort: Pulmonary effort is normal. No respiratory distress.     Breath sounds: Normal breath sounds.  Abdominal:     Palpations: Abdomen is soft.     Tenderness: There is no abdominal tenderness.   Musculoskeletal:        General: No swelling.     Cervical back: Neck supple.   Skin:    General: Skin is warm and dry.     Capillary Refill: Capillary refill takes less than 2 seconds.     Comments: Minor, scabbed over abrasion to patient's right middle first phalanx on the dorsal aspect.  No erythema or discharge noted.  Patient neurovascularly intact.  +2 radial pulses.   Neurological:     General: No focal deficit present.     Mental Status: He is alert and oriented to person, place, and time.   Psychiatric:        Mood and Affect: Mood is anxious.     (all labs  ordered are listed, but only abnormal results are displayed) Labs Reviewed - No data to display  EKG: EKG Interpretation Date/Time:  Wednesday September 20 2023 22:51:04 EDT Ventricular Rate:  91 PR Interval:  116 QRS Duration:  100 QT Interval:  365 QTC Calculation: 450 R Axis:   88  Text Interpretation: Sinus rhythm Borderline short PR interval RSR' in V1 or V2, right VCD or RVH Probable left ventricular hypertrophy ST elev, probable normal early repol pattern Confirmed by Geroldine Berg ([id number]) on 09/20/2023 11:04:22 PM  Radiology: No results found.   Procedures   Medications Ordered in the ED - No data to display                                  Medical Decision Making  This patient presents to the ED for concern of snake venom exposure differential diagnosis includes snakebite, anxiety,  possible exposure  Problem List / ED Course:  EKG with sinus rhythm Nursing staff contacted poison control and the recommendation was to observe the patient for swelling and increased pain and elevate the extremity.  They felt the patient was safe for discharge after observation period. Considered for admission or further workup however patient's vital signs and physical exam are reassuring.  Patient has no open wound where he states that the snake venom touched.  Patient given return precautions.  I feel patient is safe for discharge at this time.       Final diagnoses:  Feared complaint without diagnosis    ED Discharge Orders     None          Cody Rios 09/20/23 2356    Geroldine Berg, MD 09/21/23 [id number]

## 2023-09-20 NOTE — ED Notes (Addendum)
 Writer called poison control and was recommended to observe for swelling and increased pain for next couple hours as well as elevated affected extremity. If symptoms not worsening then okay to discharge after observation period. Pt also instructed to clean area well with soap and water.

## 2023-09-24 ENCOUNTER — Emergency Department (HOSPITAL_BASED_OUTPATIENT_CLINIC_OR_DEPARTMENT_OTHER)
Admission: EM | Admit: 2023-09-24 | Discharge: 2023-09-24 | Disposition: A | Discharge status: Home / Self Care | Payer: Self-pay | Attending: Emergency Medicine | Admitting: Emergency Medicine

## 2023-09-24 ENCOUNTER — Emergency Department (HOSPITAL_BASED_OUTPATIENT_CLINIC_OR_DEPARTMENT_OTHER): Payer: Self-pay

## 2023-09-24 ENCOUNTER — Other Ambulatory Visit: Payer: Self-pay

## 2023-09-24 ENCOUNTER — Encounter (HOSPITAL_BASED_OUTPATIENT_CLINIC_OR_DEPARTMENT_OTHER): Payer: Self-pay

## 2023-09-24 DIAGNOSIS — S0990XA Unspecified injury of head, initial encounter: Secondary | ICD-10-CM | POA: Diagnosis present

## 2023-09-24 DIAGNOSIS — S01112A Laceration without foreign body of left eyelid and periocular area, initial encounter: Secondary | ICD-10-CM | POA: Insufficient documentation

## 2023-09-24 DIAGNOSIS — I10 Essential (primary) hypertension: Secondary | ICD-10-CM | POA: Insufficient documentation

## 2023-09-24 DIAGNOSIS — Z5900 Homelessness unspecified: Secondary | ICD-10-CM | POA: Insufficient documentation

## 2023-09-24 DIAGNOSIS — Z87891 Personal history of nicotine dependence: Secondary | ICD-10-CM | POA: Diagnosis not present

## 2023-09-24 DIAGNOSIS — R21 Rash and other nonspecific skin eruption: Secondary | ICD-10-CM | POA: Insufficient documentation

## 2023-09-24 DIAGNOSIS — Z23 Encounter for immunization: Secondary | ICD-10-CM | POA: Diagnosis not present

## 2023-09-24 LAB — COMPREHENSIVE METABOLIC PANEL WITH GFR
ALT: 23 U/L (ref 0–44)
AST: 40 U/L (ref 15–41)
Albumin: 4.6 g/dL (ref 3.5–5.0)
Alkaline Phosphatase: 55 U/L (ref 38–126)
Anion gap: 13 (ref 5–15)
BUN: 15 mg/dL (ref 6–20)
CO2: 26 mmol/L (ref 22–32)
Calcium: 9.8 mg/dL (ref 8.9–10.3)
Chloride: 105 mmol/L (ref 98–111)
Creatinine, Ser: 1.34 mg/dL — ABNORMAL HIGH (ref 0.61–1.24)
GFR, Estimated: >60 mL/min (ref >60)
Glucose, Bld: 87 mg/dL (ref 70–99)
Potassium: 3.6 mmol/L (ref 3.5–5.1)
Sodium: 144 mmol/L (ref 135–145)
Total Bilirubin: 0.7 mg/dL (ref 0.0–1.2)
Total Protein: 7.1 g/dL (ref 6.5–8.1)

## 2023-09-24 LAB — CBC WITH DIFFERENTIAL/PLATELET
Abs Immature Granulocytes: 0.01 10*3/uL (ref 0.00–0.07)
Basophils Absolute: 0.1 10*3/uL (ref 0.0–0.1)
Basophils Relative: 1 %
Eosinophils Absolute: 0.5 10*3/uL (ref 0.0–0.5)
Eosinophils Relative: 7 %
HCT: 40.1 % (ref 39.0–52.0)
Hemoglobin: 13.7 g/dL (ref 13.0–17.0)
Immature Granulocytes: 0 %
Lymphocytes Relative: 38 %
Lymphs Abs: 2.5 10*3/uL (ref 0.7–4.0)
MCH: 30.4 pg (ref 26.0–34.0)
MCHC: 34.2 g/dL (ref 30.0–36.0)
MCV: 88.9 fL (ref 80.0–100.0)
Monocytes Absolute: 0.7 10*3/uL (ref 0.1–1.0)
Monocytes Relative: 11 %
Neutro Abs: 2.8 10*3/uL (ref 1.7–7.7)
Neutrophils Relative %: 43 %
Platelets: 257 10*3/uL (ref 150–400)
RBC: 4.51 MIL/uL (ref 4.22–5.81)
RDW: 13.3 % (ref 11.5–15.5)
WBC: 6.5 10*3/uL (ref 4.0–10.5)
nRBC: 0 % (ref 0.0–0.2)

## 2023-09-24 LAB — URINE DRUG SCREEN
Amphetamines: DETECTED — AB
Barbiturates: NOT DETECTED
Benzodiazepines: NOT DETECTED
Cocaine: DETECTED — AB
Fentanyl: NOT DETECTED
Methadone Scn, Ur: NOT DETECTED
Opiates: NOT DETECTED
Tetrahydrocannabinol: DETECTED — AB

## 2023-09-24 LAB — HIV ANTIBODY (ROUTINE TESTING W REFLEX): HIV Screen 4th Generation wRfx: NONREACTIVE

## 2023-09-24 LAB — ETHANOL: Alcohol, Ethyl (B): <15 mg/dL (ref <15)

## 2023-09-24 LAB — CK: Total CK: 264 U/L (ref 49–397)

## 2023-09-24 MED ORDER — PREDNISONE 10 MG PO TABS: 40.0000 mg | ORAL_TABLET | Freq: Every day | ORAL | 0 refills | Status: DC

## 2023-09-24 MED ORDER — PREDNISONE 20 MG PO TABS
40.0000 mg | ORAL_TABLET | Freq: Once | ORAL | Status: AC
  Administered 2023-09-24: 40 mg via ORAL
  Filled 2023-09-24: qty 2

## 2023-09-24 MED ORDER — PREDNISONE 50 MG PO TABS: 60.0000 mg | ORAL_TABLET | Freq: Once | ORAL | Status: DC

## 2023-09-24 MED ORDER — SODIUM CHLORIDE 0.9 % IV BOLUS
1000.0000 mL | Freq: Once | INTRAVENOUS | Status: AC
  Administered 2023-09-24: 1000 mL via INTRAVENOUS

## 2023-09-24 MED ORDER — TETANUS-DIPHTH-ACELL PERTUSSIS 5-2.5-18.5 LF-MCG/0.5 IM SUSY
0.5000 mL | PREFILLED_SYRINGE | Freq: Once | INTRAMUSCULAR | Status: AC
  Administered 2023-09-24: 0.5 mL via INTRAMUSCULAR
  Filled 2023-09-24: qty 0.5

## 2023-09-24 MED ORDER — ONDANSETRON HCL 4 MG/2ML IJ SOLN
INTRAMUSCULAR | Status: AC
  Administered 2023-09-24: 4 mg
  Filled 2023-09-24: qty 2
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
## 2023-09-24 NOTE — ED Notes (Signed)
 FALL RISK BUNDLE IS CURRENTLY IN PLACE

## 2023-09-24 NOTE — Discharge Instructions (Signed)
 Take the prednisone  as directed.  Would apply antibiotic ointment to the wound on your left forehead.  Prevent let that heal by secondary intention which means on its own.  Head CT CT neck without any acute findings.

## 2023-09-24 NOTE — ED Notes (Signed)
Pt ask for something to drink, provided.

## 2023-09-24 NOTE — ED Provider Notes (Signed)
 Received signout from Dr. Zackowski.  See their note for full HPI.  Signed out pending CK level.  CK level resulted normal.  Patient alert and oriented x 3.  No specific complaint.  He has been hydrated with IVFs  Family members at bedside to take him home.  Will discharge as planned counseling.   Neysa Caron PARAS, DO 09/24/23 1628

## 2023-09-24 NOTE — ED Notes (Signed)
 Asked pt to provide a urine sample, provided a urinal
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
## 2023-09-25 LAB — RPR: RPR Ser Ql: NONREACTIVE

## 2023-12-20 ENCOUNTER — Other Ambulatory Visit: Payer: Self-pay

## 2023-12-20 ENCOUNTER — Emergency Department (HOSPITAL_COMMUNITY)

## 2023-12-20 ENCOUNTER — Encounter (HOSPITAL_COMMUNITY): Payer: Self-pay

## 2023-12-20 ENCOUNTER — Emergency Department (HOSPITAL_COMMUNITY)
Admission: EM | Admit: 2023-12-20 | Discharge: 2023-12-20 | Disposition: A | Discharge status: Home / Self Care | Attending: Emergency Medicine | Admitting: Emergency Medicine

## 2023-12-20 DIAGNOSIS — R0602 Shortness of breath: Secondary | ICD-10-CM | POA: Diagnosis not present

## 2023-12-20 DIAGNOSIS — R0600 Dyspnea, unspecified: Secondary | ICD-10-CM | POA: Insufficient documentation

## 2023-12-20 DIAGNOSIS — R112 Nausea with vomiting, unspecified: Secondary | ICD-10-CM | POA: Diagnosis not present

## 2023-12-20 DIAGNOSIS — R079 Chest pain, unspecified: Secondary | ICD-10-CM | POA: Insufficient documentation

## 2023-12-20 DIAGNOSIS — R0789 Other chest pain: Secondary | ICD-10-CM | POA: Diagnosis not present

## 2023-12-20 LAB — CBC WITH DIFFERENTIAL/PLATELET
Abs Immature Granulocytes: 0.01 K/uL (ref 0.00–0.07)
Basophils Absolute: 0 K/uL (ref 0.0–0.1)
Basophils Relative: 1 %
Eosinophils Absolute: 0 K/uL (ref 0.0–0.5)
Eosinophils Relative: 0 %
HCT: 42.9 % (ref 39.0–52.0)
Hemoglobin: 14.1 g/dL (ref 13.0–17.0)
Immature Granulocytes: 0 %
Lymphocytes Relative: 23 %
Lymphs Abs: 1.4 K/uL (ref 0.7–4.0)
MCH: 30.2 pg (ref 26.0–34.0)
MCHC: 32.9 g/dL (ref 30.0–36.0)
MCV: 91.9 fL (ref 80.0–100.0)
Monocytes Absolute: 0.4 K/uL (ref 0.1–1.0)
Monocytes Relative: 6 %
Neutro Abs: 4.3 K/uL (ref 1.7–7.7)
Neutrophils Relative %: 70 %
Platelets: 285 K/uL (ref 150–400)
RBC: 4.67 MIL/uL (ref 4.22–5.81)
RDW: 13.3 % (ref 11.5–15.5)
WBC: 6.1 K/uL (ref 4.0–10.5)
nRBC: 0 % (ref 0.0–0.2)

## 2023-12-20 LAB — BASIC METABOLIC PANEL WITH GFR
Anion gap: 13 (ref 5–15)
BUN: 16 mg/dL (ref 6–20)
CO2: 24 mmol/L (ref 22–32)
Calcium: 10.1 mg/dL (ref 8.9–10.3)
Chloride: 101 mmol/L (ref 98–111)
Creatinine, Ser: 1.08 mg/dL (ref 0.61–1.24)
GFR, Estimated: >60 mL/min (ref >60)
Glucose, Bld: 115 mg/dL — ABNORMAL HIGH (ref 70–99)
Potassium: 4.2 mmol/L (ref 3.5–5.1)
Sodium: 138 mmol/L (ref 135–145)

## 2023-12-20 LAB — TROPONIN T, HIGH SENSITIVITY: Troponin T High Sensitivity: <15 ng/L (ref 0–19)

## 2023-12-20 MED ORDER — ONDANSETRON 8 MG PO TBDP: 8.0000 mg | ORAL_TABLET | Freq: Three times a day (TID) | ORAL | 0 refills | Status: DC | PRN

## 2023-12-20 MED ORDER — PROMETHAZINE HCL 25 MG RE SUPP: 25.0000 mg | Freq: Four times a day (QID) | RECTAL | 0 refills | Status: DC | PRN

## 2023-12-20 MED ORDER — LORAZEPAM 2 MG/ML IJ SOLN
1.0000 mg | Freq: Once | INTRAMUSCULAR | Status: DC
  Filled 2023-12-20: qty 1

## 2023-12-20 MED ORDER — ONDANSETRON 4 MG PO TBDP
4.0000 mg | ORAL_TABLET | Freq: Once | ORAL | Status: AC
  Administered 2023-12-20: 4 mg via ORAL
  Filled 2023-12-20: qty 1
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
## 2023-12-20 NOTE — ED Provider Notes (Signed)
EMERGENCY DEPARTMENT AT [facility] Provider Note   CSN: [id number] Arrival date & time: 12/20/23  1138     Patient presents with: Emesis and Addiction Problem   Cody Rios is a 33 y.o. male.   HPI     33 year old male comes in with chief complaint of vomiting. Collateral history provided by patient's boyfriend who is at the bedside.  Patient was brought here by EMS because he was vomiting.  According to the boyfriend, patient started feeling sick around 9 AM.  Patient has smoked meth earlier today.  They were going to [employer] station, when patient started having burning type pain, he felt that he was short of breath and that he started vomiting violently.  Patient has previous history of endocarditis.  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  Patient denies any recurrent chest pain, fevers, chills  Boyfriend later on patient's stay indicates that patient has complained of shortness of breath off and on recently.   Prior to Admission medications   Medication Sig Start Date End Date Taking? Authorizing Provider  ondansetron  (ZOFRAN -ODT) 8 MG disintegrating tablet Take 1 tablet (8 mg total) by mouth every 8 (eight) hours as needed for nausea. 12/20/23  Yes Charlyn Sora, MD  promethazine  (PHENERGAN ) 25 MG suppository Place 1 suppository (25 mg total) rectally every 6 (six) hours as needed for nausea or vomiting. 12/20/23  Yes Charlyn Sora, MD    Allergies: Cephalosporins and Penicillins    Review of Systems  All other systems reviewed and are negative.   Updated Vital Signs BP 132/74   Pulse 74   Temp 97.8 F (36.6 C) (Oral)   Resp 16   Ht 5' 11 (1.803 m)   Wt 59 kg   SpO2 100%   BMI 18.13 kg/m   Physical Exam Vitals and nursing note reviewed.  Constitutional:      General: He is not in acute distress.    Appearance: He is well-developed. He is not toxic-appearing or diaphoretic.  HENT:     Head: Atraumatic.   Cardiovascular:     Rate and Rhythm: Normal rate.     Heart sounds: No murmur heard. Pulmonary:     Effort: Pulmonary effort is normal.  Musculoskeletal:     Cervical back: Neck supple.  Skin:    General: Skin is warm.     Findings: No rash.     Comments: Negative for any Osler nodes or Janeway lesions  Neurological:     Mental Status: He is alert and oriented to person, place, and time.     (all labs ordered are listed, but only abnormal results are displayed) Labs Reviewed  BASIC METABOLIC PANEL WITH GFR - Abnormal; Notable for the following components:      Result Value   Glucose, Bld 115 (*)    All other components within normal limits  CBC WITH DIFFERENTIAL/PLATELET  TROPONIN T, HIGH SENSITIVITY  TROPONIN T, HIGH SENSITIVITY    EKG: None  Radiology: [facility] Chest Port 1 View Result Date: 12/20/2023 CLINICAL DATA:  cp EXAM: PORTABLE CHEST - 1 VIEW COMPARISON:  March 25, 24 FINDINGS: No focal airspace consolidation, pleural effusion, or pneumothorax. No cardiomegaly.No acute fracture or destructive lesion. IMPRESSION: No acute cardiopulmonary abnormality. Electronically Signed   By: Rogelia Myers M.D.   On: 12/20/2023 16:22     Procedures   Medications Ordered in the ED  LORazepam  (ATIVAN ) injection 1 mg (has no administration in time range)  ondansetron  (ZOFRAN -ODT) disintegrating tablet 4 mg (4 mg Oral Given 12/20/23 1629)                                    Medical Decision Making Amount and/or Complexity of Data Reviewed Labs: ordered. Radiology: ordered.  Risk Prescription drug management.   Patient comes in with chief complaint of nausea, vomiting, chest pain.  Patient has history of meth use, currently smokes it.  He has history of endocarditis several years ago that was treated with vancomycin.  Main reason for the ER visit today's acute chest pain, shortness of breath, nausea and vomiting that started around 9 AM.  EKG shows sinus rhythm at  61. Patient currently has burning type chest pain.  But the chest pain is located on the left side. I do not appreciate any murmurs.  Family is concerned about intermittent episodes of shortness of breath that patient has complained about.  They are worried about the endocarditis that he had had in the past and were wondering if he can get an echocardiogram.  Differential considered for this patient includes acute coronary syndrome, aortic dissection, gastritis, Mallory-Weiss tear, esophagitis, pneumothorax, severe electrolyte abnormality, dehydration, meth toxicity.  Endocarditis is low on the differential.  No fevers here.  No tachycardia or hypotension noted.  Plan is to focus on symptom management right now.  Will get basic labs, give patient some Zofran .  IV Ativan  was ordered, but patient declined.  Reassessment: I reassessed the patient at 7:30 PM. Patient is a lot comfortable now.  He has been sleeping.  Blood work is normal.  Troponin is undetectable.  Single troponin should be adequate in this situation as patient's pain has been present for more than 6 hours prior to my assessment and troponin draw.  Patient is no longer vomiting.  Vital signs remained stable.  Labs are normal.  Results of the ER workup discussed with the patient and the boyfriend.  They are still wondering if patient should get echocardiogram.  I will put in cardiology referral given that he has been having some shortness of breath off and on.  My cardiac exam not reveal any significant murmur, but I think a follow-up with cardiology is not unreasonable.  The patient appears reasonably screened and/or stabilized for discharge and I doubt any other medical condition or other [facility] requiring further screening, evaluation, or treatment in the ED at this time prior to discharge.   Results from the ER workup discussed with the patient face to face and all questions answered to the best of my ability. The patient is safe for  discharge with strict return precautions.    Final diagnoses:  Chest pain, unspecified type  Dyspnea, unspecified type  Nausea and vomiting, unspecified vomiting type    ED Discharge Orders          Ordered    Ambulatory referral to Cardiology       Comments: If you have not heard from the Cardiology office within the next 72 hours please call [phone].   12/20/23 2014    ondansetron  (ZOFRAN -ODT) 8 MG disintegrating tablet  Every 8 hours PRN        12/20/23 2014    promethazine  (PHENERGAN ) 25 MG suppository  Every 6 hours PRN        12/20/23 2014               Charlyn Sora, MD 12/20/23  2055  

## 2023-12-20 NOTE — Discharge Instructions (Signed)
 We saw you in the ER for the chest pain/shortness of breath/nausea and vomiting. All of our cardiac workup is normal, including labs, EKG. heart enzymes are normal.  We are not sure what is causing your discomfort, but we feel comfortable sending you home at this time. The workup in the ER is not complete, and so we have put in a referral for cardiology to address any additional concerns.  Please return to the ER if you have worsening chest pain, shortness of breath, pain radiating to your jaw, shoulder, or back, sweats or fainting. Otherwise see the Cardiologist or your primary care doctor as requested.

## 2023-12-21 ENCOUNTER — Encounter (HOSPITAL_BASED_OUTPATIENT_CLINIC_OR_DEPARTMENT_OTHER): Payer: Self-pay

## 2024-02-15 ENCOUNTER — Ambulatory Visit

## 2024-02-15 NOTE — Progress Notes (Deleted)
    Cardiology Office Note Date:  02/15/2024  ID:  Cody Rios, DOB 1990-06-27, MRN [id number] PCP:  Pcp, No  Cardiologist: Joelle VEAR Ren Donley, MD  No chief complaint on file.     Problems CP w/ dyspnea in setting of meth use; worsening intermittent dyspnea at baseline TTE 2/23: 60-65%, severe TR --> recommended TEE but not performed due to AMA Prior history of IE (TV) treated with IV vancomycin  (11/2016) Meth use (smoking) HCV s/p Tx  Visits  11/25:     History of Present Illness: Cody Rios is a 33 y.o. male who presents for new visit.   ROS: Please see the history of present illness. All other systems are reviewed and negative.   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   Hepatitis C infection 11/22/2016   Treated 2019   Hepatitis C test positive    Hypertension    MSSA bacteremia 11/10/2016   Opioid use disorder, severe, on maintenance therapy (HCC) 11/22/2016   Severe tricuspid regurgitation 11/10/2016    Past Surgical History:  Procedure Laterality Date   HERNIA REPAIR     KNEE SURGERY     TEE WITHOUT CARDIOVERSION N/A 11/14/2016   Procedure: TRANSESOPHAGEAL ECHOCARDIOGRAM (TEE);  Surgeon: [name], MD;  Location: [facility] ENDOSCOPY;  Service: Cardiovascular;  Laterality: N/A;    Current Outpatient Medications  Medication Sig Dispense Refill   buprenorphine -naloxone  (SUBOXONE ) 8-2 mg SUBL SL tablet Place 1/2 tablet under the tongue 2 (two) times daily. 7 tablet 0   clotrimazole  (LOTRIMIN ) 1 % cream Apply to feet morning and evening x 2 weeks 15 g 2   hydrOXYzine  (ATARAX ) 25 MG tablet Take 1 tablet (25 mg total) by mouth every 6 (six) hours. 12 tablet 0   ondansetron  (ZOFRAN -ODT) 8 MG disintegrating tablet Take 1 tablet (8 mg total) by mouth every 8 (eight) hours as needed for nausea. 20 tablet 0   predniSONE  (DELTASONE ) 10 MG tablet Take 4 tablets (40 mg  total) by mouth daily. 20 tablet 0   promethazine (PHENERGAN) 25 MG suppository Place 1 suppository (25 mg total) rectally every 6 (six) hours as needed for nausea or vomiting. 12 each 0   No current facility-administered medications for this visit.    Allergies:   Cephalosporins, Cipro  [ciprofloxacin  hcl], Penicillins, Cephalosporins, Cephalosporins, Ciprofloxacin , Penicillins, and Penicillins   Social History:  see above  Family History:  see above  PHYSICAL EXAM: VS:  There were no vitals taken for this visit. , BMI There is no height or weight on file to calculate BMI. GEN: Well nourished, well developed, in no acute distress HEENT: normal Neck: no JVD, carotid bruits, or masses Cardiac: ***RRR; no murmurs, rubs, or gallops,no edema  Respiratory:  CTAB bilaterally, normal work of breathing GI: soft, nontender, nondistended, + BS Extremities: No LE edema Skin: warm and dry, no rash Neuro:  Strength and sensation are intact  EKG: ***  Recent Labs: Reviewed  Studies: Reviewed  ASSESSMENT AND PLAN: Cody Rios is a 33 y.o. male who presents for new visit.  - *** - *** - *** - ***   Signed, Joelle VEAR Ren Donley, MD  02/15/2024 7:50 AM    [facility]

## 2024-02-15 NOTE — Progress Notes (Unsigned)
 CARDIOLOGY CONSULT NOTE       Patient ID: Cody Rios MRN: [id number] DOB/AGE: [birth year] 33 y.o.  Referring Physician: Charlyn ER Primary Physician: Pcp, No Primary Cardiologist: New Reason for Consultation: chest pain    HPI:  33 y.o. seen at request of [place] Dr Charlyn for chest pain. 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 Seen in ED 12/20/23 after smoking meth and had burning pain in chest with dyspnea and vomiting. ECG non acute troponin negative Rx with Zofran  and phenergan He takes suboxone  chronically Quit smoking in 03/12/2017  TV endocarditis was in 2017-03-12 TEE done by Dr Alveta showed normal EF 65-70% with vegetations on TV with mod-severe TR Rx with prolonged Vancomycin . F/U echo done 05/09/21 showed normal RV/LV function with severe TR  Dyspnea has been worse last few months. CXR in ER 12/20/23 NAD  Unfortunately his is homeless. Down hill since mom died in 03/12/18. No longer taking suboxone  and smoking meth. No iv use for 11 months. Father and siblings in town but estranged. Has a friend he can go to to shower. 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   Hepatitis C infection 11/22/2016   Treated Mar 12, 2018   Hepatitis C test positive    Hypertension    MSSA bacteremia 11/10/2016   Opioid use disorder, severe, on maintenance therapy (HCC) 11/22/2016   Severe tricuspid regurgitation 11/10/2016    Family History  Problem Relation Age of Onset   Hypertension Father     Social History   Socioeconomic History   Marital status: Single    Spouse name: Not on file   Number of children: Not on file   Years of education: Not on file   Highest education level: Not on file  Occupational History   Not on file  Tobacco Use    Smoking status: Never   Smokeless tobacco: Former    Types: Chew    Quit date: 03/05/2019  Vaping Use   Vaping status: Never Used  Substance and Sexual Activity   Alcohol use: No   Drug use: Not Currently    Types: IV, Heroin    Comment: Last used: 11/09/16   Sexual activity: Not on file  Other Topics Concern   Not on file  Social History Narrative   ** Merged History Encounter **       ** Merged History Encounter **       Social Drivers of Corporate Investment Banker Strain: Not on file  Food Insecurity: Not on file  Transportation Needs: Not on file  Physical Activity: Not on file  Stress: Not on file  Social Connections: Unknown (08/04/2021)   Received from [facility]   Social Network    Social Network: Not on file  Intimate Partner Violence: Unknown (07/05/2021)   Received from [facility]   HITS    Physically Hurt: Not on file    Insult or Talk Down To: Not on file    Threaten Physical Harm: Not on file    Scream or Curse: Not on file    Past Surgical History:  Procedure Laterality Date   HERNIA REPAIR     KNEE SURGERY     TEE WITHOUT CARDIOVERSION N/A 11/14/2016  Procedure: TRANSESOPHAGEAL ECHOCARDIOGRAM (TEE);  Surgeon: [name], MD;  Location: [facility] ENDOSCOPY;  Service: Cardiovascular;  Laterality: N/A;      Current Outpatient Medications:    buprenorphine -naloxone  (SUBOXONE ) 8-2 mg SUBL SL tablet, Place 1/2 tablet under the tongue 2 (two) times daily. (Patient not taking: Reported on 02/16/2024), Disp: 7 tablet, Rfl: 0   clotrimazole  (LOTRIMIN ) 1 % cream, Apply to feet morning and evening x 2 weeks (Patient not taking: Reported on 02/16/2024), Disp: 15 g, Rfl: 2   hydrOXYzine  (ATARAX ) 25 MG tablet, Take 1 tablet (25 mg total) by mouth every 6 (six) hours. (Patient not taking: Reported on 02/16/2024), Disp: 12 tablet, Rfl: 0   ondansetron  (ZOFRAN -ODT) 8 MG disintegrating tablet, Take 1 tablet (8 mg total) by mouth every 8 (eight) hours as needed for  nausea. (Patient not taking: Reported on 02/16/2024), Disp: 20 tablet, Rfl: 0   predniSONE  (DELTASONE ) 10 MG tablet, Take 4 tablets (40 mg total) by mouth daily. (Patient not taking: Reported on 02/16/2024), Disp: 20 tablet, Rfl: 0   promethazine (PHENERGAN) 25 MG suppository, Place 1 suppository (25 mg total) rectally every 6 (six) hours as needed for nausea or vomiting. (Patient not taking: Reported on 02/16/2024), Disp: 12 each, Rfl: 0    Physical Exam: Blood pressure (!) 142/96, pulse 95, height 6' 2 (1.88 m), weight 170 lb 6.4 oz (77.3 kg), SpO2 95%.    Affect appropriate Healthy:  appears stated age HEENT: normal Neck supple with no adenopathy JVP normal no bruits no thyromegaly Lungs clear with no wheezing and good diaphragmatic motion Heart:  S1/S2 SEM RLSB Abdomen: benighn, BS positve, no tenderness, no AAA no bruit.  No HSM or HJR Distal pulses intact with no bruits No edema Neuro non-focal Skin warm and dry No muscular weakness   Labs:   Lab Results  Component Value Date   WBC 6.1 12/20/2023   HGB 14.1 12/20/2023   HCT 42.9 12/20/2023   MCV 91.9 12/20/2023   PLT 285 12/20/2023   No results for input(s): NA, K, CL, CO2, BUN, CREATININE, CALCIUM, PROT, BILITOT, ALKPHOS, ALT, AST, GLUCOSE in the last 168 hours.  Invalid input(s): LABALBU Lab Results  Component Value Date   CKTOTAL 264 09/24/2023   No results found for: CHOL No results found for: HDL No results found for: LDLCALC No results found for: TRIG No results found for: CHOLHDL No results found for: LDLDIRECT    Radiology: No results found.  EKG: SR rate 65 RAD otherwise normal    ASSESSMENT AND PLAN:   Dyspnea/Chest Pain:  in setting of meth use. R/O no acute ECG changes doubt CAD. More likely reflux GI pain. 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 Update TTE. His pain in chest is muscular. He is  homeless with limited resources no need for stress testing/labs  Substance Abuse:  he would not be a surgical candidate to repair/replace TV while still using. On suboxone  f/u primary HTN:  not on meds DASH diet   Echo  F/U in 6 months pending testing   Signed: Maude Emmer 02/16/2024, 3:47 PM

## 2024-02-16 ENCOUNTER — Ambulatory Visit: Attending: Cardiovascular Disease | Admitting: Cardiovascular Disease

## 2024-02-16 ENCOUNTER — Encounter: Payer: Self-pay | Admitting: Cardiovascular Disease

## 2024-02-16 VITALS — BP 142/96 | HR 95 | Ht 74.0 in | Wt 170.4 lb

## 2024-02-16 DIAGNOSIS — I38 Endocarditis, valve unspecified: Secondary | ICD-10-CM

## 2024-02-16 DIAGNOSIS — I071 Rheumatic tricuspid insufficiency: Secondary | ICD-10-CM | POA: Diagnosis not present

## 2024-02-16 DIAGNOSIS — F1120 Opioid dependence, uncomplicated: Secondary | ICD-10-CM

## 2024-02-16 DIAGNOSIS — R079 Chest pain, unspecified: Secondary | ICD-10-CM

## 2024-02-16 NOTE — Patient Instructions (Signed)
 Medication Instructions:  Your physician recommends that you continue on your current medications as directed. Please refer to the Current Medication list given to you today.  *If you need a refill on your cardiac medications before your next appointment, please call your pharmacy*  Lab Work: None ordered If you have labs (blood work) drawn today and your tests are completely normal, you will receive your results only by: MyChart Message (if you have MyChart) OR A paper copy in the mail If you have any lab test that is abnormal or we need to change your treatment, we will call you to review the results.  Testing/Procedures: Your physician has requested that you have an echocardiogram. Echocardiography is a painless test that uses sound waves to create images of your heart. It provides your doctor with information about the size and shape of your heart and how well your heart's chambers and valves are working. This procedure takes approximately one hour. There are no restrictions for this procedure. Please do NOT wear cologne, perfume, aftershave, or lotions (deodorant is allowed). Please arrive 15 minutes prior to your appointment time.  Please note: We ask at that you not bring children with you during ultrasound (echo/ vascular) testing. Due to room size and safety concerns, children are not allowed in the ultrasound rooms during exams. Our front office staff cannot provide observation of children in our lobby area while testing is being conducted. An adult accompanying a patient to their appointment will only be allowed in the ultrasound room at the discretion of the ultrasound technician under special circumstances. We apologize for any inconvenience.   Follow-Up: At [facility], you and your health needs are our priority.  As part of our continuing mission to provide you with exceptional heart care, our providers are all part of one team.  This team includes your primary  Cardiologist (physician) and Advanced Practice Providers or APPs (Physician Assistants and Nurse Practitioners) who all work together to provide you with the care you need, when you need it.  Your next appointment:   6 month(s)  Provider:   Dr Nishan

## 2024-04-01 ENCOUNTER — Ambulatory Visit (HOSPITAL_COMMUNITY)

## 2024-04-30 ENCOUNTER — Ambulatory Visit: Payer: Self-pay | Admitting: Cardiovascular Disease

## 2024-04-30 ENCOUNTER — Ambulatory Visit (HOSPITAL_COMMUNITY)
Admission: RE | Admit: 2024-04-30 | Discharge: 2024-04-30 | Disposition: A | Discharge status: Home / Self Care | Source: Ambulatory Visit

## 2024-04-30 DIAGNOSIS — I071 Rheumatic tricuspid insufficiency: Secondary | ICD-10-CM | POA: Diagnosis present

## 2024-04-30 DIAGNOSIS — I361 Nonrheumatic tricuspid (valve) insufficiency: Secondary | ICD-10-CM | POA: Diagnosis not present

## 2024-04-30 DIAGNOSIS — I38 Endocarditis, valve unspecified: Secondary | ICD-10-CM | POA: Insufficient documentation

## 2024-04-30 LAB — ECHOCARDIOGRAM COMPLETE: S' Lateral: 3.47 cm

## 2024-05-01 ENCOUNTER — Emergency Department (HOSPITAL_COMMUNITY)

## 2024-05-01 ENCOUNTER — Inpatient Hospital Stay (HOSPITAL_COMMUNITY)
Admission: EM | Admit: 2024-05-01 | Discharge: 2024-05-03 | DRG: 290 | Disposition: A | Discharge status: Home / Self Care | Attending: Infectious Diseases | Admitting: Infectious Diseases

## 2024-05-01 ENCOUNTER — Other Ambulatory Visit: Payer: Self-pay

## 2024-05-01 ENCOUNTER — Encounter (HOSPITAL_COMMUNITY): Payer: Self-pay

## 2024-05-01 DIAGNOSIS — B1920 Unspecified viral hepatitis C without hepatic coma: Secondary | ICD-10-CM | POA: Diagnosis not present

## 2024-05-01 DIAGNOSIS — F32A Depression, unspecified: Secondary | ICD-10-CM | POA: Diagnosis present

## 2024-05-01 DIAGNOSIS — I10 Essential (primary) hypertension: Secondary | ICD-10-CM | POA: Diagnosis not present

## 2024-05-01 DIAGNOSIS — Z5329 Procedure and treatment not carried out because of patient's decision for other reasons: Secondary | ICD-10-CM | POA: Diagnosis present

## 2024-05-01 DIAGNOSIS — Z86711 Personal history of pulmonary embolism: Secondary | ICD-10-CM

## 2024-05-01 DIAGNOSIS — F419 Anxiety disorder, unspecified: Secondary | ICD-10-CM | POA: Diagnosis present

## 2024-05-01 DIAGNOSIS — Z8619 Personal history of other infectious and parasitic diseases: Secondary | ICD-10-CM

## 2024-05-01 DIAGNOSIS — I071 Rheumatic tricuspid insufficiency: Secondary | ICD-10-CM | POA: Diagnosis present

## 2024-05-01 DIAGNOSIS — Z881 Allergy status to other antibiotic agents status: Secondary | ICD-10-CM

## 2024-05-01 DIAGNOSIS — F1510 Other stimulant abuse, uncomplicated: Secondary | ICD-10-CM | POA: Diagnosis not present

## 2024-05-01 DIAGNOSIS — I330 Acute and subacute infective endocarditis: Secondary | ICD-10-CM | POA: Diagnosis not present

## 2024-05-01 DIAGNOSIS — Z880 Allergy status to penicillin: Secondary | ICD-10-CM

## 2024-05-01 LAB — BASIC METABOLIC PANEL WITH GFR
Anion gap: 10 (ref 5–15)
BUN: 20 mg/dL (ref 6–20)
CO2: 25 mmol/L (ref 22–32)
Calcium: 9.4 mg/dL (ref 8.9–10.3)
Chloride: 104 mmol/L (ref 98–111)
Creatinine, Ser: 0.92 mg/dL (ref 0.61–1.24)
GFR, Estimated: >60 mL/min
Glucose, Bld: 129 mg/dL — ABNORMAL HIGH (ref 70–99)
Potassium: 4.3 mmol/L (ref 3.5–5.1)
Sodium: 139 mmol/L (ref 135–145)

## 2024-05-01 LAB — HEPATIC FUNCTION PANEL
ALT: 48 U/L — ABNORMAL HIGH (ref 0–44)
AST: 65 U/L — ABNORMAL HIGH (ref 15–41)
Albumin: 4.3 g/dL (ref 3.5–5.0)
Alkaline Phosphatase: 62 U/L (ref 38–126)
Bilirubin, Direct: <0.1 mg/dL (ref 0.0–0.2)
Total Bilirubin: 0.3 mg/dL (ref 0.0–1.2)
Total Protein: 6.6 g/dL (ref 6.5–8.1)

## 2024-05-01 LAB — CBC
HCT: 40.9 % (ref 39.0–52.0)
Hemoglobin: 13.9 g/dL (ref 13.0–17.0)
MCH: 31.5 pg (ref 26.0–34.0)
MCHC: 34 g/dL (ref 30.0–36.0)
MCV: 92.7 fL (ref 80.0–100.0)
Platelets: 306 10*3/uL (ref 150–400)
RBC: 4.41 MIL/uL (ref 4.22–5.81)
RDW: 13.1 % (ref 11.5–15.5)
WBC: 5.9 10*3/uL (ref 4.0–10.5)
nRBC: 0 % (ref 0.0–0.2)

## 2024-05-01 LAB — HEPATITIS B SURFACE ANTIGEN: Hepatitis B Surface Ag: NONREACTIVE

## 2024-05-01 LAB — PRO BRAIN NATRIURETIC PEPTIDE: Pro Brain Natriuretic Peptide: <50 pg/mL

## 2024-05-01 LAB — HEPATITIS A ANTIBODY, TOTAL: hep A Total Ab: REACTIVE — AB

## 2024-05-01 LAB — TROPONIN T, HIGH SENSITIVITY
Troponin T High Sensitivity: 17 ng/L (ref 0–19)
Troponin T High Sensitivity: 17 ng/L (ref 0–19)

## 2024-05-01 MED ORDER — VANCOMYCIN HCL 2000 MG/400ML IV SOLN
2000.0000 mg | Freq: Once | INTRAVENOUS | Status: AC
  Administered 2024-05-01: 2000 mg via INTRAVENOUS
  Filled 2024-05-01: qty 400

## 2024-05-01 MED ORDER — SODIUM CHLORIDE 0.9 % IV SOLN: 2.0000 g | Freq: Three times a day (TID) | INTRAVENOUS | Status: DC

## 2024-05-01 MED ORDER — ENOXAPARIN SODIUM 40 MG/0.4ML IJ SOSY
40.0000 mg | PREFILLED_SYRINGE | INTRAMUSCULAR | Status: DC
  Administered 2024-05-01 – 2024-05-02 (×2): 40 mg via SUBCUTANEOUS
  Filled 2024-05-01 (×3): qty 0.4

## 2024-05-01 MED ORDER — SODIUM CHLORIDE 0.9 % IV SOLN
2.0000 g | Freq: Three times a day (TID) | INTRAVENOUS | Status: DC
  Administered 2024-05-01 – 2024-05-03 (×7): 2 g via INTRAVENOUS
  Filled 2024-05-01 (×7): qty 12.5

## 2024-05-01 MED ORDER — SODIUM CHLORIDE 0.9 % IV SOLN
2.0000 g | Freq: Once | INTRAVENOUS | Status: AC
  Administered 2024-05-01: 2 g via INTRAVENOUS
  Filled 2024-05-01: qty 12.5

## 2024-05-01 MED ORDER — VANCOMYCIN HCL 1750 MG/350ML IV SOLN
1750.0000 mg | Freq: Two times a day (BID) | INTRAVENOUS | Status: DC
  Administered 2024-05-01 – 2024-05-03 (×4): 1750 mg via INTRAVENOUS
  Filled 2024-05-01 (×7): qty 350

## 2024-05-01 NOTE — ED Provider Notes (Signed)
 " [place] EMERGENCY DEPARTMENT AT [facility] Provider Note   CSN: [id number] Arrival date & time: 05/01/24  9377     Patient presents with: Chest Pain   Cody Rios is a 34 y.o. male.    Chest Pain     Patient presents because of shortness of breath as well as abnormal echo.  Patient states that he was told to come back to the ED for further evaluation after echo was completed which showed possible vegetation.  Patient endorses some generalized fatigue.  No obvious fever or chills.  Patient states that every once while he gets a slight chest discomfort but otherwise no exertional chest pain.  Patient states that he is having shortness of breath but has been kind of his baseline over the past couple years.  No obvious orthopnea.  Does endorse maybe some slight dyspnea.  No history of PE.  No pleuritic chest pain or hemoptysis.  No leg swelling or unilateral leg pain.  Patient states that he is not having count of recent fevers.  Last IV drug abuse was in February 2025.  Denies any swelling of arms or abscesses.   Previous medical history reviewed : Follow-up with cardiology in November 2025. 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 had echo completed yesterday.  Questionable mobile vegetation tricuspid valve.   Prior to Admission medications  Medication Sig Start Date End Date Taking? Authorizing Provider  DESCOVY 200-25 MG tablet Take 1 tablet by mouth daily. Patient not taking: Reported on 05/01/2024 04/24/24   [provider]  doxycycline  (VIBRA -TABS) 100 MG tablet Take 100 mg by mouth 2 (two) times daily. Patient not taking: Reported on 05/01/2024 04/30/24   [provider]    Allergies: Cephalosporins, Cipro  [ciprofloxacin  hcl], and Penicillins    Review of Systems  Cardiovascular:  Positive for chest pain.    Updated Vital Signs BP 135/79   Pulse 79   Temp 98.4 F  (36.9 C)   Resp 15   Ht 6' 2 (1.88 m)   Wt 81.6 kg   SpO2 98%   BMI 23.11 kg/m   Physical Exam Vitals and nursing note reviewed.  Constitutional:      General: He is not in acute distress.    Appearance: He is well-developed.  HENT:     Head: Normocephalic and atraumatic.  Eyes:     Conjunctiva/sclera: Conjunctivae normal.  Cardiovascular:     Rate and Rhythm: Normal rate and regular rhythm.     Heart sounds: No murmur heard. Pulmonary:     Effort: Pulmonary effort is normal. No respiratory distress.     Breath sounds: Normal breath sounds.  Abdominal:     Palpations: Abdomen is soft.     Tenderness: There is no abdominal tenderness.  Musculoskeletal:        General: No swelling.     Cervical back: Neck supple.  Skin:    General: Skin is warm and dry.     Capillary Refill: Capillary refill takes less than 2 seconds.  Neurological:     Mental Status: He is alert.  Psychiatric:        Mood and Affect: Mood normal.     (all labs ordered are listed, but only abnormal results are displayed) Labs Reviewed  BASIC METABOLIC PANEL WITH GFR - Abnormal; Notable for the following components:      Result Value   Glucose, Bld 129 (*)  All other components within normal limits  HEPATIC FUNCTION PANEL - Abnormal; Notable for the following components:   AST 65 (*)    ALT 48 (*)    All other components within normal limits  CULTURE, BLOOD (ROUTINE X 2)  CULTURE, BLOOD (ROUTINE X 2)  CULTURE, BLOOD (ROUTINE X 2)  CULTURE, BLOOD (ROUTINE X 2)  CBC  PRO BRAIN NATRIURETIC PEPTIDE  URINE DRUG SCREEN  URINALYSIS, ROUTINE W REFLEX MICROSCOPIC  HCV RNA QUANT RFLX ULTRA OR GENOTYP  HEPATITIS B SURFACE ANTIBODY, QUANTITATIVE  HEPATITIS B SURFACE ANTIGEN  HEPATITIS A ANTIBODY, TOTAL  TROPONIN T, HIGH SENSITIVITY  TROPONIN T, HIGH SENSITIVITY    EKG: EKG Interpretation Date/Time:  Wednesday May 01 2024 06:45:50 EST Ventricular Rate:  96 PR Interval:  136 QRS  Duration:  98 QT Interval:  356 QTC Calculation: 449 R Axis:   89  Text Interpretation: Normal sinus rhythm Right atrial enlargement Incomplete right bundle branch block Minimal voltage criteria for LVH, may be normal variant ( Sokolow-Lyon ) Nonspecific ST abnormality Abnormal ECG When compared with ECG of 16-Feb-2024 15:38, PREVIOUS ECG IS PRESENT Confirmed by Simon Rea [phone]) on 05/01/2024 7:18:00 AM  Radiology: [facility] Chest 2 View Result Date: 05/01/2024 EXAM: 2 VIEW(S) XRAY OF THE CHEST 05/01/2024 06:58:00 AM COMPARISON: 12/20/2023 CLINICAL HISTORY: Chest pain. FINDINGS: LUNGS AND PLEURA: No focal pulmonary opacity. No pleural effusion. No pneumothorax. HEART AND MEDIASTINUM: No acute abnormality of the cardiac and mediastinal silhouettes. BONES AND SOFT TISSUES: No acute osseous abnormality. IMPRESSION: 1. No acute cardiopulmonary pathology. Electronically signed by: Waddell Calk MD 05/01/2024 07:09 AM EST RP Workstation: [id number]   ECHOCARDIOGRAM COMPLETE Result Date: 04/30/2024    ECHOCARDIOGRAM REPORT   Patient Name:   Cody Rios Date of Exam: 04/30/2024 Medical Rec #:  [id number]           Height:       74.0 in Accession #:    [id number]          Weight:       170.4 lb Date of Birth:  January 03, 1991          BSA:          2.030 m Patient Age:    33 years            BP:           162/103 mmHg Patient Gender: M                   HR:           99 bpm. Exam Location:  [address] Procedure: 2D Echo, 3D Echo, Cardiac Doppler, Color Doppler and Strain Analysis            (Both Spectral and Color Flow Doppler were utilized during            procedure). STAT ECHO Indications:    I38 Endocarditis  History:        Patient has prior history of Echocardiogram examinations, most                 recent 05/10/2021. Endocarditis; Risk Factors:Hypertension and                 Opioid use.  Sonographer:    Powell Saras Referring Phys: [id number] PETER C NISHAN IMPRESSIONS  1. Left ventricular ejection  fraction, by estimation, is 60 to 65%. The left ventricle has normal function. The left ventricle has no regional wall motion abnormalities. Left  ventricular diastolic parameters were normal. The average left ventricular global longitudinal strain is -18.9 %. The global longitudinal strain is normal.  2. Right ventricular systolic function is normal. The right ventricular size is normal.  3. The mitral valve is normal in structure. No evidence of mitral valve regurgitation. No evidence of mitral stenosis.  4. Mobile vegetation ? on lateral leaflet no 3D imaging performed to localize. Tricuspid valve regurgitation is severe.  5. The aortic valve is normal in structure. Aortic valve regurgitation is not visualized. No aortic stenosis is present.  6. Aortic dilatation noted. There is mild dilatation of the aortic root, measuring 38 mm.  7. The inferior vena cava is dilated in size with >50% respiratory variability, suggesting right atrial pressure of 8 mmHg. FINDINGS  Left Ventricle: Left ventricular ejection fraction, by estimation, is 60 to 65%. The left ventricle has normal function. The left ventricle has no regional wall motion abnormalities. The average left ventricular global longitudinal strain is -18.9 %. Strain was performed and the global longitudinal strain is normal. The left ventricular internal cavity size was normal in size. There is no left ventricular hypertrophy. Left ventricular diastolic parameters were normal. Right Ventricle: The right ventricular size is normal. No increase in right ventricular wall thickness. Right ventricular systolic function is normal. Left Atrium: Left atrial size was normal in size. Right Atrium: Right atrial size was normal in size. Pericardium: There is no evidence of pericardial effusion. Mitral Valve: The mitral valve is normal in structure. No evidence of mitral valve regurgitation. No evidence of mitral valve stenosis. Tricuspid Valve: Mobile vegetation ? on lateral  leaflet no 3D imaging performed to localize. The tricuspid valve is normal in structure. Tricuspid valve regurgitation is severe. No evidence of tricuspid stenosis. Aortic Valve: The aortic valve is normal in structure. Aortic valve regurgitation is not visualized. No aortic stenosis is present. Pulmonic Valve: The pulmonic valve was normal in structure. Pulmonic valve regurgitation is not visualized. No evidence of pulmonic stenosis. Aorta: Aortic dilatation noted. There is mild dilatation of the aortic root, measuring 38 mm. Venous: The inferior vena cava is dilated in size with greater than 50% respiratory variability, suggesting right atrial pressure of 8 mmHg. IAS/Shunts: No atrial level shunt detected by color flow Doppler. Additional Comments: 3D was performed not requiring image post processing on an independent workstation and was normal.  LEFT VENTRICLE PLAX 2D LVIDd:         4.98 cm   Diastology LVIDs:         3.47 cm   LV e' medial:  12.30 cm/s LV PW:         1.05 cm   LV e' lateral: 16.00 cm/s LV IVS:        0.97 cm LVOT diam:     2.30 cm   2D Longitudinal Strain LV SV:         80        2D Strain GLS (A4C):   -14.4 % LV SV Index:   39        2D Strain GLS (A3C):   -20.2 % LVOT Area:     4.15 cm  2D Strain GLS (A2C):   -22.2 %                          2D Strain GLS Avg:     -18.9 %  3D Volume EF:                          3D EF:        56 %                          LV EDV:       209 ml                          LV ESV:       92 ml                          LV SV:        117 ml RIGHT VENTRICLE             IVC RV Basal diam:  5.43 cm     IVC diam: 1.93 cm RV Mid diam:    4.64 cm RV S prime:     20.70 cm/s TAPSE (M-mode): 3.3 cm LEFT ATRIUM             Index        RIGHT ATRIUM           Index LA diam:        3.90 cm 1.92 cm/m   RA Area:     26.20 cm LA Vol (A2C):   67.8 ml 33.39 ml/m  RA Volume:   89.50 ml  44.08 ml/m LA Vol (A4C):   55.1 ml 27.14 ml/m LA Biplane Vol: 61.7 ml  30.39 ml/m  AORTIC VALVE LVOT Vmax:   124.00 cm/s LVOT Vmean:  76.400 cm/s LVOT VTI:    0.193 m  AORTA Ao Root diam: 3.80 cm Ao Asc diam:  3.60 cm TRICUSPID VALVE TR Peak grad:   37.0 mmHg TR Vmax:        304.00 cm/s  SHUNTS Systemic VTI:  0.19 m Systemic Diam: 2.30 cm Maude Emmer MD Electronically signed by Maude Emmer MD Signature Date/Time: 04/30/2024/4:14:31 PM    Final      Procedures   Medications Ordered in the ED  enoxaparin  (LOVENOX ) injection 40 mg (has no administration in time range)  ceFEPIme  (MAXIPIME ) 2 g in sodium chloride  0.9 % 100 mL IVPB (has no administration in time range)  vancomycin  (VANCOREADY) IVPB 1750 mg/350 mL (has no administration in time range)  vancomycin  (VANCOREADY) IVPB 2000 mg/400 mL (0 mg Intravenous Stopped 05/01/24 1100)  ceFEPIme  (MAXIPIME ) 2 g in sodium chloride  0.9 % 100 mL IVPB (0 g Intravenous Stopped 05/01/24 0835)                                    Medical Decision Making Amount and/or Complexity of Data Reviewed Labs: ordered. Radiology: ordered.  Risk Prescription drug management. Decision regarding hospitalization.     HPI:   Patient presents because of shortness of breath as well as abnormal echo.  Patient states that he was told to come back to the ED for further evaluation after echo was completed which showed possible vegetation.  Patient endorses some generalized fatigue.  No obvious fever or chills.  Patient states that every once while he gets a slight chest discomfort but otherwise no exertional chest pain.  Patient states that he is having shortness of breath  but has been kind of his baseline over the past couple years.  No obvious orthopnea.  Does endorse maybe some slight dyspnea.  No history of PE.  No pleuritic chest pain or hemoptysis.  No leg swelling or unilateral leg pain.  Patient states that he is not having count of recent fevers.  Last IV drug abuse was in February 2025.  Denies any swelling of arms or  abscesses.  Previous medical history reviewed : Follow-up with cardiology in November 2025. 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 had echo completed yesterday.  Questionable mobile vegetation tricuspid valve.  MDM:   Upon examination, patient hemodynamically stable. A&O x 3 with GCS 15.   Afebrile.  Slightly hypertensive otherwise vital signs stable.  Reviewed echo from yesterday.  Concern for vegetations.  Will obtain 3 sets of blood cultures.  Start patient on vancomycin .  Appears that patient had a allergy to cephalosporins as a child but has tolerated cephalosporins as an adult.  Therefore, will start cefepime .  Last IV drug abuse was in February 2025.  Will send off a urine drug test as well.  No obvious abscesses from previous drug abuse.  No back pain.  No saddle anesthesia.  No concerns for spinal epidural abscess at this time.  Obtain cardiac enzymes.  BMP as well.  Basic laboratory workup.  Will need admission for IV antibiotics as further workup for his vegetations. Ordered 3 x blood cultures in the setting of workup for endocarditis.   Reevaluation:   Upon reexamination, patient hemodynamically stable.  Remains A&O x 3 with GCS 15.  Labs at this point, unremarkable.  Minimal elevation in terms of LFTs but patient has no pain in this area.  Troponin 17 will repeat but no concerns for ACS.  BMP unremarkable.  Chest x-ray unremarkable.  Medicine will admit the patient for further care.  Cardiology has been consulted for help. Interventions: vanc, cefepime    EKG Interpreted by Me: sinus    Cardiac Tele Interpreted by Me: sinus    I have independently interpreted the CXR images and agree with the radiologist finding   Social Determinant of Health: history of IV drug abuse        Final diagnoses:  Tricuspid valve vegetation    ED Discharge Orders     None          Simon Lavonia SAILOR,  MD 05/01/24 1618  "

## 2024-05-01 NOTE — Discharge Instructions (Addendum)
 Your Primary Care Provider information can be located on your Medicaid insurance card or by contacting Member Services at [phone].   Cody Rios,  You were recently admitted to [facility] for a concern for endocarditis, which is inflammation of the inside lining of your heart, as well as the heart valves. To treat this, you were given IV antibiotics. Based on the ultrasound of your heart (TEE), it is unclear if you fully have endocarditis. You were found to have a change on one of the valves of your heart, however more workup is needed to know how involved this change in your heart valve is, what exactly it is, and how large it is. The cardiology (heart doctor) team would be the doctors to have more clarifying answers, however, this was unable to be done due to your choice of leaving the hospital against medical advice.   Continue taking your home medications:  Continue taking Miralax , Senna for constipation. Continue taking doxycycline  for your chlamydia infection.    You should seek further medical care if you develop any fever over 101F, any chest pains or shortness of breath, or any changes in your mental status/new confusion, or loss of strength in your arms or legs.   We recommend that you see a primary care doctor in about a week to make sure that you continue to improve or return to the Emergency Room if you begin to develop any of the above symptoms. For now, please follow up with the health department until you are able to see a primary doctor.    Sincerely, Karlene Southard, DO

## 2024-05-01 NOTE — Hospital Course (Addendum)
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 Outpatient echo 04/30/2024 demonstrated a mobile vegetation on the lateral leaflet of tricuspid valve associated with severe tricuspid regurgitation and a dilated IVC, indicating significant valvular dysfunction and elevated right-sided filling pressures. High risk for septic pulmonary emboli given mobility of vegetation.  - continuous cardiac monitoring - not a surgical candidate due to current drug use, will clarify if this was based on IV use or current smoking as well - Blood cultures x2, UA, and urine drug screen pending - started on empiric vancomycin  and cefepime  - Order TEE with 3D reconstruction for definitive localization, measurement of vegetation size, and assessment of perivalvular extension   #Substance use disorder (meth; prior THC, cocaine, heroin) #History of IVDU (meth, heroin)  Current everyday meth smoker. Last IV heroin/meth use February 2025. Off suboxone  for 8 months, denies heroin cravings. At risk for withdrawal/agitation.  - Monitor for signs of withdrawal (fatigue, depression, agitation, psychosis - PRN anxiolytics/benzos   #Hepatitis C Known history with mild transaminitis. Last Hep C RNA undetectable in 2023. - Trend LFTs - recheck hepatitis panel   #Hypertension Blood pressure elevated on admission. Not on any home medications - monitor inpatient Bps   #Anxiety/depression Denies current symptoms of anxiety/depression. Not on any home medications  1/19 Patient assessed beside, awoken by team up from sleep. In no acute distress, appeared to be resting comfortably. Stated that he feels fine this AM. Denies any Agitation, aggravation, anxiety which are his general withdrawal symptoms from previous heroin withdrawal.  Additionally denies hallucinations. Having some itching from antibiotics (documented allergy to cephalosporin), but otherwise feeling okay.   1/30 Patient assessed bedside laying comfortably in bed. Stated that he has been itchy along his head and arms but it has been manageable and he doesn't need any medications for this. He stated that he has been feeling a bit anxious but denies any additional withdrawal type symptoms. Denies current abdominal pain. Had a bowel movement before he came into the hospital. Overall stated he slept okay and is interested in the results of his imaging and labs.

## 2024-05-01 NOTE — ED Triage Notes (Signed)
 Pt coming in reporting that yesterday and they said to come back to the hospital immediately due to an infection in one of the valves in his heart. Pt reports chest pain that is transient and shortness of breath.

## 2024-05-01 NOTE — H&P (Signed)
 " Date: 05/01/2024               Patient Name:  Cody Rios MRN: [id number]  DOB: 07-22-1990 Age / Sex: 34 y.o., male   PCP: Pcp, No         Medical Service: Internal Medicine Teaching Service         Attending Physician: [name], MD      First Contact: [name], MD    Second Contact: Dr. Lonni Africa, DO         After Hours (After 5p/  First Contact Pager: [phone]  weekends / holidays): Second Contact Pager: [phone]   SUBJECTIVE   Chief Complaint: Treatment of endocarditis  History of Present Illness: Cody Rios is a 34 y/o male with a PMH of anxiety/depression, substance use (smokes meth), hepatitis C, HTN, MSSA bacteremia w/ endocarditis (2018) who presents to the ED after getting an outpatient echo yesterday concerning for endocarditis. Imaging showed a mobile vegetation localized to the lateral leaflet of the tricuspid valve, with severe tricuspid regurgitation, so patient was recommended to go to the ED for treatment of endocarditis. Of note, patient currently smokes meth everyday. Last IV drug use of heroin/meth in February 2025. Has been off suboxone  for 8 months without heroin cravings.   Patient also endorses stable dyspnea on exertion mainly on hills, longer flat ground, and sometimes with talking. Also having intermittent chest pain associated with dyspnea. Patient denies fevers/chills, night sweats, nausea/vomiting, or abdominal pain. He also notes that his penicillin allergy was from childhood and is fine with a penicillin challenge.  Given the mobility of the mass and severity of valvular dysfunction, patient is being admitted for blood cultures, IV antibiotics, and possible TEE for definitive 3D localization and embolic risk assessment.    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  Past Surgical History  Past Surgical History:  Procedure Laterality Date   HERNIA REPAIR     KNEE SURGERY     TEE WITHOUT CARDIOVERSION N/A 11/14/2016   Procedure: TRANSESOPHAGEAL ECHOCARDIOGRAM (TEE);  Surgeon: [name], MD;  Location: [facility] ENDOSCOPY;  Service: Cardiovascular;  Laterality: N/A;    Social:  Lives With: Friend Occupation: Holiday Representative Support: Father Level of Function: Fully independent PCP: none Substances: Daily Meth user. Previous hx of IV drug use. Last IV use in February of 2025. Does not endorse current alcohol or tobacco use.   Family History: No pertinent family history  Allergies: Allergies as of 05/01/2024 - Review Complete 05/01/2024  Allergen Reaction Noted   Cephalosporins Hives 10/16/2016   Cipro  [ciprofloxacin  hcl] Hives and Itching 05/09/2021   Penicillins Hives, Shortness Of Breath, and Rash 10/16/2016   Cephalosporins  05/24/2022   Cephalosporins Other (  See Comments) 12/20/2023   Ciprofloxacin   05/24/2022   Penicillins  05/24/2022   Penicillins Other (See Comments) 12/20/2023    Review of Systems: A complete ROS was negative except as per HPI.   OBJECTIVE:   Physical Exam: Blood pressure (!) 161/103, pulse 98, temperature 98 F (36.7 C), temperature source Oral, resp. rate 18, height 6' 2 (1.88 m), weight 81.6 kg, SpO2 97%.  Constitutional: well-appearing male sitting in  bed, in no acute distress HENT: normocephalic atraumatic, mucous membranes moist Eyes: conjunctiva non-erythematous Cardiovascular: regular rate and rhythm, no m/r/g. 2+ DP pulses.  Pulmonary/Chest: normal work of breathing on room air, lungs clear to auscultation bilaterally Abdominal: soft, non-tender, non-distended MSK: swelling 1+ noted in bilateral feet/toes and fingers.  Skin: warm and dry. No Janeway lesions or Osler nodes.  Psych: Normal mood and affect  Labs: CBC    Component Value Date/Time   WBC 5.9 05/01/2024 0651   RBC 4.41 05/01/2024 0651   HGB 13.9 05/01/2024 0651   HGB 14.3 01/17/2022 1434   HCT 40.9 05/01/2024 0651   HCT 42.6 01/17/2022 1434   PLT 306 05/01/2024 0651   PLT 345 01/17/2022 1434   MCV 92.7 05/01/2024 0651   MCV 87 01/17/2022 1434   MCH 31.5 05/01/2024 0651   MCHC 34.0 05/01/2024 0651   RDW 13.1 05/01/2024 0651   RDW 13.8 01/17/2022 1434   LYMPHSABS 1.4 12/20/2023 1728   MONOABS 0.4 12/20/2023 1728   EOSABS 0.0 12/20/2023 1728   BASOSABS 0.0 12/20/2023 1728     CMP     Component Value Date/Time   NA 139 05/01/2024 0651   NA 140 01/17/2022 1434   K 4.3 05/01/2024 0651   CL 104 05/01/2024 0651   CO2 25 05/01/2024 0651   GLUCOSE 129 (H) 05/01/2024 0651   BUN 20 05/01/2024 0651   BUN 16 01/17/2022 1434   CREATININE 0.92 05/01/2024 0651   CREATININE 0.97 09/25/2017 1438   CALCIUM 9.4 05/01/2024 0651   PROT 6.6 05/01/2024 0745   PROT 6.9 01/17/2022 1434   ALBUMIN 4.3 05/01/2024 0745   ALBUMIN 4.7 01/17/2022 1434   AST 65 (H) 05/01/2024 0745   ALT 48 (H) 05/01/2024 0745   ALT 83 (H) 12/20/2016 1048   ALKPHOS 62 05/01/2024 0745   BILITOT 0.3 05/01/2024 0745   BILITOT 0.5 01/17/2022 1434   GFRNONAA >60 05/01/2024 0651   GFRAA 115 05/19/2020 1005    Imaging: CXR with no acute cardiopulmonary abnormality  EKG: personally reviewed my interpretation is NSR, right atrial enlargement, incomplete RBBB, nonspecific T wave  abnormality.  ASSESSMENT & PLAN:   Assessment & Plan by Problem: Principal Problem:   Vegetation of heart valve   ZYREN SEVIGNY is a 34 y.o. person living with a history of anxiety/depression, substance use (smokes meth), hepatitis C, HTN, MSSA bacteremia w/ endocarditis (2018) who presented with echo results concerning for endocarditis and admitted for treatment with IV antibiotics on hospital day 0   Tricuspid valve infective endocarditis Patient was hospitalized in August 2018 for infective endocarditis of tricuspid valve complicated by septic pulmonary emboli, osteomyelitis of the right foot, and MSSA bacteremia. Outpatient echo 04/30/2024 demonstrated a mobile vegetation on the lateral leaflet of tricuspid valve associated with severe tricuspid regurgitation and a dilated IVC, indicating significant valvular dysfunction and elevated right-sided filling pressures. High risk for septic pulmonary emboli given mobility of vegetation.  - continuous cardiac monitoring - not a surgical candidate due to current drug use, will clarify if  this was based on IV use or current smoking as well - Blood cultures x2, UA, and urine drug screen pending - started on empiric vancomycin  and cefepime  - Order TEE with 3D reconstruction for definitive localization, measurement of vegetation size, and assessment of perivalvular extension  Substance use disorder Current everyday meth smoker. Last IV heroin/meth use February 2025. Off suboxone  for 8 months, denies heroin cravings. At risk for withdrawal/agitation.  - Monitor for signs of withdrawal (fatigue, depression, agitation, psychosis - PRN anxiolytics/benzos  Hepatitis C Known history with mild transaminitis. Last Hep C RNA undetectable in 2023. - Trend LFTs - recheck hepatitis panel  Hypertension Blood pressure elevated on admission. Not on any home medications - monitor inpatient Bps  Anxiety/depression Denies current symptoms of  anxiety/depression. Not on any home medications  Diet: Normal VTE: Enoxaparin  IVF: None,None Code: Full  Prior to Admission Living Arrangement: Living with a friend Anticipated Discharge Location: Home Barriers to Discharge: IV Antibiotic course  Dispo: Admit patient to Inpatient with expected length of stay greater than 2 midnights.  Signed: Milburn Pennant, Medical Student  Please page on call intern or resident: First contact: [phone] If no answer in 15 minutes, please contact senior pager at [phone]  Attestation for Student Documentation:  I personally was present and re-performed the history, physical exam and medical decision-making activities of this service and have verified that the service and findings are accurately documented in the students note.  Jolaine Pac, DO 05/01/2024, 4:16 PM     [1]  No outpatient medications have been marked as taking for the 05/01/24 encounter [facility] Encounter).   "

## 2024-05-01 NOTE — Progress Notes (Signed)
 Pharmacy Antibiotic Note  Cody Rios is a 34 y.o. male admitted on 05/01/2024 with infective endocarditis.  Pharmacy has been consulted for vancomycin /cefepime  dosing.  Plan: S/p vancomycin  2000mg  IV x1 Vancomycin  1750mg  IV every 12 hours.  (eAUC 529.3, Vd 0.72, Scr 0.92)  Cefepime  2g IV q8h Follow up renal function and adjust as needed  Height: 6' 2 (188 cm) Weight: 81.6 kg (180 lb) IBW/kg (Calculated) : 82.2  Temp (24hrs), Avg:98.2 F (36.8 C), Min:98 F (36.7 C), Max:98.4 F (36.9 C)  Recent Labs  Lab 05/01/24 0651  WBC 5.9  CREATININE 0.92    Estimated Creatinine Clearance: 129.3 mL/min (by C-G formula based on SCr of 0.92 mg/dL).    Allergies[1]   Microbiology results: 1/28 BCx: pending  Thank you for allowing pharmacy to be a part of this patients care.  Cody Rios, PharmD PGY1 Clinical Pharmacist [facility]  05/01/2024 3:16 PM      [1]  Allergies Allergen Reactions   Cephalosporins Hives    Per patient, got hives as a baby   Cipro  [Ciprofloxacin  Hcl] Hives and Itching    Hives and itching locally at cipro  IV site    Penicillins Hives and Rash    **Per patient, got hives as a baby** Did it involve swelling of the face/tongue/throat, SOB, or low BP? No Did it involve sudden or severe rash/hives, skin peeling, or any reaction on the inside of your mouth or nose? No Did you need to seek medical attention at a hospital or doctor's office? Unknown When did it last happen?      Childhood If all above answers are NO, may proceed with cephalosporin use.

## 2024-05-01 NOTE — Progress Notes (Signed)
" ° °  [facility] has been requested to perform a transesophageal echocardiogram on Cody Rios for tricuspid valve vegetation.     The patient does NOT have any absolute or relative contraindications to a Transesophageal Echocardiogram (TEE).  The patient has: History of Severe Valve Disease (stenosis or regurgitation)    After careful review of history and examination, the risks and benefits of transesophageal echocardiogram have been explained including risks of esophageal damage, perforation (1:10,000 risk), bleeding, pharyngeal hematoma as well as other potential complications associated with conscious sedation including aspiration, arrhythmia, respiratory failure and death. Alternatives to treatment were discussed, questions were answered. Patient is willing to proceed.   Signed, Leontine LOISE Salen, PA-C  05/01/2024 11:11 AM   "

## 2024-05-01 NOTE — TOC CM/SW Note (Signed)
 TOC consult received for pcp information. Pcp information placed into AVS. Your Primary Care Provider information can be located on your Medicaid insurance card or by contacting Member Services at [phone].   Per notes, patient is homeless with a hx of substance abuse. Possible endocarditis, possible need for extended IV antibiotic treatment.   Follow-up to be completed with patient as appropriate.   Merilee Batty, MSN, RN Case Management [phone]

## 2024-05-01 NOTE — ED Notes (Signed)
 Pt encouraged to produce urin sample.

## 2024-05-01 NOTE — ED Notes (Signed)
 Patient transported to floor.

## 2024-05-01 NOTE — Consult Note (Signed)
 Telemetry notified, box number 15. NSR

## 2024-05-01 NOTE — Progress Notes (Signed)
 ED Pharmacy Antibiotic Sign Off An antibiotic consult was received from an ED provider for vancomycin  and cefepime  per pharmacy dosing for endocarditis (native tricuspid valve). A chart review was completed to assess appropriateness.   The following one time order(s) were placed:  Vancomycin  2g Cefepime  2g  Further antibiotic and/or antibiotic pharmacy consults should be ordered by the admitting provider if indicated.   Thank you for allowing pharmacy to be a part of this patient's care.   Leonor GORMAN Bash, [facility]  Clinical Pharmacist 05/01/24 7:19 AM

## 2024-05-02 ENCOUNTER — Inpatient Hospital Stay (HOSPITAL_COMMUNITY): Admitting: Anesthesiology

## 2024-05-02 ENCOUNTER — Observation Stay (HOSPITAL_COMMUNITY): Admit: 2024-05-02 | Discharge: 2024-05-02 | Disposition: A | Discharge status: Home / Self Care

## 2024-05-02 ENCOUNTER — Encounter (HOSPITAL_COMMUNITY)
Admission: EM | Disposition: A | Discharge status: Home / Self Care | Payer: Self-pay | Source: Home / Self Care | Attending: Infectious Diseases

## 2024-05-02 ENCOUNTER — Encounter (HOSPITAL_COMMUNITY): Payer: Self-pay | Admitting: Infectious Diseases

## 2024-05-02 DIAGNOSIS — F418 Other specified anxiety disorders: Secondary | ICD-10-CM

## 2024-05-02 DIAGNOSIS — B1920 Unspecified viral hepatitis C without hepatic coma: Secondary | ICD-10-CM | POA: Diagnosis not present

## 2024-05-02 DIAGNOSIS — Z8679 Personal history of other diseases of the circulatory system: Secondary | ICD-10-CM

## 2024-05-02 DIAGNOSIS — I10 Essential (primary) hypertension: Secondary | ICD-10-CM

## 2024-05-02 DIAGNOSIS — Z5329 Procedure and treatment not carried out because of patient's decision for other reasons: Secondary | ICD-10-CM | POA: Diagnosis present

## 2024-05-02 DIAGNOSIS — I330 Acute and subacute infective endocarditis: Secondary | ICD-10-CM | POA: Diagnosis present

## 2024-05-02 DIAGNOSIS — Z8619 Personal history of other infectious and parasitic diseases: Secondary | ICD-10-CM | POA: Diagnosis not present

## 2024-05-02 DIAGNOSIS — Z8659 Personal history of other mental and behavioral disorders: Secondary | ICD-10-CM

## 2024-05-02 DIAGNOSIS — I071 Rheumatic tricuspid insufficiency: Secondary | ICD-10-CM | POA: Diagnosis present

## 2024-05-02 DIAGNOSIS — I079 Rheumatic tricuspid valve disease, unspecified: Secondary | ICD-10-CM

## 2024-05-02 DIAGNOSIS — I38 Endocarditis, valve unspecified: Secondary | ICD-10-CM | POA: Diagnosis not present

## 2024-05-02 DIAGNOSIS — F419 Anxiety disorder, unspecified: Secondary | ICD-10-CM | POA: Diagnosis present

## 2024-05-02 DIAGNOSIS — F1510 Other stimulant abuse, uncomplicated: Secondary | ICD-10-CM | POA: Diagnosis present

## 2024-05-02 DIAGNOSIS — R7881 Bacteremia: Secondary | ICD-10-CM

## 2024-05-02 DIAGNOSIS — Z86711 Personal history of pulmonary embolism: Secondary | ICD-10-CM | POA: Diagnosis not present

## 2024-05-02 DIAGNOSIS — Z880 Allergy status to penicillin: Secondary | ICD-10-CM | POA: Diagnosis not present

## 2024-05-02 DIAGNOSIS — Z881 Allergy status to other antibiotic agents status: Secondary | ICD-10-CM | POA: Diagnosis not present

## 2024-05-02 DIAGNOSIS — F32A Depression, unspecified: Secondary | ICD-10-CM | POA: Diagnosis present

## 2024-05-02 LAB — COMPREHENSIVE METABOLIC PANEL WITH GFR
ALT: 36 U/L (ref 0–44)
AST: 36 U/L (ref 15–41)
Albumin: 3.8 g/dL (ref 3.5–5.0)
Alkaline Phosphatase: 52 U/L (ref 38–126)
Anion gap: 10 (ref 5–15)
BUN: 18 mg/dL (ref 6–20)
CO2: 28 mmol/L (ref 22–32)
Calcium: 8.8 mg/dL — ABNORMAL LOW (ref 8.9–10.3)
Chloride: 104 mmol/L (ref 98–111)
Creatinine, Ser: 1.01 mg/dL (ref 0.61–1.24)
GFR, Estimated: >60 mL/min
Glucose, Bld: 107 mg/dL — ABNORMAL HIGH (ref 70–99)
Potassium: 3.9 mmol/L (ref 3.5–5.1)
Sodium: 143 mmol/L (ref 135–145)
Total Bilirubin: 0.4 mg/dL (ref 0.0–1.2)
Total Protein: 5.9 g/dL — ABNORMAL LOW (ref 6.5–8.1)

## 2024-05-02 LAB — HCV RNA QUANT RFLX ULTRA OR GENOTYP
HCV RNA Qnt(log copy/mL): UNDETERMINED {Log_IU}/mL
HepC Qn: NOT DETECTED [IU]/mL

## 2024-05-02 LAB — CBC
HCT: 41.2 % (ref 39.0–52.0)
Hemoglobin: 13.7 g/dL (ref 13.0–17.0)
MCH: 31.1 pg (ref 26.0–34.0)
MCHC: 33.3 g/dL (ref 30.0–36.0)
MCV: 93.4 fL (ref 80.0–100.0)
Platelets: 262 10*3/uL (ref 150–400)
RBC: 4.41 MIL/uL (ref 4.22–5.81)
RDW: 13 % (ref 11.5–15.5)
WBC: 5.8 10*3/uL (ref 4.0–10.5)
nRBC: 0 % (ref 0.0–0.2)

## 2024-05-02 LAB — URINALYSIS, ROUTINE W REFLEX MICROSCOPIC
Bilirubin Urine: NEGATIVE
Glucose, UA: NEGATIVE mg/dL
Hgb urine dipstick: NEGATIVE
Ketones, ur: NEGATIVE mg/dL
Nitrite: NEGATIVE
Protein, ur: NEGATIVE mg/dL
Specific Gravity, Urine: 1.027 (ref 1.005–1.030)
pH: 6 (ref 5.0–8.0)

## 2024-05-02 LAB — URINE DRUG SCREEN
Amphetamines: POSITIVE — AB
Barbiturates: NEGATIVE
Benzodiazepines: NEGATIVE
Cocaine: NEGATIVE
Fentanyl: NEGATIVE
Methadone Scn, Ur: NEGATIVE
Opiates: NEGATIVE
Tetrahydrocannabinol: POSITIVE — AB

## 2024-05-02 LAB — SYPHILIS: RPR W/REFLEX TO RPR TITER AND TREPONEMAL ANTIBODIES, TRADITIONAL SCREENING AND DIAGNOSIS ALGORITHM: RPR Ser Ql: NONREACTIVE

## 2024-05-02 LAB — HEPATITIS B SURFACE ANTIBODY, QUANTITATIVE: Hep B S AB Quant (Post): 360 m[IU]/mL

## 2024-05-02 MED ORDER — DEXMEDETOMIDINE HCL IN NACL 80 MCG/20ML IV SOLN
INTRAVENOUS | Status: DC | PRN
  Administered 2024-05-02: 4 ug via INTRAVENOUS
  Administered 2024-05-02 (×2): 8 ug via INTRAVENOUS

## 2024-05-02 MED ORDER — PROPOFOL 500 MG/50ML IV EMUL
INTRAVENOUS | Status: DC | PRN
  Administered 2024-05-02: 200 ug/kg/min via INTRAVENOUS
  Administered 2024-05-02: 150 ug/kg/min via INTRAVENOUS

## 2024-05-02 MED ORDER — SODIUM CHLORIDE 0.9 % IV SOLN: INTRAVENOUS | Status: DC | PRN

## 2024-05-02 MED ORDER — BISMUTH SUBSALICYLATE 262 MG/15ML PO SUSP
30.0000 mL | Freq: Once | ORAL | Status: AC
  Filled 2024-05-02: qty 236

## 2024-05-02 MED ORDER — PROPOFOL 10 MG/ML IV BOLUS
INTRAVENOUS | Status: DC | PRN
  Administered 2024-05-02 (×2): 20 mg via INTRAVENOUS
  Administered 2024-05-02: 50 mg via INTRAVENOUS
  Administered 2024-05-02 (×3): 30 mg via INTRAVENOUS
  Administered 2024-05-02: 20 mg via INTRAVENOUS

## 2024-05-02 MED ORDER — SODIUM CHLORIDE 0.45 % IV SOLN: INTRAVENOUS | Status: DC

## 2024-05-02 MED ORDER — PANTOPRAZOLE SODIUM 20 MG PO TBEC
20.0000 mg | DELAYED_RELEASE_TABLET | Freq: Every day | ORAL | Status: DC | PRN
  Filled 2024-05-02: qty 1

## 2024-05-02 NOTE — Anesthesia Preprocedure Evaluation (Signed)
 "                                  Anesthesia Evaluation  Patient identified by MRN, date of birth, ID bandGeneral Assessment Comment:Somnolent, poorly responsive to questions and arousal attempts  Reviewed: Allergy & Precautions, NPO status , Patient's Chart, lab work & pertinent test results  Airway Mallampati: II  TM Distance: >3 FB Neck ROM: Full    Dental no notable dental hx.    Pulmonary    Pulmonary exam normal        Cardiovascular hypertension,  Rhythm:Regular Rate:Normal  - severe TR 2/2 TV endocarditis  IMPRESSIONS    1. Left ventricular ejection fraction, by estimation, is 60 to 65%. The left ventricle has normal function. The left ventricle has no regional wall motion abnormalities. Left ventricular diastolic parameters were normal. The average left ventricular global longitudinal strain is -18.9 %. The global longitudinal strain is normal.  2. Right ventricular systolic function is normal. The right ventricular size is normal.  3. The mitral valve is normal in structure. No evidence of mitral valve regurgitation. No evidence of mitral stenosis.  4. Mobile vegetation ? on lateral leaflet no 3D imaging performed to localize. Tricuspid valve regurgitation is severe.  5. The aortic valve is normal in structure. Aortic valve regurgitation is not visualized. No aortic stenosis is present.  6. Aortic dilatation noted. There is mild dilatation of the aortic root, measuring 38 mm.  7. The inferior vena cava is dilated in size with >50% respiratory variability, suggesting right atrial pressure of 8 mmHg.    Neuro/Psych   Anxiety Depression    negative neurological ROS     GI/Hepatic ,,,(+)     substance abuse  alcohol use, marijuana use, methamphetamine use and IV drug use, Hepatitis -, C  Endo/Other    Renal/GU   negative genitourinary   Musculoskeletal  (+)  narcotic dependent  Abdominal Normal abdominal exam  (+)   Peds  Hematology Lab Results       Component                Value               Date                      WBC                      5.8                 05/02/2024                HGB                      13.7                05/02/2024                HCT                      41.2                05/02/2024                MCV                      93.4  05/02/2024                PLT                      262                 05/02/2024              Anesthesia Other Findings   Reproductive/Obstetrics                              Anesthesia Physical Anesthesia Plan  ASA: 3  Anesthesia Plan: MAC   Post-op Pain Management:    Induction: Intravenous  PONV Risk Score and Plan: Propofol  infusion and Treatment may vary due to age or medical condition  Airway Management Planned: Simple Face Mask and Nasal Cannula  Additional Equipment: None  Intra-op Plan:   Post-operative Plan:   Informed Consent: I have reviewed the patients History and Physical, chart, labs and discussed the procedure including the risks, benefits and alternatives for the proposed anesthesia with the patient or authorized representative who has indicated his/her understanding and acceptance.     Dental advisory given  Plan Discussed with: CRNA  Anesthesia Plan Comments:         Anesthesia Quick Evaluation  "

## 2024-05-02 NOTE — Interval H&P Note (Signed)
 History and Physical Interval Note:  05/02/2024 11:22 AM  Cody Rios  has presented today for surgery, with the diagnosis of bacteremia.  The various methods of treatment have been discussed with the patient and family. After consideration of risks, benefits and other options for treatment, the patient has consented to  Procedures: TRANSESOPHAGEAL ECHOCARDIOGRAM (N/A) as a surgical intervention.  The patient's history has been reviewed, patient examined, no change in status, stable for surgery.  I have reviewed the patient's chart and labs.  Questions were answered to the patient's satisfaction.    Informed Consent   Shared Decision Making/Informed Consent   The risks [esophageal damage, perforation (1:10,000 risk), bleeding, pharyngeal hematoma as well as other potential complications associated with conscious sedation including aspiration, arrhythmia, respiratory failure and death], benefits (treatment guidance and diagnostic support) and alternatives of a transesophageal echocardiogram were discussed in detail with [name] and he is willing to proceed.      Contact person: Gram Siedlecki [phone]  Madonna Large, DO, [facility]  A Division of [facility] [address], [place], [place] [address]

## 2024-05-02 NOTE — Anesthesia Postprocedure Evaluation (Signed)
"   Anesthesia Post Note  Patient: Cody Rios  Procedure(s) Performed: TRANSESOPHAGEAL ECHOCARDIOGRAM     Patient location during evaluation: PACU Anesthesia Type: MAC Level of consciousness: awake and alert Pain management: pain level controlled Vital Signs Assessment: post-procedure vital signs reviewed and stable Respiratory status: spontaneous breathing, nonlabored ventilation, respiratory function stable and patient connected to nasal cannula oxygen Cardiovascular status: stable and blood pressure returned to baseline Postop Assessment: no apparent nausea or vomiting Anesthetic complications: no   No notable events documented.  Last Vitals:  Vitals:   05/02/24 1432 05/02/24 1442  BP: 112/69 120/68  Pulse: 67 65  Resp: 13 11  Temp:    SpO2: 96% 99%    Last Pain:  Vitals:   05/02/24 1442  TempSrc:   PainSc: 0-No pain                 Cordella P Kenzi Bardwell      "

## 2024-05-02 NOTE — Anesthesia Procedure Notes (Signed)
 Procedure Name: MAC Date/Time: 05/02/2024 1:19 PM  Performed by: Claudene Arlin LABOR, CRNAPre-anesthesia Checklist: Patient identified, Emergency Drugs available, Suction available and Patient being monitored Patient Re-evaluated:Patient Re-evaluated prior to induction Oxygen Delivery Method: Nasal cannula Induction Type: IV induction Airway Equipment and Method: Bite block Placement Confirmation: positive ETCO2 Dental Injury: Teeth and Oropharynx as per pre-operative assessment

## 2024-05-02 NOTE — Transfer of Care (Signed)
 Immediate Anesthesia Transfer of Care Note  Patient: Cody Rios  Procedure(s) Performed: TRANSESOPHAGEAL ECHOCARDIOGRAM  Patient Location: Cath Lab  Anesthesia Type:MAC  Level of Consciousness: sedated  Airway & Oxygen Therapy: Patient Spontanous Breathing  Post-op Assessment: Report given to RN and Post -op Vital signs reviewed and stable  Post vital signs: Reviewed and stable  Last Vitals:  Vitals Value Taken Time  BP 109/64 05/02/24 14:12  Temp 36.6 C 05/02/24 14:12  Pulse 79 05/02/24 14:14  Resp 13 05/02/24 14:14  SpO2 96 % 05/02/24 14:14  Vitals shown include unfiled device data.  Last Pain:  Vitals:   05/02/24 1412  TempSrc: Tympanic  PainSc: Asleep      Patients Stated Pain Goal: 0 (05/02/24 0355)  Complications: No notable events documented.

## 2024-05-02 NOTE — Progress Notes (Signed)
" °  Echocardiogram Echocardiogram Transesophageal has been performed.  LAMON MAXWELL 05/02/2024, 2:12 PM "

## 2024-05-02 NOTE — Progress Notes (Signed)
 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    Overnight Events: Ordered pepto-bismol for heartburn at 12:30AM  Interim Events: Patient awoken from sleep, in no acute distress. Denies any agitation, aggravation, anxiety which are his general withdrawal symptoms from previous heroin withdrawal. Additionally denies hallucinations. Having some minor generalized abdominal pain and some itching from antibiotics (documented allergy to cephalosporin), but otherwise feeling okay.    Objective:  Vital signs in last 24 hours: Vitals:   05/01/24 2115 05/01/24 2157 05/02/24 0415 05/02/24 0500  BP: 130/80 136/72 (!) 157/82   Pulse: 84 77 69   Resp: 16     Temp:  (!) 97.4 F (36.3 C) 97.8 F (36.6 C)   TempSrc:   Oral   SpO2: 100% 100% 100%   Weight:    80 kg  Height:       Supplemental O2: Room Air SpO2: 100 %   Physical Exam:  Constitutional: well-appearing male sitting in bed, in no acute distress Eyes: conjunctiva non-erythematous Cardiovascular: regular rate and rhythm, systolic murmur at left lower sternal border  Pulmonary/Chest: normal work of breathing on room air, lungs clear to auscultation bilaterally Abdominal: soft, non-tender, non-distended Skin: warm and dry Psych: Normal mood and affect  Filed Weights   05/01/24 0637 05/02/24 0500  Weight: 81.6 kg 80 kg     Intake/Output Summary (Last 24 hours) at 05/02/2024 0655 Last data filed at 05/02/2024 0000 Gross per 24 hour  Intake 300 ml  Output --  Net 300 ml   Net IO Since Admission: 300 mL [05/02/24 0655]  Pertinent Labs:    Latest Ref Rng & Units 05/02/2024    2:46 AM 05/01/2024    6:51 AM 12/20/2023     5:28 PM  CBC  WBC 4.0 - 10.5 K/uL 5.8  5.9  6.1   Hemoglobin 13.0 - 17.0 g/dL 86.2  86.0  85.8   Hematocrit 39.0 - 52.0 % 41.2  40.9  42.9   Platelets 150 - 400 K/uL 262  306  285        Latest Ref Rng & Units 05/02/2024    2:46 AM 05/01/2024    7:45 AM 05/01/2024    6:51 AM  CMP  Glucose 70 - 99 mg/dL 892   870   BUN 6 - 20 mg/dL 18   20   Creatinine 9.38 - 1.24 mg/dL 8.98   9.07   Sodium 864 - 145 mmol/L 143   139   Potassium 3.5 - 5.1 mmol/L 3.9   4.3   Chloride 98 - 111 mmol/L 104   104   CO2 22 - 32 mmol/L 28   25   Calcium 8.9 - 10.3 mg/dL 8.8   9.4   Total Protein 6.5 - 8.1 g/dL 5.9  6.6    Total Bilirubin 0.0 - 1.2 mg/dL 0.4  0.3    Alkaline Phos 38 - 126 U/L 52  62    AST 15 - 41 U/L 36  65    ALT 0 - 44 U/L 36  48      Imaging: DG Chest 2 View Result Date: 05/01/2024 EXAM: 2 VIEW(S) XRAY OF THE CHEST 05/01/2024 06:58:00 AM COMPARISON: 12/20/2023 CLINICAL HISTORY: Chest pain. FINDINGS:  LUNGS AND PLEURA: No focal pulmonary opacity. No pleural effusion. No pneumothorax. HEART AND MEDIASTINUM: No acute abnormality of the cardiac and mediastinal silhouettes. BONES AND SOFT TISSUES: No acute osseous abnormality. IMPRESSION: 1. No acute cardiopulmonary pathology. 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   #Tricuspid valve endocarditis #History of IVDU (meth, heroin) #History of MSSA tricuspid valve endocarditis (2018) History of prior MSSA tricuspid valve  endocarditis (2018) now with mobile tricuspid valve vegetation and severe tricuspid regurgitation on TTE. Afebrile with normal WBC, but imaging concerning for active infection. Blood cultures pending. Patient has childhood allergy to penicillins and cephalosporins but has been tolerating the antibiotics well with only minor itching that has since resolved.  Culture Data:  - 1/28 blood cultures: pending Antibiotic Data:  - IV Vanc (i1/28 - )  - IV Cefepime  (i1/28 - )  - not a surgical candidate due to current drug use, will clarify if this was based on IV use or current smoking as well after TEE - f/u TEE with 3D reconstruction for definitive localization, measurement of vegetation size, and assessment of perivalvular extension scheduled today at 12   #Substance use disorder (meth, THC; prior cocaine, heroin) #History of IVDU (meth, heroin)  Current everyday meth smoker. Last IV heroin/meth use February 2025. Off suboxone  for 8 months, denies heroin cravings. Currently without any signs/symptoms of withdrawal  - Monitor for signs of withdrawal (fatigue, depression, anxiety agitation, psychosis) - PRN anxiolytics/benzos - urine drug screen positive for amphetamines and THC - screen for HIV, syphilis, gonorrhea/chlamydia pending   #Hepatitis C Known history with mild transaminitis. Last Hep C RNA undetectable in 2023. Hep A/B immune this admission.  - Trend LFTs - Hep C RNA pending   #Hypertension Known history of hypertension, blood pressure elevated on admission. Not on any home medications - monitor inpatient Bps   #Anxiety #Depression Denies current symptoms of anxiety/depression. Not on any home medications - monitor for anxiety/depression in the setting of withdrawal  Diet: Normal IVF: 1/2 NS,10cc/hr VTE: Enoxaparin  Code: Full PT/OT recs: None, none.   Dispo: [facility] in more than 2 days pending completion of antibiotic course.    Nonda Carrie, MS3   Attestation for Student Documentation:  I personally was present and re-performed the history, physical exam and medical decision-making activities of this service and have verified that the service and findings are accurately documented in the students note.  [name], MD 05/02/2024, 10:54 AM  Please contact the on call pager after 5 pm and on weekends at [phone].  "

## 2024-05-03 DIAGNOSIS — F32A Depression, unspecified: Secondary | ICD-10-CM | POA: Diagnosis not present

## 2024-05-03 DIAGNOSIS — I079 Rheumatic tricuspid valve disease, unspecified: Secondary | ICD-10-CM | POA: Diagnosis not present

## 2024-05-03 DIAGNOSIS — I10 Essential (primary) hypertension: Secondary | ICD-10-CM | POA: Diagnosis not present

## 2024-05-03 DIAGNOSIS — B1920 Unspecified viral hepatitis C without hepatic coma: Secondary | ICD-10-CM | POA: Diagnosis not present

## 2024-05-03 LAB — MISC LABCORP TEST (SEND OUT): Labcorp test code: 83935

## 2024-05-03 LAB — ECHO TEE
AR max vel: 3.94 cm2
AV Area VTI: 3.65 cm2
AV Area mean vel: 3.14 cm2
AV Mean grad: 3 mmHg
AV Peak grad: 4.8 mmHg
Ao pk vel: 1.1 m/s

## 2024-05-03 LAB — CBC
HCT: 45.1 % (ref 39.0–52.0)
Hemoglobin: 15.3 g/dL (ref 13.0–17.0)
MCH: 31.3 pg (ref 26.0–34.0)
MCHC: 33.9 g/dL (ref 30.0–36.0)
MCV: 92.2 fL (ref 80.0–100.0)
Platelets: 275 10*3/uL (ref 150–400)
RBC: 4.89 MIL/uL (ref 4.22–5.81)
RDW: 12.8 % (ref 11.5–15.5)
WBC: 6 10*3/uL (ref 4.0–10.5)
nRBC: 0 % (ref 0.0–0.2)

## 2024-05-03 LAB — COMPREHENSIVE METABOLIC PANEL WITH GFR
ALT: 35 U/L (ref 0–44)
AST: 32 U/L (ref 15–41)
Albumin: 3.9 g/dL (ref 3.5–5.0)
Alkaline Phosphatase: 55 U/L (ref 38–126)
Anion gap: 8 (ref 5–15)
BUN: 14 mg/dL (ref 6–20)
CO2: 28 mmol/L (ref 22–32)
Calcium: 8.8 mg/dL — ABNORMAL LOW (ref 8.9–10.3)
Chloride: 101 mmol/L (ref 98–111)
Creatinine, Ser: 0.77 mg/dL (ref 0.61–1.24)
GFR, Estimated: >60 mL/min
Glucose, Bld: 104 mg/dL — ABNORMAL HIGH (ref 70–99)
Potassium: 4.3 mmol/L (ref 3.5–5.1)
Sodium: 137 mmol/L (ref 135–145)
Total Bilirubin: 0.3 mg/dL (ref 0.0–1.2)
Total Protein: 6.2 g/dL — ABNORMAL LOW (ref 6.5–8.1)

## 2024-05-03 LAB — URINE CYTOLOGY ANCILLARY ONLY
Chlamydia: NEGATIVE
Comment: NEGATIVE
Comment: NORMAL
Neisseria Gonorrhea: NEGATIVE

## 2024-05-03 MED ORDER — SENNA 8.6 MG PO TABS: 1.0000 | ORAL_TABLET | Freq: Every day | ORAL | 0 refills | Status: AC

## 2024-05-03 MED ORDER — SENNA 8.6 MG PO TABS
1.0000 | ORAL_TABLET | Freq: Every day | ORAL | Status: DC
  Administered 2024-05-03: 8.6 mg via ORAL
  Filled 2024-05-03: qty 1

## 2024-05-03 MED ORDER — POLYETHYLENE GLYCOL 3350 17 G PO PACK: 17.0000 g | PACK | Freq: Every day | ORAL | 0 refills | Status: AC

## 2024-05-03 MED ORDER — POLYETHYLENE GLYCOL 3350 17 G PO PACK
17.0000 g | PACK | Freq: Every day | ORAL | Status: DC
  Administered 2024-05-03: 17 g via ORAL
  Filled 2024-05-03: qty 1

## 2024-05-03 NOTE — Plan of Care (Signed)
   Problem: Health Behavior/Discharge Planning: Goal: Ability to manage health-related needs will improve Outcome: Progressing   Problem: Clinical Measurements: Goal: Ability to maintain clinical measurements within normal limits will improve Outcome: Progressing Goal: Will remain free from infection Outcome: Progressing Goal: Diagnostic test results will improve Outcome: Progressing Goal: Cardiovascular complication will be avoided Outcome: Progressing

## 2024-05-03 NOTE — Discharge Summary (Signed)
 "  Name: Cody Rios MRN: [id number] DOB: 02-10-1991 34 y.o. PCP: Pcp, No  Date of Admission: 05/01/2024  6:24 AM Date of Discharge:  Attending Physician: Eben Reyes BROCKS, MD   Patient was discharged Against Medical Advice  Discharge Diagnosis: 1. Concern for Tricuspid Vegetation of Heart Valve 2. Possible Infective Endocarditis 3. Severe Tricuspid Regurgitation 4. Substance Use Disorder (Methamphetamine abuse) 5. Hypertension 6. History of anxiety and depression 7. Transaminitis 8. History of IVDU (meth, heroin)  9. History of positive HCV antibody   Discharge Medications: Allergies as of 05/03/2024       Reactions   Cephalosporins Hives   Per patient, got hives as a baby   Cipro  [ciprofloxacin  Hcl] Hives, Itching   Hives and itching locally at cipro  IV site    Penicillins Hives, Rash   **Per patient, got hives as a baby** Did it involve swelling of the face/tongue/throat, SOB, or low BP? No Did it involve sudden or severe rash/hives, skin peeling, or any reaction on the inside of your mouth or nose? No Did you need to seek medical attention at a hospital or doctor's office? Unknown When did it last happen?      Childhood If all above answers are NO, may proceed with cephalosporin use.        Medication List     STOP taking these medications    Descovy 200-25 MG tablet Generic drug: emtricitabine-tenofovir AF       TAKE these medications    doxycycline  100 MG tablet Commonly known as: VIBRA -TABS Take 100 mg by mouth 2 (two) times daily.   polyethylene glycol 17 g packet Commonly known as: MIRALAX  / GLYCOLAX  Take 17 g by mouth daily. Start taking on: May 04, 2024   senna 8.6 MG Tabs tablet Commonly known as: SENOKOT Take 1 tablet (8.6 mg total) by mouth daily. Start taking on: May 04, 2024        Disposition and follow-up:   Cody Rios was discharged from [facility] in Stable condition.  At the  hospital follow up visit please address:  1.  Please follow up the final results of the patient's blood cultures and TEE for definitive diagnosis. NGTD x 48hrs, however final TEE pending at time of AMA.   2.  Labs / imaging needed at time of follow-up: CBC (to rule out new or developing leukocytosis), CMP (to monitor transaminitis)  3.  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   #Tricuspid valve endocarditis #History of IVDU (meth, heroin) Patient was hospitalized in August 2018 for infective endocarditis of tricuspid valve, which was complicated by septic pulmonary emboli, osteomyelitis of the right foot, and MSSA bacteremia. Patient received an outpatient echo on 04/30/2024, which demonstrated a mobile vegetation on the lateral leaflet of tricuspid valve associated with severe tricuspid regurgitation and a dilated IVC, indicating significant valvular dysfunction and elevated right-sided filling pressures. Patient not a surgical candidate due to current drug use. Placed on telemetry. Afebrile, no leukocytosis during admission. Empiric Vancomycin  and Cefepime  per pharmacy initiated on 05/01/24. Patient has childhood allergy to penicillins and cephalosporins, however tolerated antibiotics well with only minor itching not requiring a PRN anti-histamine.  Blood cultures showed no growth x 48 hours. TEE with 3D reconstruction for definitive localization, measurement of vegetation size, and assessment of perivalvular extension was ordered, demonstrating severe tricuspid regurgitation due to flail vs perforated anterior leaflet. Small  echodensity on leaflet is likely structural valve damage from prior vegetation or redundant leaflet tissue. No definitive vegetation or abscess and blood cultures remain negative at 48 hours, making active infective endocarditis unlikely. Pending final TEE read, the patient decided to leave against medical advice from [facility]. He stated he currently has a friend watching his cats, who was unable to get the generator started to provide proper heat to where the patient was previously staying. He was concerned for the well-being and safety of his cats and wanted to leave. Discussed the risks of leaving the hospital without definitive treatment, including abscesses, strokes, heart failure, or recurrence of his presentation. Discussed the benefits and further cardiology evaluation to rule out if his presentation is not true endocarditis. Patient voiced understanding and elected to leave the hospital and appropriately signed AMA documentation with nursing staff.     #Substance use disorder (meth; prior THC, cocaine, heroin) #History of IVDU (meth, heroin)  Current everyday meth smoker. Last IV heroin/meth use February 2025. Off suboxone  for 8 months, denied heroin cravings while inpatient. UDS positive for amphetamines, THC. HIV, syphilis, urine G/C negative. Patient follows with health department. No signs or symptoms of withdrawal during this admission other than mild anxiety. As needed anxiolytics/benzodiazepines ordered for withdrawal symptoms.    #Hepatitis C Known history with mild transaminitis. Last Hep C RNA undetectable in 2023, Hep C RNA negative Hepatitis A and B immune this admission. Trended LFTs with CMP.  #Hypertension Blood pressure elevated on admission with known history of hypertension. Not on any home medications. Monitored while inpatient.    #Anxiety/depression Denied current symptoms of anxiety/depression on admission. Not on any home medications. Monitored for anxiety/depression  in setting of withdrawal.  Discharge Exam:   BP (!) 163/85 (BP Location: Left Arm)   Pulse 94   Temp (!) 97.5 F (36.4 C)   Resp 17   Ht 6' 2 (1.88 m)   Wt 84.9 kg   SpO2 100%   BMI 24.03 kg/m  Discharge exam:  Constitutional: well-appearing male sitting in bed, in no acute distress Eyes: conjunctiva non-erythematous Cardiovascular: regular rate and rhythm, systolic murmur at left lower sternal border  Pulmonary/Chest: normal work of breathing on room air, lungs clear to auscultation bilaterally Abdominal: soft, non-tender, non-distended Skin: warm and dry, no LE edema Psych: Normal mood and affect  Pertinent Labs, Studies, and Procedures:   Negative HIV, Syphilis, or urinary G/C tests Mild Transaminitis  Hep A/B immune Hep C RNA negative  Discharge Instructions: Your Primary Care Provider information can be located on your Medicaid insurance card or by contacting Member Services at [phone].   Cody Rios,  You were recently admitted to [facility] for a concern for endocarditis, which is inflammation of the inside lining of your heart, as well as the heart valves. To treat this, you were given IV antibiotics. Based on the ultrasound of your heart (TEE), it is unclear if you fully have endocarditis. You were found to have a change on one of the valves of your heart, however more workup is needed to know how involved this change in your heart valve is, what exactly it is, and how large it is. The cardiology (heart doctor) team would be the doctors to have  more clarifying answers, however, this was unable to be done due to your choice of leaving the hospital against medical advice.   Continue taking your home medications:  Continue taking Miralax , Senna for constipation. Continue taking doxycycline  for your chlamydia infection.    You should seek further medical care if you develop any fever over 101F, any chest pains or shortness of breath, or any  changes in your mental status/new confusion, or loss of strength in your arms or legs.   We recommend that you see a primary care doctor in about a week to make sure that you continue to improve or return to the Emergency Room if you begin to develop any of the above symptoms. For now, please follow up with the health department until you are able to see a primary doctor.    Sincerely, Warrick Llera, DO   Signed: Cherly Erno 05/03/2024, 11:23 PM   Pager: [phone] "

## 2024-05-03 NOTE — Plan of Care (Signed)
   Problem: Education: Goal: Knowledge of General Education information will improve Description Including pain rating scale, medication(s)/side effects and non-pharmacologic comfort measures Outcome: Progressing

## 2024-05-06 ENCOUNTER — Encounter: Payer: Self-pay | Admitting: Cardiovascular Disease

## 2024-05-06 LAB — CULTURE, BLOOD (ROUTINE X 2)
Culture: NO GROWTH
Culture: NO GROWTH
Culture: NO GROWTH
Special Requests: ADEQUATE
Special Requests: ADEQUATE
